# Patient Record
Sex: Female | Born: 1935 | Race: White | Hispanic: No | Smoking: Never smoker
Health system: Southern US, Community
[De-identification: ages and names within clinical notes are randomized; demographics above are authoritative.]

## PROBLEM LIST (undated history)

## (undated) DIAGNOSIS — N813 Complete uterovaginal prolapse: Secondary | ICD-10-CM

## (undated) DIAGNOSIS — E871 Hypo-osmolality and hyponatremia: Secondary | ICD-10-CM

## (undated) DIAGNOSIS — IMO0002 Reserved for concepts with insufficient information to code with codable children: Secondary | ICD-10-CM

## (undated) DIAGNOSIS — M1990 Unspecified osteoarthritis, unspecified site: Secondary | ICD-10-CM

## (undated) DIAGNOSIS — I10 Essential (primary) hypertension: Secondary | ICD-10-CM

## (undated) DIAGNOSIS — M810 Age-related osteoporosis without current pathological fracture: Secondary | ICD-10-CM

## (undated) HISTORY — PX: NO PAST SURGERIES: SHX2092

## (undated) HISTORY — DX: Complete uterovaginal prolapse: N81.3

## (undated) HISTORY — DX: Reserved for concepts with insufficient information to code with codable children: IMO0002

## (undated) HISTORY — DX: Unspecified osteoarthritis, unspecified site: M19.90

## (undated) HISTORY — DX: Essential (primary) hypertension: I10

## (undated) HISTORY — DX: Hypo-osmolality and hyponatremia: E87.1

## (undated) HISTORY — DX: Age-related osteoporosis without current pathological fracture: M81.0

---

## 2005-03-15 ENCOUNTER — Ambulatory Visit: Payer: Self-pay | Admitting: Internal Medicine

## 2006-03-28 ENCOUNTER — Ambulatory Visit: Payer: Self-pay | Admitting: Internal Medicine

## 2006-07-29 ENCOUNTER — Ambulatory Visit: Payer: Self-pay | Admitting: Unknown Physician Specialty

## 2006-09-18 ENCOUNTER — Emergency Department: Payer: Self-pay | Admitting: Emergency Medicine

## 2006-12-04 ENCOUNTER — Ambulatory Visit: Payer: Self-pay | Admitting: Internal Medicine

## 2006-12-18 ENCOUNTER — Ambulatory Visit: Payer: Self-pay | Admitting: Physician Assistant

## 2007-04-16 ENCOUNTER — Ambulatory Visit: Payer: Self-pay | Admitting: Internal Medicine

## 2008-04-19 ENCOUNTER — Ambulatory Visit: Payer: Self-pay | Admitting: Obstetrics and Gynecology

## 2009-05-05 ENCOUNTER — Ambulatory Visit: Payer: Self-pay | Admitting: Internal Medicine

## 2009-12-06 ENCOUNTER — Ambulatory Visit: Payer: Self-pay | Admitting: Ophthalmology

## 2009-12-07 ENCOUNTER — Ambulatory Visit: Payer: Self-pay | Admitting: Internal Medicine

## 2009-12-15 ENCOUNTER — Ambulatory Visit: Payer: Self-pay | Admitting: Internal Medicine

## 2009-12-26 ENCOUNTER — Ambulatory Visit: Payer: Self-pay | Admitting: Ophthalmology

## 2010-01-31 ENCOUNTER — Ambulatory Visit: Payer: Self-pay | Admitting: Ophthalmology

## 2010-02-06 ENCOUNTER — Ambulatory Visit: Payer: Self-pay | Admitting: Ophthalmology

## 2010-05-11 ENCOUNTER — Ambulatory Visit: Payer: Self-pay | Admitting: Internal Medicine

## 2011-07-05 ENCOUNTER — Ambulatory Visit: Payer: Self-pay | Admitting: Internal Medicine

## 2012-07-07 ENCOUNTER — Ambulatory Visit: Payer: Self-pay | Admitting: Internal Medicine

## 2014-04-20 DIAGNOSIS — M549 Dorsalgia, unspecified: Secondary | ICD-10-CM | POA: Insufficient documentation

## 2014-12-14 DIAGNOSIS — I10 Essential (primary) hypertension: Secondary | ICD-10-CM | POA: Insufficient documentation

## 2014-12-14 DIAGNOSIS — D649 Anemia, unspecified: Secondary | ICD-10-CM | POA: Insufficient documentation

## 2014-12-14 DIAGNOSIS — M1990 Unspecified osteoarthritis, unspecified site: Secondary | ICD-10-CM | POA: Insufficient documentation

## 2014-12-14 DIAGNOSIS — E785 Hyperlipidemia, unspecified: Secondary | ICD-10-CM | POA: Insufficient documentation

## 2015-03-23 ENCOUNTER — Ambulatory Visit (INDEPENDENT_AMBULATORY_CARE_PROVIDER_SITE_OTHER): Payer: Medicare Other | Admitting: Obstetrics and Gynecology

## 2015-03-23 ENCOUNTER — Encounter: Payer: Self-pay | Admitting: Obstetrics and Gynecology

## 2015-03-23 VITALS — Ht 59.0 in | Wt 110.3 lb

## 2015-03-23 DIAGNOSIS — Z4689 Encounter for fitting and adjustment of other specified devices: Secondary | ICD-10-CM

## 2015-03-23 DIAGNOSIS — N813 Complete uterovaginal prolapse: Secondary | ICD-10-CM | POA: Diagnosis not present

## 2015-03-23 DIAGNOSIS — N8110 Cystocele, unspecified: Secondary | ICD-10-CM

## 2015-03-23 DIAGNOSIS — N898 Other specified noninflammatory disorders of vagina: Secondary | ICD-10-CM | POA: Diagnosis not present

## 2015-03-23 DIAGNOSIS — IMO0002 Reserved for concepts with insufficient information to code with codable children: Secondary | ICD-10-CM

## 2015-03-23 NOTE — Progress Notes (Signed)
Patient ID: Yvette [name], female   DOB: 07/28/1924, 81 y.o.   MRN: [id number] Chief complaint: 1.  Pessary maintenance. 2.  Procidentia with traction cystocele.   3 month pessary check Estrace - twice a week trimo san once week NO vb,vd, Slight odor  OBJECTIVE: BP   Ht  (1.499 m)  Wt 110 lb 4.8 oz (50.032 kg)  BMI 22.27 kg/m2 Patient has white coat syndrome so blood pressures are not taken in the office. bp at home- 139/78 72                143/79 63                  144/83 66  Abdomen: Soft, nontender. Pelvic exam: External genitalia-normal BUS-normal. Vagina-mild atrophy; Slight posterior vaginal erosion 2 x 3 cm; traction cystocele present. Cervix-no lesions. Uterus-normal size and shape. Adnexa-nonpalpable, nontender. External rectal exam-normal.  PROCEDURE: Pessary is removed, cleaned and replaced.  IMPRESSION: 1.  Normal pessary check. 2.  Gellhorn Pessary used for procidentia and traction cystocele 3.  Very mild erosion noted  PLAN: 1.  Continue with current therapy with trauma San and Premarin cream. 2.  Return in 3 months for pessary check or as needed.  If vaginal bleeding, discharge, or odor develops.

## 2015-03-25 DIAGNOSIS — IMO0002 Reserved for concepts with insufficient information to code with codable children: Secondary | ICD-10-CM | POA: Insufficient documentation

## 2015-03-25 DIAGNOSIS — M810 Age-related osteoporosis without current pathological fracture: Secondary | ICD-10-CM | POA: Insufficient documentation

## 2015-03-25 DIAGNOSIS — N813 Complete uterovaginal prolapse: Secondary | ICD-10-CM | POA: Insufficient documentation

## 2015-03-31 ENCOUNTER — Ambulatory Visit: Payer: Self-pay | Admitting: Obstetrics and Gynecology

## 2015-06-21 ENCOUNTER — Ambulatory Visit (INDEPENDENT_AMBULATORY_CARE_PROVIDER_SITE_OTHER): Payer: Medicare Other | Admitting: Obstetrics and Gynecology

## 2015-06-21 ENCOUNTER — Encounter: Payer: Self-pay | Admitting: Obstetrics and Gynecology

## 2015-06-21 VITALS — Ht 59.0 in | Wt 110.2 lb

## 2015-06-21 DIAGNOSIS — Z4689 Encounter for fitting and adjustment of other specified devices: Secondary | ICD-10-CM | POA: Diagnosis not present

## 2015-06-21 DIAGNOSIS — N8110 Cystocele, unspecified: Secondary | ICD-10-CM | POA: Diagnosis not present

## 2015-06-21 DIAGNOSIS — IMO0002 Reserved for concepts with insufficient information to code with codable children: Secondary | ICD-10-CM

## 2015-06-21 DIAGNOSIS — N813 Complete uterovaginal prolapse: Secondary | ICD-10-CM | POA: Diagnosis not present

## 2015-06-21 NOTE — Patient Instructions (Signed)
1. Continue with current therapy with [facility] and Premarin cream. 2. Return in 3 months for pessary check or as needed. If vaginal bleeding, discharge, or odor develops.

## 2015-06-21 NOTE — Progress Notes (Signed)
Patient ID: Yvette MunroeReva K Horkey, female   DOB: 1924-03-12, 81 y.o.   MRN: [id number]   Chief complaint: 1. Pessary maintenance. 2. Procidentia with traction cystocele.   3 month pessary check Estrace - twice a week trimo san once week NO vb,vd, Slight odor  OBJECTIVE: BP  Ht 4\' 11"  (1.499 m)  Wt 110 lb 4.8 oz (50.032 kg)  BMI 22.27 kg/m2 Patient has white coat syndrome so blood pressures are not taken in the office.  Bp log   10/21 117 79  55   10/22 125 81  80   10/23 135 76   77     10/24  145 83   79   Abdomen: Soft, nontender. Pelvic exam: External genitalia-normal BUS-normal. Vagina-mild atrophy; traction cystocele present. Cervix-no lesions. Uterus-normal size and shape. Adnexa-nonpalpable, nontender. External rectal exam-normal.  PROCEDURE: Pessary is removed, cleaned and replaced.  IMPRESSION: 1. Normal pessary check. 2. Gellhorn Pessary used for procidentia and traction cystocele  PLAN: 1. [facility] with [facility] and Premarin cream. 2. Return in 3 months for pessary check or as needed. If vaginal bleeding, discharge, or odor develops.  Herold HarmsMartin A Schylar Allard, MD  Note: This dictation was prepared with Dragon dictation along with smaller phrase technology. Any transcriptional errors that result from this process are unintentional.

## 2015-09-21 ENCOUNTER — Encounter: Payer: Self-pay | Admitting: Obstetrics and Gynecology

## 2015-09-21 ENCOUNTER — Ambulatory Visit (INDEPENDENT_AMBULATORY_CARE_PROVIDER_SITE_OTHER): Payer: Medicare Other | Admitting: Obstetrics and Gynecology

## 2015-09-21 VITALS — Ht 59.0 in | Wt 112.1 lb

## 2015-09-21 DIAGNOSIS — N8110 Cystocele, unspecified: Secondary | ICD-10-CM

## 2015-09-21 DIAGNOSIS — Z4689 Encounter for fitting and adjustment of other specified devices: Secondary | ICD-10-CM | POA: Diagnosis not present

## 2015-09-21 DIAGNOSIS — N813 Complete uterovaginal prolapse: Secondary | ICD-10-CM

## 2015-09-21 DIAGNOSIS — IMO0002 Reserved for concepts with insufficient information to code with codable children: Secondary | ICD-10-CM

## 2015-09-21 DIAGNOSIS — B3731 Acute candidiasis of vulva and vagina: Secondary | ICD-10-CM

## 2015-09-21 DIAGNOSIS — B373 Candidiasis of vulva and vagina: Secondary | ICD-10-CM

## 2015-09-21 MED ORDER — NYSTATIN-TRIAMCINOLONE 100000-0.1 UNIT/GM-% EX CREA: 1.0000 "application " | TOPICAL_CREAM | Freq: Two times a day (BID) | CUTANEOUS | Status: DC

## 2015-09-21 MED ORDER — NYSTATIN 100000 UNIT/GM EX OINT: 1.0000 "application " | TOPICAL_OINTMENT | Freq: Two times a day (BID) | CUTANEOUS | Status: DC

## 2015-09-21 MED ORDER — TRIAMCINOLONE ACETONIDE 0.1 % EX OINT: 1.0000 "application " | TOPICAL_OINTMENT | Freq: Two times a day (BID) | CUTANEOUS | Status: DC

## 2015-09-21 NOTE — Addendum Note (Signed)
Addended by: Marchelle Folks on: 09/21/2015 04:33 PM   Modules accepted: Orders

## 2015-09-21 NOTE — Patient Instructions (Signed)
1.  Continue using Premarin cream and TRIMO San gel as directed Weekly. 2.  Nystatin/triamcinolone cream is to be applied to the vulva and perianal area twice a day for 7-10 days. 3.  Return in 3 months for follow-up.

## 2015-09-21 NOTE — Progress Notes (Signed)
Patient ID: Yvette [name], female   DOB: [birth year], 82 y.o.   MRN: [id number] 3 month f/u on pessary -gell horn- lv 05/2015 No vb or vd trimosan once a week Estrace twice a week bp at home 133/69                     140/78                     141/75                    127/77

## 2015-09-21 NOTE — Progress Notes (Signed)
Chief complaint: 1. Pessary maintenance. 2. Procidentia with traction cystocele.   3 month pessary check Estrace - twice a week trimo san once week NO vb,vd, Slight odor  OBJECTIVE: Ht  (1.499 m)  Wt 112 lb 1.6 oz (50.848 kg)  BMI 22.63 kg/m2  Patient has white coat syndrome so blood pressures are not taken in the office.  Abdomen: Soft, nontender. Pelvic exam: External genitalia-normal BUS-normal. Vagina-mild atrophy; traction cystocele present. Cervix-no lesions; 7 mm posterior cervix polyp Uterus-Not examined Adnexa-Not examined External rectal exam-Symmetric hyperemia with satellite lesions possibly consistent with monilia  PROCEDURE: Pessary is removed, cleaned and replaced.  IMPRESSION: 1. Normal pessary check. 2. Gellhorn Pessary used for procidentia and traction cystocele 3.  Monilia vulvitis  PLAN: 1. Continue with current therapy with [facility] and Premarin cream. 2. Return in 3 months for pessary check or as needed. If vaginal bleeding, discharge, or odor develops. 3.  Nystatin/triamcinolone cream to be applied topically to the perianal area twice a day for 7-10 days  Herold Harms, MD  Note: This dictation was prepared with Dragon dictation along with smaller phrase technology. Any transcriptional errors that result from this process are unintentional.

## 2015-12-20 ENCOUNTER — Ambulatory Visit (INDEPENDENT_AMBULATORY_CARE_PROVIDER_SITE_OTHER): Payer: Medicare Other | Admitting: Obstetrics and Gynecology

## 2015-12-20 ENCOUNTER — Encounter: Payer: Self-pay | Admitting: Obstetrics and Gynecology

## 2015-12-20 VITALS — Ht 59.0 in | Wt 114.2 lb

## 2015-12-20 DIAGNOSIS — N898 Other specified noninflammatory disorders of vagina: Secondary | ICD-10-CM

## 2015-12-20 DIAGNOSIS — Z4689 Encounter for fitting and adjustment of other specified devices: Secondary | ICD-10-CM | POA: Diagnosis not present

## 2015-12-20 DIAGNOSIS — N813 Complete uterovaginal prolapse: Secondary | ICD-10-CM

## 2015-12-20 DIAGNOSIS — N8110 Cystocele, unspecified: Secondary | ICD-10-CM

## 2015-12-20 DIAGNOSIS — IMO0002 Reserved for concepts with insufficient information to code with codable children: Secondary | ICD-10-CM

## 2015-12-20 NOTE — Progress Notes (Signed)
Chief complaint: 1. Pessary maintenance. 2. Procidentia with traction cystocele.   3 month pessary check Estrace - twice a week trimo san once week NO vb,vd, Slight odor  OBJECTIVE: BP   Ht 4\' 11"  (1.499 m)  Wt 114 lb 3.2 oz (51.801 kg)  BMI 23.05 kg/m2   Patient has white coat syndrome so blood pressures are not taken in the office.  Home blood pressures: 12/18/2015 145/78. 12/19/2015 126/71. 12/20/2015 144/77  Abdomen: Soft, nontender. Pelvic exam: External genitalia-normal BUS-normal. Vagina-mild atrophy; traction cystocele present. Cervix-no lesions; 7 mm posterior cervix polyp Uterus-Not examined Adnexa-Not examined External rectal exam-Normal  PROCEDURE: Pessary is removed, cleaned and replaced.  IMPRESSION: 1. Normal pessary check. 2. Gellhorn Pessary used for procidentia and traction cystocele   PLAN: 1. Continue with current therapy with [facility] and Premarin cream. 2. Return in 3 months for pessary check or as needed. If vaginal bleeding, discharge, or odor develops.  Prentice DockerMartin A Tyr Franca, MD  A total of 15 minutes were spent face-to-face with the patient during this encounter and over half of that time dealt with counseling and coordination of care.  Note: This dictation was prepared with Dragon dictation along with smaller phrase technology. Any transcriptional errors that result from this process are unintentional.

## 2015-12-20 NOTE — Patient Instructions (Signed)
1.  Return in 3 months for pessary maintenance 

## 2016-01-16 ENCOUNTER — Other Ambulatory Visit: Payer: Self-pay

## 2016-01-16 MED ORDER — ESTRADIOL 0.1 MG/GM VA CREA: 0.2500 | TOPICAL_CREAM | Freq: Every day | VAGINAL | Status: DC

## 2016-03-20 ENCOUNTER — Ambulatory Visit (INDEPENDENT_AMBULATORY_CARE_PROVIDER_SITE_OTHER): Payer: Medicare Other | Admitting: Obstetrics and Gynecology

## 2016-03-20 ENCOUNTER — Encounter: Payer: Self-pay | Admitting: Obstetrics and Gynecology

## 2016-03-20 VITALS — Ht 59.0 in | Wt 110.7 lb

## 2016-03-20 DIAGNOSIS — IMO0002 Reserved for concepts with insufficient information to code with codable children: Secondary | ICD-10-CM

## 2016-03-20 DIAGNOSIS — N813 Complete uterovaginal prolapse: Secondary | ICD-10-CM

## 2016-03-20 DIAGNOSIS — Z4689 Encounter for fitting and adjustment of other specified devices: Secondary | ICD-10-CM | POA: Diagnosis not present

## 2016-03-20 DIAGNOSIS — N8110 Cystocele, unspecified: Secondary | ICD-10-CM | POA: Diagnosis not present

## 2016-03-20 NOTE — Progress Notes (Signed)
       Chief complaint: 1. Pessary maintenance. 2. Procidentia with traction cystocele.   3 month pessary check Estrace - twice a week trimo san once week NO vb,vd, Slight odor  OBJECTIVE: BP   Ht 4\' 11"  (1.499 m)  Wt 114 lb 3.2 oz (51.801 kg)  BMI 23.05 kg/m2   Patient has white coat syndrome so blood pressures are not taken in the office.  Home blood pressures: 134/82 54 160/85 98 127/78/ 49   Abdomen: Soft, nontender. Pelvic exam: External genitalia-normal BUS-normal. Vagina-mild atrophy; traction cystocele present. Cervix-no lesions; 7 mm posterior cervix polyp Uterus-Not examined Adnexa-Not examined External rectal exam-Normal  PROCEDURE: Pessary is removed, cleaned and replaced.  IMPRESSION: 1. Normal pessary check. 2. Gellhorn Pessary used for procidentia and traction cystocele   PLAN: 1. Continue with current therapy with Trimo San and Estrace cream weekly 2. Return in 3 months for pessary check or as needed. If vaginal bleeding, discharge, or odor develops.  A total of 15 minutes were spent face-to-face with the patient during this encounter and over half of that time dealt with counseling and coordination of care.  Herold Harms, MD  Note: This dictation was prepared with Dragon dictation along with smaller phrase technology. Any transcriptional errors that result from this process are unintentional.

## 2016-03-20 NOTE — Patient Instructions (Signed)
1. Continue Trimo San gel weekly 2. Continue Estrace cream once a week 3. Return in 3 months for pessary maintenance

## 2016-04-29 ENCOUNTER — Inpatient Hospital Stay
Admission: EM | Admit: 2016-04-29 | Discharge: 2016-05-03 | DRG: 641 | Disposition: A | Discharge status: Home / Self Care | Payer: Medicare Other | Attending: Internal Medicine | Admitting: Internal Medicine

## 2016-04-29 ENCOUNTER — Encounter: Payer: Self-pay | Admitting: Emergency Medicine

## 2016-04-29 DIAGNOSIS — Z9181 History of falling: Secondary | ICD-10-CM

## 2016-04-29 DIAGNOSIS — M810 Age-related osteoporosis without current pathological fracture: Secondary | ICD-10-CM | POA: Diagnosis present

## 2016-04-29 DIAGNOSIS — Z7983 Long term (current) use of bisphosphonates: Secondary | ICD-10-CM | POA: Diagnosis not present

## 2016-04-29 DIAGNOSIS — T502X5A Adverse effect of carbonic-anhydrase inhibitors, benzothiadiazides and other diuretics, initial encounter: Secondary | ICD-10-CM | POA: Diagnosis present

## 2016-04-29 DIAGNOSIS — E876 Hypokalemia: Secondary | ICD-10-CM | POA: Diagnosis present

## 2016-04-29 DIAGNOSIS — R531 Weakness: Secondary | ICD-10-CM

## 2016-04-29 DIAGNOSIS — Z79899 Other long term (current) drug therapy: Secondary | ICD-10-CM

## 2016-04-29 DIAGNOSIS — E871 Hypo-osmolality and hyponatremia: Principal | ICD-10-CM | POA: Diagnosis present

## 2016-04-29 DIAGNOSIS — Z79890 Hormone replacement therapy (postmenopausal): Secondary | ICD-10-CM | POA: Diagnosis not present

## 2016-04-29 DIAGNOSIS — I4891 Unspecified atrial fibrillation: Secondary | ICD-10-CM | POA: Diagnosis present

## 2016-04-29 DIAGNOSIS — I10 Essential (primary) hypertension: Secondary | ICD-10-CM | POA: Diagnosis present

## 2016-04-29 DIAGNOSIS — N390 Urinary tract infection, site not specified: Secondary | ICD-10-CM | POA: Diagnosis present

## 2016-04-29 DIAGNOSIS — E860 Dehydration: Secondary | ICD-10-CM | POA: Diagnosis present

## 2016-04-29 DIAGNOSIS — F419 Anxiety disorder, unspecified: Secondary | ICD-10-CM | POA: Diagnosis present

## 2016-04-29 DIAGNOSIS — N3001 Acute cystitis with hematuria: Secondary | ICD-10-CM | POA: Diagnosis present

## 2016-04-29 DIAGNOSIS — Z803 Family history of malignant neoplasm of breast: Secondary | ICD-10-CM | POA: Diagnosis not present

## 2016-04-29 LAB — URINALYSIS COMPLETE WITH MICROSCOPIC (ARMC ONLY)
BILIRUBIN URINE: NEGATIVE
GLUCOSE, UA: NEGATIVE mg/dL
Nitrite: NEGATIVE
PH: 7 (ref 5.0–8.0)
Protein, ur: NEGATIVE mg/dL
Specific Gravity, Urine: 1.008 (ref 1.005–1.030)

## 2016-04-29 LAB — BASIC METABOLIC PANEL
ANION GAP: 11 (ref 5–15)
BUN: 12 mg/dL (ref 6–20)
CALCIUM: 9.1 mg/dL (ref 8.9–10.3)
CHLORIDE: 77 mmol/L — AB (ref 101–111)
CO2: 28 mmol/L (ref 22–32)
Creatinine, Ser: 0.5 mg/dL (ref 0.44–1.00)
GFR calc non Af Amer: >60 mL/min (ref >60)
GLUCOSE: 105 mg/dL — AB (ref 65–99)
Potassium: 3 mmol/L — ABNORMAL LOW (ref 3.5–5.1)
Sodium: 116 mmol/L — CL (ref 135–145)

## 2016-04-29 LAB — CBC WITH DIFFERENTIAL/PLATELET
Basophils Absolute: 0 10*3/uL (ref 0–0.1)
Basophils Relative: 0 %
Eosinophils Absolute: 0 10*3/uL (ref 0–0.7)
Eosinophils Relative: 0 %
HEMATOCRIT: 46.9 % (ref 35.0–47.0)
HEMOGLOBIN: 16.1 g/dL — AB (ref 12.0–16.0)
LYMPHS ABS: 1.1 10*3/uL (ref 1.0–3.6)
MCH: 35.6 pg — AB (ref 26.0–34.0)
MCHC: 34.3 g/dL (ref 32.0–36.0)
MCV: 94.3 fL (ref 80.0–100.0)
MONO ABS: 0.9 10*3/uL (ref 0.2–0.9)
NEUTROS ABS: 5.4 10*3/uL (ref 1.4–6.5)
Platelets: 215 10*3/uL (ref 150–440)
RBC: 4.51 MIL/uL (ref 3.80–5.20)
RDW: 12.8 % (ref 11.5–14.5)
WBC: 7.5 10*3/uL (ref 3.6–11.0)

## 2016-04-29 LAB — SODIUM
SODIUM: 118 mmol/L — AB (ref 135–145)
SODIUM: 118 mmol/L — AB (ref 135–145)
Sodium: 118 mmol/L — CL (ref 135–145)
Sodium: 117 mmol/L — CL (ref 135–145)
Sodium: 116 mmol/L — CL (ref 135–145)

## 2016-04-29 LAB — TROPONIN I: Troponin I: <0.03 ng/mL (ref <0.03)

## 2016-04-29 MED ORDER — ONDANSETRON HCL 4 MG PO TABS: 4.0000 mg | ORAL_TABLET | Freq: Four times a day (QID) | ORAL | Status: DC | PRN

## 2016-04-29 MED ORDER — SODIUM CHLORIDE 0.9 % IV SOLN
INTRAVENOUS | Status: DC
  Administered 2016-04-29 – 2016-05-01 (×3): via INTRAVENOUS

## 2016-04-29 MED ORDER — SENNOSIDES-DOCUSATE SODIUM 8.6-50 MG PO TABS: 1.0000 | ORAL_TABLET | Freq: Every evening | ORAL | Status: DC | PRN

## 2016-04-29 MED ORDER — POTASSIUM CHLORIDE CRYS ER 20 MEQ PO TBCR
20.0000 meq | EXTENDED_RELEASE_TABLET | Freq: Once | ORAL | Status: AC
  Administered 2016-04-29: 20 meq via ORAL
  Filled 2016-04-29: qty 1

## 2016-04-29 MED ORDER — ONDANSETRON HCL 4 MG/2ML IJ SOLN: 4.0000 mg | Freq: Four times a day (QID) | INTRAMUSCULAR | Status: DC | PRN

## 2016-04-29 MED ORDER — LOSARTAN POTASSIUM 50 MG PO TABS
100.0000 mg | ORAL_TABLET | Freq: Every day | ORAL | Status: DC
  Administered 2016-04-29 – 2016-05-03 (×5): 100 mg via ORAL
  Filled 2016-04-29 (×5): qty 2

## 2016-04-29 MED ORDER — SODIUM CHLORIDE 0.9 % IV BOLUS (SEPSIS)
1000.0000 mL | Freq: Once | INTRAVENOUS | Status: AC
  Administered 2016-04-29: 1000 mL via INTRAVENOUS

## 2016-04-29 MED ORDER — ENOXAPARIN SODIUM 40 MG/0.4ML ~~LOC~~ SOLN
40.0000 mg | Freq: Every day | SUBCUTANEOUS | Status: DC
  Administered 2016-04-29 – 2016-05-01 (×3): 40 mg via SUBCUTANEOUS
  Filled 2016-04-29 (×3): qty 0.4

## 2016-04-29 MED ORDER — ALENDRONATE SODIUM 70 MG PO TABS: 70.0000 mg | ORAL_TABLET | ORAL | Status: DC

## 2016-04-29 MED ORDER — ENSURE ENLIVE PO LIQD
237.0000 mL | Freq: Two times a day (BID) | ORAL | Status: DC
  Administered 2016-04-29 – 2016-05-02 (×8): 237 mL via ORAL

## 2016-04-29 MED ORDER — ACETAMINOPHEN 650 MG RE SUPP: 650.0000 mg | Freq: Four times a day (QID) | RECTAL | Status: DC | PRN

## 2016-04-29 MED ORDER — SODIUM CHLORIDE 0.9% FLUSH
3.0000 mL | Freq: Two times a day (BID) | INTRAVENOUS | Status: DC
  Administered 2016-05-01 – 2016-05-03 (×4): 3 mL via INTRAVENOUS

## 2016-04-29 MED ORDER — ACETAMINOPHEN 325 MG PO TABS: 650.0000 mg | ORAL_TABLET | Freq: Four times a day (QID) | ORAL | Status: DC | PRN

## 2016-04-29 MED ORDER — CLONIDINE HCL 0.1 MG PO TABS
0.1000 mg | ORAL_TABLET | Freq: Every day | ORAL | Status: DC
  Administered 2016-04-29: 0.1 mg via ORAL
  Filled 2016-04-29 (×2): qty 1

## 2016-04-29 MED ORDER — CALCIUM CITRATE-VITAMIN D 500-400 MG-UNIT PO CHEW
1.0000 | CHEWABLE_TABLET | Freq: Every day | ORAL | Status: DC
  Administered 2016-04-29 – 2016-05-03 (×5): 1 via ORAL
  Filled 2016-04-29 (×5): qty 1

## 2016-04-29 MED ORDER — POTASSIUM CHLORIDE CRYS ER 20 MEQ PO TBCR
40.0000 meq | EXTENDED_RELEASE_TABLET | Freq: Once | ORAL | Status: AC
  Administered 2016-04-29: 40 meq via ORAL
  Filled 2016-04-29: qty 2

## 2016-04-29 MED ORDER — DEXTROSE 5 % IV SOLN
1.0000 g | INTRAVENOUS | Status: DC
  Administered 2016-04-29: 21:00:00 via INTRAVENOUS
  Administered 2016-04-29 – 2016-04-30 (×2): 1 g via INTRAVENOUS
  Filled 2016-04-29 (×4): qty 10

## 2016-04-29 MED ORDER — ASPIRIN EC 81 MG PO TBEC
81.0000 mg | DELAYED_RELEASE_TABLET | Freq: Every day | ORAL | Status: DC
  Administered 2016-04-29 – 2016-05-01 (×3): 81 mg via ORAL
  Filled 2016-04-29 (×3): qty 1

## 2016-04-29 MED ORDER — MAGNESIUM SULFATE 2 GM/50ML IV SOLN
2.0000 g | Freq: Once | INTRAVENOUS | Status: AC
  Administered 2016-04-29: 2 g via INTRAVENOUS
  Filled 2016-04-29: qty 50

## 2016-04-29 MED ORDER — TIMOLOL MALEATE 0.5 % OP SOLN
1.0000 [drp] | Freq: Every day | OPHTHALMIC | Status: DC
  Administered 2016-04-29 – 2016-05-02 (×4): 1 [drp] via OPHTHALMIC
  Filled 2016-04-29: qty 5

## 2016-04-29 NOTE — Progress Notes (Signed)
Spoke with dr. Thedore Minssingh to make aware that sodium is trending down, back to 116. Per md place order for repeat sodium for 1900 and call with results.

## 2016-04-29 NOTE — ED Provider Notes (Signed)
[facility] Emergency Department Provider Note   ____________________________________________   First MD Initiated Contact with Patient 04/29/16 0141     (approximate)  I have reviewed the triage vital signs and the nursing notes.   HISTORY  Chief Complaint Weakness and Anxiety    HPI Yvette [name] is a 82 y.o. female who comes into the hospital today with elevated blood pressure at home. She reports that this evening it was so high. She reports it was in the 200s over 114 and she took it several times. She reports that it was 195/117 so her son decided to call EMS. She reports that she takes 3 different kinds of medicines for her blood pressure, 2 in the morning and one at night. The patient denies missing any medications. She reports that she was concerned she may have a stroke so she came in for evaluation. The patient denies any chest pain, headache, blurred vision, shortness of breath, nausea or vomiting. She thinks her elevated blood pressures due to stress. Her husband has been sick and he came from aspirin yesterday. She reports that he is not doing well and she is unable to care for him. She reports that today her legs have given out on her and she fell onto her bottom on a chair. The patient reports that she feels well now but again is here for evaluation.   Past Medical History:  Diagnosis Date  . Arthritis   . Cystocele    midline  . Hypertension   . Osteoporosis   . Procidentia of uterus     Patient Active Problem List   Diagnosis Date Noted  . 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 Cystocele 03/25/2015  . Absolute anemia 12/14/2014  . Arthritis 12/14/2014  . HLD (hyperlipidemia) 12/14/2014  . BP (high blood pressure) 12/14/2014  . Back ache 04/20/2014    Past Surgical History:  Procedure Laterality Date  . NO PAST SURGERIES      Prior to Admission medications   Medication Sig Start Date  End Date Taking? Authorizing Provider  alendronate (FOSAMAX) 70 MG tablet take 1 tablet by mouth every week 03/08/16   Historical Provider, MD  calcium citrate-vitamin D (CITRACAL+D) 315-200 MG-UNIT tablet Take by mouth.    Historical Provider, MD  cloNIDine (CATAPRES) 0.1 MG tablet Take 0.1 mg by mouth daily.     Historical Provider, MD  estradiol (ESTRACE VAGINAL) 0.1 MG/GM vaginal cream  06/22/14   Historical Provider, MD  furosemide (LASIX) 40 MG tablet  06/04/14   Historical Provider, MD  hydrochlorothiazide (HYDRODIURIL) 25 MG tablet take 1 tablet by mouth once daily 02/13/16   Historical Provider, MD  losartan (COZAAR) 100 MG tablet Take 100 mg by mouth daily.    Historical Provider, MD  OXYQUINOLONE SULFATE VAGINAL (TRIMO-SAN) 0.025 % GEL Place vaginally.    Historical Provider, MD  potassium chloride (K-DUR) 10 MEQ tablet  06/04/14   Historical Provider, MD  timolol (TIMOPTIC) 0.5 % ophthalmic solution  07/29/14   Historical Provider, MD    Allergies Amlodipine and Hydrocodone-acetaminophen  Family History  Problem Relation Age of Onset  . Breast cancer Maternal Aunt   . Breast cancer Paternal Aunt   . Ovarian cancer Neg Hx   . Diabetes Neg Hx     Social History Social History  Substance Use Topics  . Smoking status: Never Smoker  . Smokeless tobacco: Never Used  . Alcohol use No    Review of  Systems Constitutional: No fever/chills Eyes: No visual changes. ENT: No sore throat. Cardiovascular: Denies chest pain. Respiratory: Denies shortness of breath. Gastrointestinal: No abdominal pain.  No nausea, no vomiting.  No diarrhea.  No constipation. Genitourinary: Negative for dysuria. Musculoskeletal: Negative for back pain. Skin: Negative for rash. Neurological: Negative for headaches, focal weakness or numbness.  10-point ROS otherwise negative.  ____________________________________________   PHYSICAL EXAM:  VITAL SIGNS: ED Triage Vitals  Enc Vitals Group     BP  04/29/16 0027 (!) 153/91     Pulse Rate 04/29/16 0027 74     Resp 04/29/16 0027 20     Temp 04/29/16 0027 97.5 F (36.4 C)     Temp Source 04/29/16 0027 Oral     SpO2 04/29/16 0027 100 %     Weight 04/29/16 0028 111 lb (50.3 kg)     Height 04/29/16 0028 4\' 11"  (1.499 m)     Head Circumference --      Peak Flow --      Pain Score 04/29/16 0028 0     Pain Loc --      Pain Edu? --      Excl. in GC? --     Constitutional: Alert and oriented. Well appearing and in no acute distress. Eyes: Conjunctivae are normal. PERRL. EOMI. Head: Atraumatic. Nose: No congestion/rhinnorhea. Mouth/Throat: Mucous membranes are moist.  Oropharynx non-erythematous. Cardiovascular: Normal rate, regular rhythm. Grossly normal heart sounds.  Good peripheral circulation. Respiratory: Normal respiratory effort.  No retractions. Lungs CTAB. Gastrointestinal: Soft and nontender. No distention.  Musculoskeletal: No lower extremity tenderness nor edema.   Neurologic:  Normal speech and language.  Skin:  Skin is warm, dry and intact. Marland Kitchen. Psychiatric: Mood and affect are normal.  ____________________________________________   LABS (all labs ordered are listed, but only abnormal results are displayed)  Labs Reviewed  URINALYSIS COMPLETEWITH MICROSCOPIC ([facility] ONLY) - Abnormal; Notable for the following:       Result Value   Color, Urine YELLOW (*)    APPearance CLOUDY (*)    Ketones, ur TRACE (*)    Hgb urine dipstick 1+ (*)    Leukocytes, UA 3+ (*)    Bacteria, UA MANY (*)    Squamous Epithelial / LPF 6-30 (*)    All other components within normal limits  BASIC METABOLIC PANEL - Abnormal; Notable for the following:    Sodium 116 (*)    Potassium 3.0 (*)    Chloride 77 (*)    Glucose, Bld 105 (*)    All other components within normal limits  CBC WITH DIFFERENTIAL/PLATELET - Abnormal; Notable for the following:    Hemoglobin 16.1 (*)    MCH 35.6 (*)    All other components within normal limits  URINE  CULTURE  TROPONIN I   ____________________________________________  EKG  ED ECG REPORT I, Rebecka ApleyWebster,  Weslyn Holsonback P, the attending physician, personally viewed and interpreted this ECG.   Date: 04/29/2016  EKG Time: 034  Rate: 68  Rhythm: atrial fibrillation, rate 68  Axis: normal  Intervals:none  ST&T Change: none  ____________________________________________  RADIOLOGY  none ____________________________________________   PROCEDURES  Procedure(s) performed: None  Procedures  Critical Care performed: No  ____________________________________________   INITIAL IMPRESSION / ASSESSMENT AND PLAN / ED COURSE  Pertinent labs & imaging results that were available during my care of the patient were reviewed by me and considered in my medical decision making (see chart for details).  This is an 82 year old female who comes  into the hospital today with some elevated blood pressure. She is also been having some weakness and feeling unwell for the past 2 weeks. We will check some blood work on the patient as well as some urine. The patient's blood pressure at this time is improved. It is in the 140s over 90s. I will reassess the patient after I received the results of her blood work.  Clinical Course   The patient's blood work returned and the patient has sodium of 116. That would be a cause of her weakness. I will give the patient a liter of normal saline. It also appears that the patient has some urinary tract infection with many bacteria. I will give the patient a dose of ceftriaxone. She will be admitted to the hospitalist service for further treatment and evaluation of her symptoms.  ____________________________________________   FINAL CLINICAL IMPRESSION(S) / ED DIAGNOSES  Final diagnoses:  Hyponatremia  UTI (lower urinary tract infection)  Weakness      NEW MEDICATIONS STARTED DURING THIS VISIT:  New Prescriptions   No medications on file     Note:  This  document was prepared using Dragon voice recognition software and may include unintentional dictation errors.    Rebecka Apley, MD 04/29/16 [phone]

## 2016-04-29 NOTE — Progress Notes (Signed)
Sodium level 118 tonight. Dr. Thedore MinsSingh notified with a new order for nsure two times a day and continue to monitor.

## 2016-04-29 NOTE — ED Notes (Signed)
Sodium 116 reported to Dr Zenda AlpersWebster

## 2016-04-29 NOTE — ED Triage Notes (Signed)
Pt arrived via EMS from home with c/o general weakness x 2 weeks, worse today after husband returning home from hospital; he was discharged home with kidney failure and "he's not doing well"; pt says she is normally able to get around the house without assistance but in the last few days feels too weak to do so; denies any chest pain, shortness of breath, cough, fever, urinary s/s, bowel issues; alert and oriented x 3; talking in complete coherent sentences

## 2016-04-29 NOTE — Progress Notes (Signed)
Dr. Renae Glosswieting on the floor, md was made aware of the new onset of afib no cardiologist. Per md will assess patient and place orders as needed.

## 2016-04-29 NOTE — Progress Notes (Addendum)
Alert and oriented. Na levels have not been improving. Na went from 117 to 116. Continuous Ns running at 50 ml/hr. Recent K 3.0, Received K PO. Pt has New onset A-fib and now has cardiology consult.

## 2016-04-29 NOTE — ED Notes (Signed)
EMS reports pt thinks she's having a stroke; pt's husband just discharged from hospital and pt is overwhelmed; pt reports feeling stressed since her husband has been in the hospital since Wednesday; BP 161/94; no chest pain, no SOB, denies any pain;

## 2016-04-29 NOTE — ED Notes (Signed)
Admitting MD at bedside.

## 2016-04-29 NOTE — Consult Note (Addendum)
Date: 04/29/2016                  Patient Name:  Yvette [name]  MRN: [id number]  DOB: [birth year]  Age / Sex: 82 y.o., female         PCP: Lynnea Ferrier, MD                 Service Requesting Consult: Internal medicine                 Reason for Consult: hyponatremia            History of Present Illness: Patient is a 82 y.o. female with medical problems of Hypertension, osteoarthritis, osteoporosis, who was admitted to [facility] on 04/29/2016 for evaluation of several days of generalized weakness, anxiety, poor appetite.  In the ER, she was noted to have a low sodium of 116, low potassium of 3.0 She was admitted for further evaluation and management Patient reports that she has been falling at home most recent fall was on Saturday Her husband was hospitalized recently and she has been stressed about it She reports her blood pressure was in the 200s on Saturday and that's when her son called 911 to get her evaluated    Medications: Outpatient medications: Prescriptions Prior to Admission  Medication Sig Dispense Refill Last Dose  . alendronate (FOSAMAX) 70 MG tablet take 1 tablet by mouth every week   Past Week at Unknown time  . calcium citrate-vitamin D (CITRACAL+D) 315-200 MG-UNIT tablet Take 1 tablet by mouth daily.    04/28/2016 at Unknown time  . cloNIDine (CATAPRES) 0.1 MG tablet Take 0.1 mg by mouth daily.    04/28/2016 at Unknown time  . estradiol (ESTRACE) 0.1 MG/GM vaginal cream Place 1 Applicatorful vaginally once a week.   Past Week at Unknown time  . hydrochlorothiazide (HYDRODIURIL) 25 MG tablet take 1 tablet by mouth once daily   04/28/2016 at Unknown time  . losartan (COZAAR) 100 MG tablet Take 100 mg by mouth daily.   04/28/2016 at Unknown time  . potassium chloride (K-DUR) 10 MEQ tablet Take 10 mEq by mouth daily.    04/28/2016 at Unknown time  . timolol (TIMOPTIC) 0.5 % ophthalmic solution Place 1 drop into both eyes daily.    04/28/2016 at Unknown time  . furosemide (LASIX) 40  MG tablet Take 40 mg by mouth as needed for fluid or edema.    Taking    Current medications: Current Facility-Administered Medications  Medication Dose Route Frequency Provider Last Rate Last Dose  . 0.9 %  sodium chloride infusion   Intravenous Continuous Alford Highland, MD 50 mL/hr at 04/29/16 [phone]    . acetaminophen (TYLENOL) tablet 650 mg  650 mg Oral Q6H PRN Ihor Austin, MD       Or  . acetaminophen (TYLENOL) suppository 650 mg  650 mg Rectal Q6H PRN Ihor Austin, MD      . aspirin EC tablet 81 mg  81 mg Oral Daily Alford Highland, MD      . calcium citrate-vitamin D 500-400 MG-UNIT per chewable tablet 1 tablet  1 tablet Oral Daily Ihor Austin, MD   1 tablet at 04/29/16 1017  . cefTRIAXone (ROCEPHIN) 1 g in dextrose 5 % 50 mL IVPB  1 g Intravenous Q24H Rebecka Apley, MD   Stopped at 04/29/16 0250  . cloNIDine (CATAPRES) tablet 0.1 mg  0.1 mg Oral Daily Ihor Austin, MD      . enoxaparin (  LOVENOX) injection 40 mg  40 mg Subcutaneous Daily Pavan Pyreddy, MD   40 mg at 04/29/16 1018  . feeding supplement (ENSURE ENLIVE) (ENSURE ENLIVE) liquid 237 mL  237 mL Oral BID BM Alford Highlandichard Wieting, MD      . losartan (COZAAR) tablet 100 mg  100 mg Oral Daily Pavan Pyreddy, MD   100 mg at 04/29/16 1017  . magnesium sulfate IVPB 2 g 50 mL  2 g Intravenous Once Alford Highlandichard Wieting, MD      . ondansetron [facility](ZOFRAN) tablet 4 mg  4 mg Oral Q6H PRN Ihor AustinPavan Pyreddy, MD       Or  . ondansetron (ZOFRAN) injection 4 mg  4 mg Intravenous Q6H PRN Pavan Pyreddy, MD      . potassium chloride SA (K-DUR,KLOR-CON) CR tablet 40 mEq  40 mEq Oral Once Alford Highlandichard Wieting, MD      . senna-docusate (Senokot-S) tablet 1 tablet  1 tablet Oral QHS PRN Pavan Pyreddy, MD      . sodium chloride flush (NS) 0.9 % injection 3 mL  3 mL Intravenous Q12H Pavan Pyreddy, MD      . timolol (TIMOPTIC) 0.5 % ophthalmic solution 1 drop  1 drop Both Eyes Daily Ihor AustinPavan Pyreddy, MD   1 drop at 04/29/16 1018      Allergies: Allergies  Allergen  Reactions  . Amlodipine Other (See Comments)    edema  . Hydrocodone-Acetaminophen Nausea Only      Past Medical History: Past Medical History:  Diagnosis Date  . Arthritis   . Cystocele    midline  . Hypertension   . Osteoporosis   . Procidentia of uterus      Past Surgical History: Past Surgical History:  Procedure Laterality Date  . NO PAST SURGERIES       Family History: Family History  Problem Relation Age of Onset  . Breast cancer Maternal Aunt   . Breast cancer Paternal Aunt   . Ovarian cancer Neg Hx   . Diabetes Neg Hx      Social History: Social History   Social History  . Marital status: Married    Spouse name: N/A  . Number of children: N/A  . Years of education: N/A   Occupational History  . retired    Social History Main Topics  . Smoking status: Never Smoker  . Smokeless tobacco: Never Used  . Alcohol use No  . Drug use: No  . Sexual activity: No   Other Topics Concern  . Not on file   Social History Narrative  . No narrative on file     Review of Systems: Gen: No fevers or chills HEENT: Denies any vision problems. Decreased hearing CV: No chest pain, palpitations Resp: No shortness of breath, cough or sputum production GI: Appetite has been poor, no nausea, vomiting, diarrhea or blood in the stool GU : No problems reported with voiding, denies hematuria MS: Normally able to walk with a cane. Recently states that she has difficulty walking Derm:  Skin tear on the left shin from fall Psych: Reports stress at home Heme: No complaints Neuro: No complaints Endocrine. No complaints  Vital Signs: Blood pressure 137/76, pulse (!) 51, temperature 97.8 F (36.6 C), resp. rate 16, height 4\' 11"  (1.499 m), weight 46.9 kg (103 lb 8 oz), SpO2 100 %.   Intake/Output Summary (Last 24 hours) at 04/29/16 1154 Last data filed at 04/29/16 0851  Gross per 24 hour  Intake  175 ml  Output              400 ml  Net              -225 ml    Weight trends: Filed Weights   04/29/16 0028 04/29/16 0428  Weight: 50.3 kg (111 lb) 46.9 kg (103 lb 8 oz)    Physical Exam: General:  Elderly, laying in the bed   HEENT Anicteric, moist oral mucous membranes, decreased hearing   Neck:  Supple   Lungs: Normal breathing effort, room air, mild rhonchi at bases   Heart::  No rub or get   Abdomen: Soft, nondistended, nontender   Extremities:  No peripheral edema   Neurologic: Alert, oriented   Skin: Tear on the left shin              Lab results: Basic Metabolic Panel:  Recent Labs Lab 04/29/16 0114 04/29/16 0855  NA 116* 118*  K 3.0*  --   CL 77*  --   CO2 28  --   GLUCOSE 105*  --   BUN 12  --   CREATININE 0.50  --   CALCIUM 9.1  --     Liver Function Tests: No results for input(s): AST, ALT, ALKPHOS, BILITOT, PROT, ALBUMIN in the last 168 hours. No results for input(s): LIPASE, AMYLASE in the last 168 hours. No results for input(s): AMMONIA in the last 168 hours.  CBC:  Recent Labs Lab 04/29/16 0114  WBC 7.5  NEUTROABS 5.4  HGB 16.1*  HCT 46.9  MCV 94.3  PLT 215    Cardiac Enzymes:  Recent Labs Lab 04/29/16 0114  TROPONINI <0.03    BNP: Invalid input(s): POCBNP  CBG: No results for input(s): GLUCAP in the last 168 hours.  Microbiology: No results found for this or any previous visit (from the past 720 hour(s)).   Coagulation Studies: No results for input(s): LABPROT, INR in the last 72 hours.  Urinalysis:  Recent Labs  04/29/16 0113  COLORURINE YELLOW*  LABSPEC 1.008  PHURINE 7.0  GLUCOSEU NEGATIVE  HGBUR 1+*  BILIRUBINUR NEGATIVE  KETONESUR TRACE*  PROTEINUR NEGATIVE  NITRITE NEGATIVE  LEUKOCYTESUR 3+*        Imaging:  No results found.   Assessment & Plan: Pt is a 82 y.o. caucasian female with HTN, osteoporosis, was admitted on 04/29/2016 with weakness.   1. Acute hyponatremia Sodium level 116 at the time of admission Home medications include Lasix  and hydrochlorothiazide which might have contributed to volume depletion as patient reports her oral intake has been poor at home. Sodium level has started to improve with IV hydration. This morning, level has improved to 118 Agree with IV normal saline at 50 cc/h Goal for collection for tomorrow morning is 124-126  2. Hypokalemia Agree with discontinuing diuretics Replace as necessary  We'll follow

## 2016-04-29 NOTE — H&P (Signed)
[facility] - [place] at [facility]   PATIENT NAME: Yvette [name]    MR#:  [id number]  DATE OF BIRTH:  01-14-1924  DATE OF ADMISSION:  04/29/2016  PRIMARY CARE PHYSICIAN: Curtis Sites III, MD   REQUESTING/REFERRING PHYSICIAN:   CHIEF COMPLAINT:   Chief Complaint  Patient presents with  . Weakness  . Anxiety    HISTORY OF PRESENT ILLNESS: Yvette [name]  is a 82 y.o. female with a known history of Arthritis, hypertension, osteoporosis, cystocele presented to the emergency room for generalized weakness and anxiety. Patient lives with her husband at home was recently discharged from the hospital and he came home yesterday. Patient has been stressed out for the last 2 weeks when her husband was hospitalized. Patient felt weak and her knees given when she had a fall around 5 PM at home. No history of any head injury. She fell on her buttock when she landed. No complaints of any abdominal pain nausea vomiting diarrhea. No history of any seizure. No history of any confusion. No complaints of any hematuria. Some burning when she passes urine. Patient was evaluated in the emergency room her sodium level was low and her urine showed infection. Patient was given IV Rocephin antibiotic emergency room and hospitalist service was consulted for further care of the patient.  PAST MEDICAL HISTORY:   Past Medical History:  Diagnosis Date  . Arthritis   . Cystocele    midline  . Hypertension   . Osteoporosis   . Procidentia of uterus     PAST SURGICAL HISTORY: Past Surgical History:  Procedure Laterality Date  . NO PAST SURGERIES      SOCIAL HISTORY:  Social History  Substance Use Topics  . Smoking status: Never Smoker  . Smokeless tobacco: Never Used  . Alcohol use No    FAMILY HISTORY:  Family History  Problem Relation Age of Onset  . Breast cancer Maternal Aunt   . Breast cancer Paternal Aunt   . Ovarian cancer Neg Hx   . Diabetes Neg Hx     DRUG ALLERGIES:   Allergies  Allergen Reactions  . Amlodipine Other (See Comments)    edema  . Hydrocodone-Acetaminophen Nausea Only    REVIEW OF SYSTEMS:   CONSTITUTIONAL: No fever, has generalized weakness.  EYES: No blurred or double vision.  EARS, NOSE, AND THROAT: No tinnitus or ear pain.  RESPIRATORY: No cough, shortness of breath, wheezing or hemoptysis.  CARDIOVASCULAR: No chest pain, orthopnea, edema.  GASTROINTESTINAL: No nausea, vomiting, diarrhea or abdominal pain.  GENITOURINARY: has dysuria,no hematuria.  ENDOCRINE: No polyuria, nocturia,  HEMATOLOGY: No anemia, easy bruising or bleeding SKIN: No rash or lesion. MUSCULOSKELETAL: No joint pain or arthritis.   NEUROLOGIC: No tingling, numbness, weakness.  PSYCHIATRY: Has anxiety  MEDICATIONS AT HOME:  Prior to Admission medications   Medication Sig Start Date End Date Taking? Authorizing Provider  alendronate (FOSAMAX) 70 MG tablet take 1 tablet by mouth every week 03/08/16   Historical Provider, MD  calcium citrate-vitamin D (CITRACAL+D) 315-200 MG-UNIT tablet Take by mouth.    Historical Provider, MD  cloNIDine (CATAPRES) 0.1 MG tablet Take 0.1 mg by mouth daily.     Historical Provider, MD  estradiol (ESTRACE VAGINAL) 0.1 MG/GM vaginal cream  06/22/14   Historical Provider, MD  furosemide (LASIX) 40 MG tablet  06/04/14   Historical Provider, MD  hydrochlorothiazide (HYDRODIURIL) 25 MG tablet take 1 tablet by mouth once daily 02/13/16   Historical Provider, MD  losartan (COZAAR) 100 MG tablet Take 100 mg by mouth daily.    Historical Provider, MD  OXYQUINOLONE SULFATE VAGINAL (TRIMO-SAN) 0.025 % GEL Place vaginally.    Historical Provider, MD  potassium chloride (K-DUR) 10 MEQ tablet  06/04/14   Historical Provider, MD  timolol (TIMOPTIC) 0.5 % ophthalmic solution  07/29/14   Historical Provider, MD      PHYSICAL EXAMINATION:   VITAL SIGNS: Blood pressure 135/84, pulse (!) 58, temperature 97.5 F (36.4 C), temperature source Oral,  resp. rate 17, height 4\' 11"  (1.499 m), weight 50.3 kg (111 lb), SpO2 100 %.  GENERAL:  82 y.o.-year-old patient lying in the bed with no acute distress.  EYES: Pupils equal, round, reactive to light and accommodation. No scleral icterus. Extraocular muscles intact.  HEENT: Head atraumatic, normocephalic. Oropharynx dry and nasopharynx clear.  NECK:  Supple, no jugular venous distention. No thyroid enlargement, no tenderness.  LUNGS: Normal breath sounds bilaterally, no wheezing, rales,rhonchi or crepitation. No use of accessory muscles of respiration.  CARDIOVASCULAR: S1, S2 normal. No murmurs, rubs, or gallops.  ABDOMEN: Soft, nontender, nondistended. Bowel sounds present. No organomegaly or mass.  EXTREMITIES: No pedal edema, cyanosis, or clubbing.  NEUROLOGIC: Cranial nerves II through XII are intact. Muscle strength 5/5 in all extremities. Sensation intact. Gait not checked.  PSYCHIATRIC: The patient is alert and oriented x 3.  SKIN: No obvious rash, lesion, or ulcer.   LABORATORY PANEL:   CBC  Recent Labs Lab 04/29/16 0114  WBC 7.5  HGB 16.1*  HCT 46.9  PLT 215  MCV 94.3  MCH 35.6*  MCHC 34.3  RDW 12.8  LYMPHSABS 1.1  MONOABS 0.9  EOSABS 0.0  BASOSABS 0.0   ------------------------------------------------------------------------------------------------------------------  Chemistries   Recent Labs Lab 04/29/16 0114  NA 116*  K 3.0*  CL 77*  CO2 28  GLUCOSE 105*  BUN 12  CREATININE 0.50  CALCIUM 9.1   ------------------------------------------------------------------------------------------------------------------ estimated creatinine clearance is 32.5 mL/min (by C-G formula based on SCr of 0.8 mg/dL). ------------------------------------------------------------------------------------------------------------------ No results for input(s): TSH, T4TOTAL, T3FREE, THYROIDAB in the last 72 hours.  Invalid input(s): FREET3   Coagulation profile No results for  input(s): INR, PROTIME in the last 168 hours. ------------------------------------------------------------------------------------------------------------------- No results for input(s): DDIMER in the last 72 hours. -------------------------------------------------------------------------------------------------------------------  Cardiac Enzymes  Recent Labs Lab 04/29/16 0114  TROPONINI <0.03   ------------------------------------------------------------------------------------------------------------------ Invalid input(s): POCBNP  ---------------------------------------------------------------------------------------------------------------  Urinalysis    Component Value Date/Time   COLORURINE YELLOW (A) 04/29/2016 0113   APPEARANCEUR CLOUDY (A) 04/29/2016 0113   LABSPEC 1.008 04/29/2016 0113   PHURINE 7.0 04/29/2016 0113   GLUCOSEU NEGATIVE 04/29/2016 0113   HGBUR 1+ (A) 04/29/2016 0113   BILIRUBINUR NEGATIVE 04/29/2016 0113   KETONESUR TRACE (A) 04/29/2016 0113   PROTEINUR NEGATIVE 04/29/2016 0113   NITRITE NEGATIVE 04/29/2016 0113   LEUKOCYTESUR 3+ (A) 04/29/2016 0113     RADIOLOGY: No results found.  EKG: Orders placed or performed during the hospital encounter of 04/29/16  . ED EKG  . ED EKG  . EKG 12-Lead  . EKG 12-Lead    IMPRESSION AND PLAN: 82 year old elderly female patient with history of arthritis, osteoporosis, cystocele presented to the emergency room with generalized weakness and fall. Admitting diagnosis 1. Hyponatremia 2. Accidental fall 3. Hypokalemia 4. Urinary tract infection 5. Dehydration Treatment plan Admit patient to telemetry IV fluid hydration Replace potassium Follow-up electrolytes IV Rocephin 1 g daily and follow-up cultures Supportive care.  All the records are reviewed and case discussed with  ED provider. Management plans discussed with the patient, family and they are in agreement.  CODE STATUS:FULL Code Status  History    This patient does not have a recorded code status. Please follow your organizational policy for patients in this situation.       TOTAL TIME TAKING CARE OF THIS PATIENT: 53 minutes.    Ihor Austin M.D on 04/29/2016 at 3:14 AM  Between 7am to 6pm - Pager - [phone]  After 6pm go to [email or web address] - password [email or web address] [facility]  [facility]  Office  [phone]  CC: Primary care physician; Lynnea Ferrier, MD

## 2016-04-29 NOTE — ED Notes (Signed)
Pt was assisted to the restroom via bedpan due to gen. Weakness. Clean specimen obtained.

## 2016-04-29 NOTE — Progress Notes (Signed)
Spoke with dr. Renae Glosswieting to make aware sodium went from 118 to 117. Per md will continue fluids as ordered, no new orders. Will speak with nephro for remaining sodium levels per dr. Natasha Meadwietings request.

## 2016-04-29 NOTE — ED Notes (Signed)
Urine sample and repeat lab work was sent to the lab,. RN notified

## 2016-04-29 NOTE — ED Notes (Signed)
Family now at bedside, daughter and son in law; pt resting comfortably; skin pink, warm and dry; they report pt has been weak for about 2 weeks but seems to be worse Saturday after husband returning home from hospital; they say it was a stressful day for everyone, but seems most stressful for pt; pt understands urine specimen needed when she can void; fall risk band applied

## 2016-04-29 NOTE — Progress Notes (Signed)
Patient is admitted to room 257 with the diagnosis of hyponatremia. Alert  and oriented x 4. Denied any acute pain, no respiratory distress noted. Tele box called to CCMD by Roderick PeeLisa R. RN and Liborio NixonJanice NT. Skin assessment done with Roderick PeeLisa R. RN, noted skin tear to the left lateral calf and abrasion to the right elbow and ecchymosis to bilateral upper extremities. Patient voiced no concern, will continue to monitor.

## 2016-04-29 NOTE — Progress Notes (Signed)
[facility] PROGRESS NOTE  Yvette [name] ZOX:[id number] DOB: [birth year] DOA: 04/29/2016 PCP: Lynnea Ferrier, MD  HPI/Subjective: Husband was recently in the hospital and just cannot home on Saturday. At home she wasn't really eating too much and she was taking her medications. She had a fall on Saturday and hit her left leg on the couch. She's been feeling really weak.  Objective: Vitals:   04/29/16 0428 04/29/16 0650  BP: (!) 163/84   Pulse: (!) 116 86  Resp: 18   Temp: 97.6 F (36.4 C)     Filed Weights   04/29/16 0028 04/29/16 0428  Weight: 50.3 kg (111 lb) 46.9 kg (103 lb 8 oz)    ROS: Review of Systems  Constitutional: Negative for chills and fever.  Eyes: Negative for blurred vision.  Respiratory: Negative for cough and shortness of breath.   Cardiovascular: Negative for chest pain.  Gastrointestinal: Negative for abdominal pain, constipation, diarrhea, nausea and vomiting.  Genitourinary: Negative for dysuria.  Musculoskeletal: Negative for joint pain.  Neurological: Positive for weakness. Negative for dizziness and headaches.   Exam: Physical Exam  Constitutional: She is oriented to person, place, and time.  HENT:  Nose: No mucosal edema.  Mouth/Throat: No oropharyngeal exudate or posterior oropharyngeal edema.  Eyes: Conjunctivae, EOM and lids are normal. Pupils are equal, round, and reactive to light.  Neck: No JVD present. Carotid bruit is not present. No edema present. No thyroid mass and no thyromegaly present.  Cardiovascular: S1 normal and S2 normal.  An irregularly irregular rhythm present. Exam reveals no gallop.   No murmur heard. Pulses:      Dorsalis pedis pulses are 2+ on the right side, and 2+ on the left side.  Respiratory: No respiratory distress. She has no wheezes. She has no rhonchi. She has no rales.  GI: Soft. Bowel sounds are normal. There is no tenderness.  Musculoskeletal:       Right ankle: She exhibits swelling.       Left ankle:  She exhibits swelling.  Lymphadenopathy:    She has no cervical adenopathy.  Neurological: She is alert and oriented to person, place, and time. No cranial nerve deficit.  Skin: Skin is warm. Nails show no clubbing.  Small skin tear left leg. No signs of infection.  Psychiatric: She has a normal mood and affect.      Data Reviewed: Basic Metabolic Panel:  Recent Labs Lab 04/29/16 0114 04/29/16 0855  NA 116* 118*  K 3.0*  --   CL 77*  --   CO2 28  --   GLUCOSE 105*  --   BUN 12  --   CREATININE 0.50  --   CALCIUM 9.1  --     CBC:  Recent Labs Lab 04/29/16 0114  WBC 7.5  NEUTROABS 5.4  HGB 16.1*  HCT 46.9  MCV 94.3  PLT 215   Cardiac Enzymes:  Recent Labs Lab 04/29/16 0114  TROPONINI <0.03    Scheduled Meds: . calcium citrate-vitamin D  1 tablet Oral Daily  . cefTRIAXone (ROCEPHIN)  IV  1 g Intravenous Q24H  . cloNIDine  0.1 mg Oral Daily  . enoxaparin (LOVENOX) injection  40 mg Subcutaneous Daily  . feeding supplement (ENSURE ENLIVE)  237 mL Oral BID BM  . losartan  100 mg Oral Daily  . magnesium sulfate 1 - 4 g bolus IVPB  2 g Intravenous Once  . potassium chloride  40 mEq Oral Once  . sodium chloride flush  3 mL Intravenous Q12H  . timolol  1 drop Both Eyes Daily   Continuous Infusions: . sodium chloride 50 mL/hr at 04/29/16 0837    Assessment/Plan:  1. Severe hyponatremia. This is secondary to hydrochlorothiazide and not eating very well. Continue gentle IV fluid hydration and monitor sodiums every 4 hours. Appreciate nephrology consultation. 2. Hypokalemia replace potassium orally 3. Acute cystitis with hematuria on Rocephin IV. Follow-up urine culture 4. Atrial fibrillation. Likely secondary to dehydration. Hydrate patient and give IV magnesium. Currently rate controlled. We'll get echocardiogram and cardiology consultation. Start low-dose aspirin. 5. Weakness physical therapy evaluation  Code Status:     Code Status Orders         Start     Ordered   04/29/16 0413  Full code  Continuous     04/29/16 0412    Code Status History    Date Active Date Inactive Code Status Order ID Comments User Context   This patient has a current code status but no historical code status.     Disposition Plan: To be determined  Consultants:  Nephrology  Cardiology  Antibiotics:  Rocephin  Time spent: 35 minutes  Alford HighlandWIETING, Kiora Hallberg  [employer] [facility]

## 2016-04-29 NOTE — Consult Note (Signed)
Reason for Consult: atrial fibrillation Referring Physician:  Dr. Zeb Comfort  Primary,  Doctor Pyreddy hospitalist  Yvette [name] is an 82 y.o. female.  HPI:  82 year old female history of arthritis hypertension osteoporosis complained of generalize weakness and anxiety she has been care for her ill husband and is been under fair amount of stress she was recently discharged home yesterday. Patient id been trying to care for him basically alone she started to feel weak in the knees complained of lightheadedness vertigo feeling reportedly fell at home landed on her buttocks denies any other trauma denies any palpitations or tachycardia because of the fall she was brought to the emergency room for evaluation for Salter Urinary Tract Infection Bernat electrolytes also abnormal with very low sodium of 116 and low potassium she is currently on HCTZ for hypertension control. Because of abnormal electrolytes today atrial fibrillation Urinary Tract Infection and dehydration she is admitted for further evaluation  Past Medical History:  Diagnosis Date  . Arthritis   . Cystocele    midline  . Hypertension   . Osteoporosis   . Procidentia of uterus     Past Surgical History:  Procedure Laterality Date  . NO PAST SURGERIES      Family History  Problem Relation Age of Onset  . Breast cancer Maternal Aunt   . Breast cancer Paternal Aunt   . Ovarian cancer Neg Hx   . Diabetes Neg Hx     Social History:  reports that she has never smoked. She has never used smokeless tobacco. She reports that she does not drink alcohol or use drugs.  Allergies:  Allergies  Allergen Reactions  . Amlodipine Other (See Comments)    edema  . Hydrocodone-Acetaminophen Nausea Only    Medications: I have reviewed the patient's current medications.  Results for orders placed or performed during the hospital encounter of 04/29/16 (from the past 48 hour(s))  Urinalysis complete, with microscopic     Status: Abnormal   Collection Time: 04/29/16  1:13 AM  Result Value Ref Range   Color, Urine YELLOW (A) YELLOW   APPearance CLOUDY (A) CLEAR   Glucose, UA NEGATIVE NEGATIVE mg/dL   Bilirubin Urine NEGATIVE NEGATIVE   Ketones, ur TRACE (A) NEGATIVE mg/dL   Specific Gravity, Urine 1.008 1.005 - 1.030   Hgb urine dipstick 1+ (A) NEGATIVE   pH 7.0 5.0 - 8.0   Protein, ur NEGATIVE NEGATIVE mg/dL   Nitrite NEGATIVE NEGATIVE   Leukocytes, UA 3+ (A) NEGATIVE   RBC / HPF 0-5 0 - 5 RBC/hpf   WBC, UA 6-30 0 - 5 WBC/hpf   Bacteria, UA MANY (A) NONE SEEN   Squamous Epithelial / LPF 6-30 (A) NONE SEEN   Mucous PRESENT   Basic metabolic panel     Status: Abnormal   Collection Time: 04/29/16  1:14 AM  Result Value Ref Range   Sodium 116 (LL) 135 - 145 mmol/L    Comment: CRITICAL RESULT CALLED TO, READ BACK BY AND VERIFIED WITH LAURIE ALLEN AT 0159 ON 04/29/16 RWW    Potassium 3.0 (L) 3.5 - 5.1 mmol/L   Chloride 77 (L) 101 - 111 mmol/L   CO2 28 22 - 32 mmol/L   Glucose, Bld 105 (H) 65 - 99 mg/dL   BUN 12 6 - 20 mg/dL   Creatinine, Ser 0.50 0.44 - 1.00 mg/dL   Calcium 9.1 8.9 - 10.3 mg/dL   GFR calc non Af Amer >60 >60 mL/min   GFR calc Af  Amer >60 >60 mL/min    Comment: (NOTE) The eGFR has been calculated using the CKD EPI equation. This calculation has not been validated in all clinical situations. eGFR's persistently <60 mL/min signify possible Chronic Kidney Disease.    Anion gap 11 5 - 15  CBC with Differential/Platelet     Status: Abnormal   Collection Time: 04/29/16  1:14 AM  Result Value Ref Range   WBC 7.5 3.6 - 11.0 K/uL   RBC 4.51 3.80 - 5.20 MIL/uL   Hemoglobin 16.1 (H) 12.0 - 16.0 g/dL   HCT 46.9 35.0 - 47.0 %    Comment: HEMATOCRIT WAS OBTAINED BY MANUAL METHOD   MCV 94.3 80.0 - 100.0 fL   MCH 35.6 (H) 26.0 - 34.0 pg   MCHC 34.3 32.0 - 36.0 g/dL   RDW 12.8 11.5 - 14.5 %   Platelets 215 150 - 440 K/uL   Neutrophils Relative % 73% %   Neutro Abs 5.4 1.4 - 6.5 K/uL   Lymphocytes Relative  15% %   Lymphs Abs 1.1 1.0 - 3.6 K/uL   Monocytes Relative 12% %   Monocytes Absolute 0.9 0.2 - 0.9 K/uL   Eosinophils Relative 0% %   Eosinophils Absolute 0.0 0 - 0.7 K/uL   Basophils Relative 0% %   Basophils Absolute 0.0 0 - 0.1 K/uL  Troponin I     Status: None   Collection Time: 04/29/16  1:14 AM  Result Value Ref Range   Troponin I <0.03 <0.03 ng/mL  Sodium     Status: Abnormal   Collection Time: 04/29/16  8:55 AM  Result Value Ref Range   Sodium 118 (LL) 135 - 145 mmol/L    Comment: CRITICAL RESULT CALLED TO, READ BACK BY AND VERIFIED WITH EMILY GANNON ON 04/29/16 AT 0934 BY [facility]   Sodium     Status: Abnormal   Collection Time: 04/29/16  1:08 PM  Result Value Ref Range   Sodium 117 (LL) 135 - 145 mmol/L    Comment: CRITICAL RESULT CALLED TO, READ BACK BY AND VERIFIED WITH CRYSTAL GROGAN AT 1344 ON 04/29/16.Marland KitchenMarland KitchenVal [facility]   Sodium     Status: Abnormal   Collection Time: 04/29/16  4:41 PM  Result Value Ref Range   Sodium 116 (LL) 135 - 145 mmol/L    Comment: CRITICAL RESULT CALLED TO, READ BACK BY AND VERIFIED WITH EMILY GANNON ON 04/29/16 AT 1720 BY [facility]   Sodium     Status: Abnormal   Collection Time: 04/29/16  7:07 PM  Result Value Ref Range   Sodium 118 (LL) 135 - 145 mmol/L    Comment: CRITICAL RESULT CALLED TO, READ BACK BY AND VERIFIED WITH CHARLOTTE KYEI AT 1942 04/29/16.PMH    No results found.  Review of Systems  Constitutional: Positive for malaise/fatigue.  Eyes: Negative.   Respiratory: Negative.   Cardiovascular: Positive for palpitations.  Gastrointestinal: Negative.   Genitourinary: Negative.   Musculoskeletal: Positive for myalgias.  Skin: Negative.   Neurological: Positive for dizziness, tingling, loss of consciousness, weakness and headaches.  Endo/Heme/Allergies: Negative.   Psychiatric/Behavioral: The patient is nervous/anxious.    Blood pressure 137/76, pulse (!) 51, temperature 97.8 F (36.6 C), resp. rate 16, height _0  (1.499 m), weight 46.9 kg  (103 lb 8 oz), SpO2 100 %. Physical Exam  Nursing note and vitals reviewed. Constitutional: She appears well-developed and well-nourished.  HENT:  Head: Normocephalic and atraumatic.  Eyes: Conjunctivae and EOM are normal. Pupils are equal, round, and reactive  to light.  Neck: Normal range of motion. Neck supple.  Cardiovascular: Normal rate, normal heart sounds and normal pulses.  An irregular rhythm present.    Assessment/Plan:  atrial fibrillation  hyponatremia  hypokalemia  vertigo  weakness  anxiety  hypertension by history  fall  Urinary Tract Infection  dehydration . PLAN  agree with telemetry  follow up rhythm  continue rate control  correct electrolytes  fall precautions  antibiotic therapy for urinary tract infection  fluid hydration for dehyration  DVT prophylaxis  recommend conservative Cardiology input at this point Defer anticoagulation for now  Bayden Gil D. 04/29/2016, 8:04 PM

## 2016-04-30 ENCOUNTER — Inpatient Hospital Stay
Admit: 2016-04-30 | Discharge: 2016-04-30 | Disposition: A | Discharge status: Home / Self Care | Payer: Medicare Other | Attending: Internal Medicine | Admitting: Internal Medicine

## 2016-04-30 LAB — BASIC METABOLIC PANEL
ANION GAP: 3 — AB (ref 5–15)
BUN: 11 mg/dL (ref 6–20)
CALCIUM: 8.1 mg/dL — AB (ref 8.9–10.3)
CO2: 26 mmol/L (ref 22–32)
Chloride: 91 mmol/L — ABNORMAL LOW (ref 101–111)
Creatinine, Ser: 0.48 mg/dL (ref 0.44–1.00)
GFR calc Af Amer: >60 mL/min (ref >60)
GLUCOSE: 96 mg/dL (ref 65–99)
Potassium: 4.3 mmol/L (ref 3.5–5.1)
Sodium: 120 mmol/L — ABNORMAL LOW (ref 135–145)

## 2016-04-30 LAB — URINE CULTURE

## 2016-04-30 LAB — CBC
HCT: 35.3 % (ref 35.0–47.0)
HEMOGLOBIN: 12.8 g/dL (ref 12.0–16.0)
MCH: 34.8 pg — ABNORMAL HIGH (ref 26.0–34.0)
MCHC: 36.1 g/dL — AB (ref 32.0–36.0)
MCV: 96.2 fL (ref 80.0–100.0)
Platelets: 154 10*3/uL (ref 150–440)
RBC: 3.67 MIL/uL — ABNORMAL LOW (ref 3.80–5.20)
RDW: 13.2 % (ref 11.5–14.5)
WBC: 5 10*3/uL (ref 3.6–11.0)

## 2016-04-30 LAB — TSH: TSH: 2.404 u[IU]/mL (ref 0.350–4.500)

## 2016-04-30 LAB — SODIUM: SODIUM: 119 mmol/L — AB (ref 135–145)

## 2016-04-30 MED ORDER — METOPROLOL SUCCINATE ER 25 MG PO TB24
25.0000 mg | ORAL_TABLET | Freq: Every day | ORAL | Status: DC
  Administered 2016-04-30 – 2016-05-01 (×2): 25 mg via ORAL
  Filled 2016-04-30 (×2): qty 1

## 2016-04-30 NOTE — Evaluation (Signed)
Physical Therapy Evaluation Patient Details Name: Yvette [name] MRN: [id number] DOB: [birth year] Today's Date: 04/30/2016   History of Present Illness  presented to ER secondary to progressive weakness and fall in home environment; admitted with hyponatremia and new-onset a-fib (rate-controlled).  Na+ currently 120.  Clinical Impression  Patient reports improved strength since admission, though still generally weak and unsteady with progressive mobility.  Increased sway, decreased balance in all planes requiring min assist from therapist during all mobility tasks without assist device.  Does improve to cga with use of RW, with patient subjectively reporting improved comfort/confidence with use of assist device.   Recommend continued use of RW with all mobility at this time until strength/balance improve.  Patient voiced understanding/agreement. Would benefit from skilled PT to address above deficits and promote optimal return to PLOF; Recommend transition to HHPT upon discharge from acute hospitalization.     Follow Up Recommendations Home health PT    Equipment Recommendations  Rolling walker with 5" wheels    Recommendations for Other Services       Precautions / Restrictions Precautions Precautions: Fall Restrictions Weight Bearing Restrictions: No      Mobility  Bed Mobility Overal bed mobility: Modified Independent                Transfers Overall transfer level: Needs assistance Equipment used: Rolling walker (2 wheeled) Transfers: Sit to/from Stand Sit to Stand: Min assist            Ambulation/Gait Ambulation/Gait assistance: Min assist Ambulation Distance (Feet): 50 Feet         General Gait Details: narrowed BOS with LEs occasionally crossing midline; excessive sway, generally unsafe/unsteady  Stairs            Wheelchair Mobility    Modified Rankin (Stroke Patients Only)       Balance Overall balance assessment: Needs  assistance Sitting-balance support: No upper extremity supported;Feet supported Sitting balance-Leahy Scale: Good     Standing balance support: No upper extremity supported Standing balance-Leahy Scale: Poor                               Pertinent Vitals/Pain Pain Assessment: No/denies pain    Home Living Family/patient expects to be discharged to:: Private residence Living Arrangements: Spouse/significant other Available Help at Discharge: Family Type of Home: House Home Access: Stairs to enter Entrance Stairs-Rails: Can reach both Entrance Stairs-Number of Steps: 3 Home Layout: One level Home Equipment: Cane - single point      Prior Function Level of Independence: Independent         Comments: At baseline, indep with ADLs, household and community mobility; has been using SPC in last 1-2 weeks due to fatigue/weakness.  Denies fall history outside of this episode.  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     Hand Dominance        Extremity/Trunk Assessment   Upper Extremity Assessment: Overall WFL for tasks assessed           Lower Extremity Assessment: Overall WFL for tasks assessed (grossly 4+/5 throughout)         Communication   Communication: No difficulties  Cognition Arousal/Alertness: Awake/alert Behavior During Therapy: WFL for tasks assessed/performed Overall Cognitive Status: Within Functional Limits for tasks assessed  General Comments      Exercises Other Exercises Other Exercises: 150' with RW, cga--improved step height/length and overall fluidity; improved cadence and gait speed.  Patient subjectively reporting improved comfort/confidence with use of RW; recommend continued use of RW with all mobility at this time.      Assessment/Plan    PT Assessment Patient needs continued PT services  PT Diagnosis Difficulty walking;Generalized  weakness   PT Problem List Decreased strength;Decreased activity tolerance;Decreased balance;Decreased mobility;Decreased coordination;Decreased knowledge of precautions  PT Treatment Interventions DME instruction;Stair training;Gait training;Functional mobility training;Therapeutic activities;Therapeutic exercise;Patient/family education   PT Goals (Current goals can be found in the Care Plan section) Acute Rehab PT Goals Patient Stated Goal: to get stronger and get back home PT Goal Formulation: With patient Time For Goal Achievement: 05/14/16 Potential to Achieve Goals: Good    Frequency Min 2X/week   Barriers to discharge Decreased caregiver support      Co-evaluation               End of Session Equipment Utilized During Treatment: Gait belt Activity Tolerance: Patient tolerated treatment well Patient left: in chair;with call bell/phone within reach;with chair alarm set Nurse Communication: Mobility status         Time: [id number] PT Time Calculation (min) (ACUTE ONLY): 23 min   Charges:   PT Evaluation $PT Eval Low Complexity: 1 Procedure PT Treatments $Gait Training: 8-22 mins   PT G Codes:        Rebeckah Masih H. Manson PasseyBrown, PT, DPT, NCS 04/30/16, 1:11 PM [phone]

## 2016-04-30 NOTE — Progress Notes (Signed)
[facility] PROGRESS NOTE  Yvette [name] ZOX:[id number] DOB: 1924-07-20 DOA: 04/29/2016 PCP: Lynnea Ferrier, MD  HPI/Subjective: Patient feeling better today. States her appetite is better. Feeling a little bit stronger.  Objective: Vitals:   04/30/16 0911 04/30/16 1137  BP: (!) 143/65 (!) 146/81  Pulse: 70 (!) 57  Resp: 16 18  Temp: 98.1 F (36.7 C) 97.5 F (36.4 C)    Filed Weights   04/29/16 0028 04/29/16 0428  Weight: 50.3 kg (111 lb) 46.9 kg (103 lb 8 oz)    ROS: Review of Systems  Constitutional: Negative for chills and fever.  Eyes: Negative for blurred vision.  Respiratory: Negative for cough and shortness of breath.   Cardiovascular: Negative for chest pain.  Gastrointestinal: Negative for abdominal pain, constipation, diarrhea, nausea and vomiting.  Genitourinary: Negative for dysuria.  Musculoskeletal: Negative for joint pain.  Neurological: Positive for weakness. Negative for dizziness and headaches.   Exam: Physical Exam  Constitutional: She is oriented to person, place, and time.  HENT:  Nose: No mucosal edema.  Mouth/Throat: No oropharyngeal exudate or posterior oropharyngeal edema.  Eyes: Conjunctivae, EOM and lids are normal. Pupils are equal, round, and reactive to light.  Neck: No JVD present. Carotid bruit is not present. No edema present. No thyroid mass and no thyromegaly present.  Cardiovascular: S1 normal and S2 normal.  An irregularly irregular rhythm present. Exam reveals no gallop.   No murmur heard. Pulses:      Dorsalis pedis pulses are 2+ on the right side, and 2+ on the left side.  Respiratory: No respiratory distress. She has no wheezes. She has no rhonchi. She has no rales.  GI: Soft. Bowel sounds are normal. There is no tenderness.  Musculoskeletal:       Right ankle: She exhibits swelling.       Left ankle: She exhibits swelling.  Lymphadenopathy:    She has no cervical adenopathy.  Neurological: She is alert and oriented to  person, place, and time. No cranial nerve deficit.  Skin: Skin is warm. Nails show no clubbing.  Small skin tear left leg. No signs of infection.  Psychiatric: She has a normal mood and affect.      Data Reviewed: Basic Metabolic Panel:  Recent Labs Lab 04/29/16 0114  04/29/16 1641 04/29/16 1907 04/29/16 2047 04/30/16 0143 04/30/16 0526  NA 116*  < > 116* 118* 118* 119* 120*  K 3.0*  --   --   --   --   --  4.3  CL 77*  --   --   --   --   --  91*  CO2 28  --   --   --   --   --  26  GLUCOSE 105*  --   --   --   --   --  96  BUN 12  --   --   --   --   --  11  CREATININE 0.50  --   --   --   --   --  0.48  CALCIUM 9.1  --   --   --   --   --  8.1*  < > = values in this interval not displayed.  CBC:  Recent Labs Lab 04/29/16 0114 04/30/16 0526  WBC 7.5 5.0  NEUTROABS 5.4  --   HGB 16.1* 12.8  HCT 46.9 35.3  MCV 94.3 96.2  PLT 215 154   Cardiac Enzymes:  Recent Labs  Lab 04/29/16 0114  TROPONINI <0.03    Scheduled Meds: . aspirin EC  81 mg Oral Daily  . calcium citrate-vitamin D  1 tablet Oral Daily  . cefTRIAXone (ROCEPHIN)  IV  1 g Intravenous Q24H  . enoxaparin (LOVENOX) injection  40 mg Subcutaneous Daily  . feeding supplement (ENSURE ENLIVE)  237 mL Oral BID BM  . losartan  100 mg Oral Daily  . metoprolol succinate  25 mg Oral Daily  . sodium chloride flush  3 mL Intravenous Q12H  . timolol  1 drop Both Eyes Daily   Continuous Infusions: . sodium chloride 50 mL/hr at 04/29/16 0837    Assessment/Plan:  1. Severe hyponatremia. This is secondary to hydrochlorothiazide and not eating very well. Continue gentle IV fluid hydration. Last sodium 120. Recheck tomorrow. 2. Hypokalemia replaced 3. Acute cystitis with hematuria on Rocephin IV. Urine culture growing multiple organisms likely contamination. I will continue Rocephin for 3 day course. 4. Atrial fibrillation. Likely secondary to dehydration. Started low-dose Toprol. Aspirin for anticoagulation.  Appreciate cardiology consultation. 5. Weakness physical therapy evaluation appreciated.  Code Status:     Code Status Orders        Start     Ordered   04/29/16 0413  Full code  Continuous     04/29/16 0412    Code Status History    Date Active Date Inactive Code Status Order ID Comments User Context   This patient has a current code status but no historical code status.     Disposition Plan: Home with home health once sodium is better  Consultants:  Nephrology  Cardiology  Antibiotics:  Rocephin  Time spent: 25 minutes  Alford HighlandWIETING, Ottie Tillery  [employer] [facility]

## 2016-04-30 NOTE — Progress Notes (Signed)
Subjective:   Doing well this morning Eating better NA upto 120 Iv NS continued   Objective:  Vital signs in last 24 hours:  Temp:  [97.7 F (36.5 C)-98.1 F (36.7 C)] 98.1 F (36.7 C) (09/04 0911) Pulse Rate:  [50-110] 70 (09/04 0911) Resp:  [14-18] 16 (09/04 0911) BP: (108-143)/(55-76) 143/65 (09/04 0911) SpO2:  [97 %-100 %] 100 % (09/04 0911)  Weight change:  Filed Weights   04/29/16 0028 04/29/16 0428  Weight: 50.3 kg (111 lb) 46.9 kg (103 lb 8 oz)    Intake/Output:    Intake/Output Summary (Last 24 hours) at 04/30/16 1101 Last data filed at 04/30/16 0939  Gross per 24 hour  Intake          1696.59 ml  Output             1550 ml  Net           146.59 ml     Physical Exam: General: NAD, lsying in the bed  HEENT Anicteric, moist oral mucus mebbranes  Neck Supple, no masses  Pulm/lungs Normal effort, minimal crackles at rt base  CVS/Heart Irregular, soft systolic murmur  Abdomen:  Soft, NT  Extremities: No edema  Neurologic: A & O  Skin: No acute rashes          Basic Metabolic Panel:   Recent Labs Lab 04/29/16 0114  04/29/16 1641 04/29/16 1907 04/29/16 2047 04/30/16 0143 04/30/16 0526  NA 116*  < > 116* 118* 118* 119* 120*  K 3.0*  --   --   --   --   --  4.3  CL 77*  --   --   --   --   --  91*  CO2 28  --   --   --   --   --  26  GLUCOSE 105*  --   --   --   --   --  96  BUN 12  --   --   --   --   --  11  CREATININE 0.50  --   --   --   --   --  0.48  CALCIUM 9.1  --   --   --   --   --  8.1*  < > = values in this interval not displayed.   CBC:  Recent Labs Lab 04/29/16 0114 04/30/16 0526  WBC 7.5 5.0  NEUTROABS 5.4  --   HGB 16.1* 12.8  HCT 46.9 35.3  MCV 94.3 96.2  PLT 215 154      Microbiology:  No results found for this or any previous visit (from the past 720 hour(s)).  Coagulation Studies: No results for input(s): LABPROT, INR in the last 72 hours.  Urinalysis:  Recent Labs  04/29/16 0113  COLORURINE  YELLOW*  LABSPEC 1.008  PHURINE 7.0  GLUCOSEU NEGATIVE  HGBUR 1+*  BILIRUBINUR NEGATIVE  KETONESUR TRACE*  PROTEINUR NEGATIVE  NITRITE NEGATIVE  LEUKOCYTESUR 3+*      Imaging: No results found.   Medications:   . sodium chloride 50 mL/hr at 04/29/16 0837   . aspirin EC  81 mg Oral Daily  . calcium citrate-vitamin D  1 tablet Oral Daily  . cefTRIAXone (ROCEPHIN)  IV  1 g Intravenous Q24H  . enoxaparin (LOVENOX) injection  40 mg Subcutaneous Daily  . feeding supplement (ENSURE ENLIVE)  237 mL Oral BID BM  . losartan  100 mg Oral Daily  . metoprolol  succinate  25 mg Oral Daily  . sodium chloride flush  3 mL Intravenous Q12H  . timolol  1 drop Both Eyes Daily   acetaminophen **OR** acetaminophen, ondansetron **OR** ondansetron (ZOFRAN) IV, senna-docusate  Assessment/ Plan:  82 y.o.caucasian female with HTN, osteoporosis, was admitted on 04/29/2016 with weakness.   1. Acute hyponatremia Sodium level 116 at the time of admission Home medications include Lasix and hydrochlorothiazide which might have contributed to volume depletion as patient reported her oral intake was poor at home.  Sodium levels have improved with IV hydration. This morning, level has improved to 120 Agree with IV normal saline at 50 cc/h Goal for collection for tomorrow morning is 128-130  2. Hypokalemia Agree with discontinuing diuretics Replace as necessary   LOS: 1 Tu Bayle 9/4/201711:01 AM

## 2016-04-30 NOTE — Care Management (Signed)
Patient presents from home with weakness.  She was found to have significant hyponatremia (116) most likely due to use of Lasix and Hydrochlorothiazide.  [facility] and had referral for home health services through [facility],  Physical therapy has recommended home health therapy for patient and a rolling walker.  Patient is agreeable to home health but "will think about the walker."  Requested order from attending.

## 2016-05-01 LAB — BASIC METABOLIC PANEL
Anion gap: 8 (ref 5–15)
BUN: 12 mg/dL (ref 6–20)
CALCIUM: 8 mg/dL — AB (ref 8.9–10.3)
CO2: 22 mmol/L (ref 22–32)
CREATININE: 0.48 mg/dL (ref 0.44–1.00)
Chloride: 95 mmol/L — ABNORMAL LOW (ref 101–111)
GFR calc Af Amer: >60 mL/min (ref >60)
GFR calc non Af Amer: >60 mL/min (ref >60)
GLUCOSE: 123 mg/dL — AB (ref 65–99)
Potassium: 4.3 mmol/L (ref 3.5–5.1)
Sodium: 125 mmol/L — ABNORMAL LOW (ref 135–145)

## 2016-05-01 LAB — ECHOCARDIOGRAM COMPLETE
Height: 59 in
Weight: 1656 oz

## 2016-05-01 MED ORDER — CLONIDINE HCL 0.1 MG PO TABS
0.1000 mg | ORAL_TABLET | Freq: Every day | ORAL | Status: DC
  Administered 2016-05-01 – 2016-05-03 (×3): 0.1 mg via ORAL
  Filled 2016-05-01 (×3): qty 1

## 2016-05-01 MED ORDER — APIXABAN 2.5 MG PO TABS
2.5000 mg | ORAL_TABLET | Freq: Two times a day (BID) | ORAL | Status: DC
  Administered 2016-05-01 – 2016-05-03 (×5): 2.5 mg via ORAL
  Filled 2016-05-01 (×5): qty 1

## 2016-05-01 NOTE — Progress Notes (Signed)
Subjective:  Serum sodium continues to improve appropriately. Serum sodium currently 125. Patient resting comfortably in bed this a.m. Good urine output noted.    Objective:  Vital signs in last 24 hours:  Temp:  [97.5 F (36.4 C)-98.1 F (36.7 C)] 97.5 F (36.4 C) (09/05 0736) Pulse Rate:  [57-122] 80 (09/05 0900) Resp:  [18-20] 18 (09/05 0736) BP: (146-181)/(81-101) 165/94 (09/05 0900) SpO2:  [96 %-100 %] 100 % (09/05 0900)  Weight change:  Filed Weights   04/29/16 0028 04/29/16 0428  Weight: 50.3 kg (111 lb) 46.9 kg (103 lb 8 oz)    Intake/Output:    Intake/Output Summary (Last 24 hours) at 05/01/16 1039 Last data filed at 05/01/16 1015  Gross per 24 hour  Intake              640 ml  Output             1450 ml  Net             -810 ml     Physical Exam: General: NAD, laying in the bed  HEENT Anicteric, moist oral mucus mebbranes  Neck Supple  Pulm/lungs Normal effort, CTAB  CVS/Heart Irregular, soft systolic murmur  Abdomen:  Soft, NTND, BS present  Extremities: No edema  Neurologic: Awake, oriented to self, partially to place  Skin: No acute rashes          Basic Metabolic Panel:   Recent Labs Lab 04/29/16 0114  04/29/16 1907 04/29/16 2047 04/30/16 0143 04/30/16 0526 05/01/16 0848  NA 116*  < > 118* 118* 119* 120* 125*  K 3.0*  --   --   --   --  4.3 4.3  CL 77*  --   --   --   --  91* 95*  CO2 28  --   --   --   --  26 22  GLUCOSE 105*  --   --   --   --  96 123*  BUN 12  --   --   --   --  11 12  CREATININE 0.50  --   --   --   --  0.48 0.48  CALCIUM 9.1  --   --   --   --  8.1* 8.0*  < > = values in this interval not displayed.   CBC:  Recent Labs Lab 04/29/16 0114 04/30/16 0526  WBC 7.5 5.0  NEUTROABS 5.4  --   HGB 16.1* 12.8  HCT 46.9 35.3  MCV 94.3 96.2  PLT 215 154      Microbiology:  Recent Results (from the past 720 hour(s))  Urine culture     Status: Abnormal   Collection Time: 04/29/16  1:13 AM  Result  Value Ref Range Status   Specimen Description URINE, RANDOM  Final   Special Requests NONE  Final   Culture MULTIPLE SPECIES PRESENT, SUGGEST RECOLLECTION (A)  Final   Report Status 04/30/2016 FINAL  Final    Coagulation Studies: No results for input(s): LABPROT, INR in the last 72 hours.  Urinalysis:  Recent Labs  04/29/16 0113  COLORURINE YELLOW*  LABSPEC 1.008  PHURINE 7.0  GLUCOSEU NEGATIVE  HGBUR 1+*  BILIRUBINUR NEGATIVE  KETONESUR TRACE*  PROTEINUR NEGATIVE  NITRITE NEGATIVE  LEUKOCYTESUR 3+*      Imaging: No results found.   Medications:   . sodium chloride 50 mL/hr at 05/01/16 0902   . aspirin EC  81 mg Oral Daily  .  calcium citrate-vitamin D  1 tablet Oral Daily  . cefTRIAXone (ROCEPHIN)  IV  1 g Intravenous Q24H  . enoxaparin (LOVENOX) injection  40 mg Subcutaneous Daily  . feeding supplement (ENSURE ENLIVE)  237 mL Oral BID BM  . losartan  100 mg Oral Daily  . metoprolol succinate  25 mg Oral Daily  . sodium chloride flush  3 mL Intravenous Q12H  . timolol  1 drop Both Eyes Daily   acetaminophen **OR** acetaminophen, ondansetron **OR** ondansetron (ZOFRAN) IV, senna-docusate  Assessment/ Plan:  82 y.o.caucasian female with HTN, osteoporosis, was admitted on 04/29/2016 with weakness.   1. Acute hyponatremia Sodium level 116 at the time of admission Home medications include Lasix and hydrochlorothiazide which might have contributed to volume depletion as patient reported her oral intake was poor at home. -  Serum sodium has come up nicely to 125. Continue 0.9 normal saline however we will increase the rate to 65 cc per hour. Continue to monitor serum sodium.  2. Hypokalemia Serum potassium stable at 4.3. Diuretics were likely contributing to hypokalemia. Continue to monitor.  3.  Hypertension. Continue metoprolol as well as losartan.  LOS: 2 Chrisma Hurlock 9/5/201710:39 AM

## 2016-05-01 NOTE — Progress Notes (Signed)
Patient ID: Yvette [name], female   DOB: 24-Jun-1924, 82 y.o.   MRN: [id number]  [facility] PROGRESS NOTE  Yvette [name] NFA:[id number] DOB: [birth year] DOA: 04/29/2016 PCP: Lynnea Ferrier, MD  HPI/Subjective: Patient feeling better today. Feeling stronger. Eating better. Concerned that blood pressure is up a little bit.  Objective: Vitals:   05/01/16 0900 05/01/16 1120  BP: (!) 165/94 (!) 162/80  Pulse: 80 70  Resp:  18  Temp:  97.9 F (36.6 C)    Filed Weights   04/29/16 0028 04/29/16 0428  Weight: 50.3 kg (111 lb) 46.9 kg (103 lb 8 oz)    ROS: Review of Systems  Constitutional: Negative for chills and fever.  Eyes: Negative for blurred vision.  Respiratory: Negative for cough and shortness of breath.   Cardiovascular: Negative for chest pain.  Gastrointestinal: Negative for abdominal pain, constipation, diarrhea, nausea and vomiting.  Genitourinary: Negative for dysuria.  Musculoskeletal: Negative for joint pain.  Neurological: Positive for weakness. Negative for dizziness and headaches.   Exam: Physical Exam  Constitutional: She is oriented to person, place, and time.  HENT:  Nose: No mucosal edema.  Mouth/Throat: No oropharyngeal exudate or posterior oropharyngeal edema.  Eyes: Conjunctivae, EOM and lids are normal. Pupils are equal, round, and reactive to light.  Neck: No JVD present. Carotid bruit is not present. No edema present. No thyroid mass and no thyromegaly present.  Cardiovascular: S1 normal and S2 normal.  An irregularly irregular rhythm present. Exam reveals no gallop.   No murmur heard. Pulses:      Dorsalis pedis pulses are 2+ on the right side, and 2+ on the left side.  Respiratory: No respiratory distress. She has no wheezes. She has no rhonchi. She has no rales.  GI: Soft. Bowel sounds are normal. There is no tenderness.  Musculoskeletal:       Right ankle: She exhibits swelling.       Left ankle: She exhibits swelling.  Lymphadenopathy:   She has no cervical adenopathy.  Neurological: She is alert and oriented to person, place, and time. No cranial nerve deficit.  Skin: Skin is warm. Nails show no clubbing.  Small skin tear left leg. No signs of infection.  Psychiatric: She has a normal mood and affect.      Data Reviewed: Basic Metabolic Panel:  Recent Labs Lab 04/29/16 0114  04/29/16 1907 04/29/16 2047 04/30/16 0143 04/30/16 0526 05/01/16 0848  NA 116*  < > 118* 118* 119* 120* 125*  K 3.0*  --   --   --   --  4.3 4.3  CL 77*  --   --   --   --  91* 95*  CO2 28  --   --   --   --  26 22  GLUCOSE 105*  --   --   --   --  96 123*  BUN 12  --   --   --   --  11 12  CREATININE 0.50  --   --   --   --  0.48 0.48  CALCIUM 9.1  --   --   --   --  8.1* 8.0*  < > = values in this interval not displayed.  CBC:  Recent Labs Lab 04/29/16 0114 04/30/16 0526  WBC 7.5 5.0  NEUTROABS 5.4  --   HGB 16.1* 12.8  HCT 46.9 35.3  MCV 94.3 96.2  PLT 215 154   Cardiac Enzymes:  Recent Labs Lab  04/29/16 0114  TROPONINI <0.03    Scheduled Meds: . apixaban  2.5 mg Oral BID  . calcium citrate-vitamin D  1 tablet Oral Daily  . cloNIDine  0.1 mg Oral Daily  . feeding supplement (ENSURE ENLIVE)  237 mL Oral BID BM  . losartan  100 mg Oral Daily  . sodium chloride flush  3 mL Intravenous Q12H  . timolol  1 drop Both Eyes Daily   Continuous Infusions:    Assessment/Plan:  1. Severe hyponatremia. This is secondary to hydrochlorothiazide and not eating very well. Sodium up to 125 today. Check again tomorrow. Can stop IV fluids today. 2. Hypokalemia replaced 3. Acute cystitis with hematuria on Rocephin IV. Urine culture growing multiple organisms likely contamination. I will continue Rocephin for 3 day course then stop. 4. Atrial fibrillation. Stop Toprol with pauses bradycardia on telemetry. Start Eliquis for anticoagulation. Stop aspirin. Since echocardiogram shows dilated atrium likely the patient will stay in  atrial fibrillation 5. Weakness physical therapy evaluation appreciated. 6. Essential hypertension restart clonidine  Code Status:     Code Status Orders        Start     Ordered   04/29/16 0413  Full code  Continuous     04/29/16 0412    Code Status History    Date Active Date Inactive Code Status Order ID Comments User Context   This patient has a current code status but no historical code status.     Disposition Plan: Home with home health once sodium is better. Potentially tomorrow  Consultants:  Nephrology  Cardiology  Antibiotics:  Rocephin 3 day course  Time spent: 24 minutes  Alford HighlandWIETING, Ardean Melroy  [employer]

## 2016-05-01 NOTE — Progress Notes (Signed)
Patient alert and oriented, denies any pain, vss, rounded with MD at  Bedside, patient  updated about plan of care, continue to be Afib on the monitor, [facility] .

## 2016-05-01 NOTE — Consult Note (Signed)
ANTICOAGULATION CONSULT NOTE - Initial Consult  Pharmacy Consult for apixaban Indication: atrial fibrillation  Allergies  Allergen Reactions  . Amlodipine Other (See Comments)    edema  . Hydrocodone-Acetaminophen Nausea Only    Patient Measurements: Height: 4\' 11"  (149.9 cm) Weight: 103 lb 8 oz (46.9 kg) IBW/kg (Calculated) : 43.2 Heparin Dosing Weight:   Vital Signs: Temp: 97.5 F (36.4 C) (09/05 0736) Temp Source: Oral (09/05 0736) BP: 165/94 (09/05 0900) Pulse Rate: 80 (09/05 0900)  Labs:  Recent Labs  04/29/16 0114 04/30/16 0526 05/01/16 0848  HGB 16.1* 12.8  --   HCT 46.9 35.3  --   PLT 215 154  --   CREATININE 0.50 0.48 0.48  TROPONINI <0.03  --   --     Estimated Creatinine Clearance: 32.5 mL/min (by C-G formula based on SCr of 0.8 mg/dL).   Medical History: Past Medical History:  Diagnosis Date  . Arthritis   . Cystocele    midline  . Hypertension   . Osteoporosis   . Procidentia of uterus     Medications:  Scheduled:  . apixaban  2.5 mg Oral BID  . calcium citrate-vitamin D  1 tablet Oral Daily  . cefTRIAXone (ROCEPHIN)  IV  1 g Intravenous Q24H  . cloNIDine  0.1 mg Oral Daily  . feeding supplement (ENSURE ENLIVE)  237 mL Oral BID BM  . losartan  100 mg Oral Daily  . sodium chloride flush  3 mL Intravenous Q12H  . timolol  1 drop Both Eyes Daily    Assessment: Pt is a 82 year old female found to have afib and hyponatremia. Pt has a CHA2DS2VASc scroe of 4 (age, sex, HTN). Pharmacy consulted to dose apixaban.  Goal of Therapy:   Monitor platelets by anticoagulation protocol: Yes   Plan:  apixaban 2.5mg  BID (>80, weight <60kg)  Melissa D Maccia, Pharm.D Clinical Pharmacist  05/01/2016,11:02 AM

## 2016-05-01 NOTE — Care Management (Signed)
Up walking in hall with walker with staff. It is anticipated that patient will be ready for discharge tomorrow.

## 2016-05-02 LAB — BASIC METABOLIC PANEL
ANION GAP: 7 (ref 5–15)
BUN: 13 mg/dL (ref 6–20)
CALCIUM: 8.2 mg/dL — AB (ref 8.9–10.3)
CO2: 23 mmol/L (ref 22–32)
Chloride: 94 mmol/L — ABNORMAL LOW (ref 101–111)
Creatinine, Ser: 0.45 mg/dL (ref 0.44–1.00)
Glucose, Bld: 93 mg/dL (ref 65–99)
POTASSIUM: 4.2 mmol/L (ref 3.5–5.1)
Sodium: 124 mmol/L — ABNORMAL LOW (ref 135–145)

## 2016-05-02 LAB — SODIUM
SODIUM: 122 mmol/L — AB (ref 135–145)
Sodium: 131 mmol/L — ABNORMAL LOW (ref 135–145)

## 2016-05-02 MED ORDER — TOLVAPTAN 15 MG PO TABS
15.0000 mg | ORAL_TABLET | Freq: Once | ORAL | Status: AC
  Administered 2016-05-02: 15 mg via ORAL
  Filled 2016-05-02: qty 1

## 2016-05-02 MED ORDER — TUBERCULIN PPD 5 UNIT/0.1ML ID SOLN
5.0000 [IU] | Freq: Once | INTRADERMAL | Status: DC
  Administered 2016-05-02: 5 [IU] via INTRADERMAL
  Filled 2016-05-02: qty 0.1

## 2016-05-02 NOTE — Progress Notes (Signed)
Subjective:  Serum sodium was 124 this a.m. Case discussed with hospitalist. Patient to be given 1 dose of tolvaptan today.   Objective:  Vital signs in last 24 hours:  Temp:  [97.4 F (36.3 C)-97.7 F (36.5 C)] 97.7 F (36.5 C) (09/06 0330) Pulse Rate:  [49-68] 68 (09/06 1122) Resp:  [16-18] 18 (09/06 1122) BP: (127-158)/(58-84) 127/59 (09/06 1122) SpO2:  [97 %-99 %] 99 % (09/06 1122)  Weight change:  Filed Weights   04/29/16 0028 04/29/16 0428  Weight: 50.3 kg (111 lb) 46.9 kg (103 lb 8 oz)    Intake/Output:    Intake/Output Summary (Last 24 hours) at 05/02/16 1220 Last data filed at 05/02/16 1131  Gross per 24 hour  Intake              480 ml  Output             1350 ml  Net             -870 ml     Physical Exam: General: NAD, laying in the bed  HEENT Anicteric, moist oral mucus mebbranes  Neck Supple  Pulm/lungs Normal effort, CTAB  CVS/Heart Irregular, soft systolic murmur  Abdomen:  Soft, NTND, BS present  Extremities: No edema  Neurologic: Awake, oriented to self, partially to place  Skin: No acute rashes          Basic Metabolic Panel:   Recent Labs Lab 04/29/16 0114  04/30/16 0143 04/30/16 0526 05/01/16 0848 05/02/16 0513 05/02/16 0940  NA 116*  < > 119* 120* 125* 124* 122*  K 3.0*  --   --  4.3 4.3 4.2  --   CL 77*  --   --  91* 95* 94*  --   CO2 28  --   --  26 22 23   --   GLUCOSE 105*  --   --  96 123* 93  --   BUN 12  --   --  11 12 13   --   CREATININE 0.50  --   --  0.48 0.48 0.45  --   CALCIUM 9.1  --   --  8.1* 8.0* 8.2*  --   < > = values in this interval not displayed.   CBC:  Recent Labs Lab 04/29/16 0114 04/30/16 0526  WBC 7.5 5.0  NEUTROABS 5.4  --   HGB 16.1* 12.8  HCT 46.9 35.3  MCV 94.3 96.2  PLT 215 154      Microbiology:  Recent Results (from the past 720 hour(s))  Urine culture     Status: Abnormal   Collection Time: 04/29/16  1:13 AM  Result Value Ref Range Status   Specimen Description URINE,  RANDOM  Final   Special Requests NONE  Final   Culture MULTIPLE SPECIES PRESENT, SUGGEST RECOLLECTION (A)  Final   Report Status 04/30/2016 FINAL  Final    Coagulation Studies: No results for input(s): LABPROT, INR in the last 72 hours.  Urinalysis: No results for input(s): COLORURINE, LABSPEC, PHURINE, GLUCOSEU, HGBUR, BILIRUBINUR, KETONESUR, PROTEINUR, UROBILINOGEN, NITRITE, LEUKOCYTESUR in the last 72 hours.  Invalid input(s): APPERANCEUR    Imaging: No results found.   Medications:     . apixaban  2.5 mg Oral BID  . calcium citrate-vitamin D  1 tablet Oral Daily  . cloNIDine  0.1 mg Oral Daily  . feeding supplement (ENSURE ENLIVE)  237 mL Oral BID BM  . losartan  100 mg Oral Daily  . sodium  chloride flush  3 mL Intravenous Q12H  . timolol  1 drop Both Eyes Daily   acetaminophen **OR** acetaminophen, ondansetron **OR** ondansetron (ZOFRAN) IV, senna-docusate  Assessment/ Plan:  82 y.o.caucasian female with HTN, osteoporosis, was admitted on 04/29/2016 with weakness.   1. Acute hyponatremia Sodium level 116 at the time of admission Home medications include Lasix and hydrochlorothiazide which might have contributed to volume depletion as patient reported her oral intake was poor at home. -  Serum sodium currently 124. We will administer a dose of tolvaptan 15 mg 1. The patient's baseline serum sodium appears to be between 129-131.  2. Hypokalemia Serum potassium stable at 4.2. Continue to periodically monitor.  3.  Hypertension. Blood pressure currently 127/59. Continue clonidine and losartan.   LOS: 3 Kharee Lesesne 9/6/201712:20 PM

## 2016-05-02 NOTE — Care Management (Signed)
CSW will assess for skilled nursing placement due to her debility.  Her sodium today is 124 down one point from 9/5.  Will receive tolvaptan and reassess level 9.7

## 2016-05-02 NOTE — Progress Notes (Signed)
CSW, Trula OreChristina, requesting PPD/TB skin test for patient. Ok to order per Dr. Renae GlossWieting.

## 2016-05-02 NOTE — Care Management (Signed)
Patient ambulated 300 feet today and was able to navigate steps.  This is much improved over previous evaluation.  She will not qualify for skilled nursing.  Family wishes to pursue placement in assisted living- [facility].  A room has been secured for patient's husband who is not ready for discharge, so will use it for patient.  Patient is in agreement.  Agency preference for home health is [facility]- will need nursing and physical therapy.  Notified [facility] of need for youth rolling walker

## 2016-05-02 NOTE — Care Management Important Message (Signed)
Important Message  Patient Details  Name: Yvette PollackReva [name] MRN: [id number] Date of Birth: [birth year]   Medicare Important Message Given:  Yes    Eber HongGreene, Demiah Gullickson R, RN 05/02/2016, 9:46 AM

## 2016-05-02 NOTE — Clinical Social Work Note (Signed)
Clinical Social Work Assessment  Patient Details  Name: Yvette [name] MRN: [id number] Date of Birth: 01-15-1924  Date of referral:  05/02/16               Reason for consult:  Discharge Planning                Permission sought to share information with:  Family Supports Permission granted to share information::     Name::        Agency::     Relationship::   [name]- Daughter )  Contact Information:     Housing/Transportation Living arrangements for the past 2 months:  Single Family Home Source of Information:  Adult Children, Patient Patient Interpreter Needed:  None Criminal Activity/Legal Involvement Pertinent to Current Situation/Hospitalization:    Significant Relationships:  Adult Children, Spouse Lives with:  Spouse Do you feel safe going back to the place where you live?  No Need for family participation in patient care:  Yes (Comment) [name]- Daughter)  Care giving concerns:  Patient's family feels that patient will need an ALF placement at discharge.    Social Worker assessment / plan:  CSW met with patient. Patient was sitting up in chair. Introduced herself and her role. Patient granted CSW to speak to her daughter [name] about discharge plans. CSW contacted [name] over the phone. Introduced herself and her role. Informed [name] that patient walked too well for STR. Informed her that patient would be be appropriate for ALF placement. Per [name] she has arranged for patient to go to [facility] at discharge. CSW requested a TB test to be placed. Granted verbal permission to coordinate discharge with [facility]. Family will be privately playing for ALF placement.   CSW contacted Engineer, maintenance at [facility]. She reported that patient can come tomorrow 05/03/16 if she's ready for discharge. Stated that she can read TB test once patient is discharged to their facility. Requested an FL2. CSW will complete FL2 once discharge information has been completed. CSW will continue  to follow and assist.   Employment status:  Retired Forensic scientist:  Medicare PT Recommendations:  Home with [place] / Referral to community resources:   (ALF)  Patient/Family's Response to care:  Patient and her family are in agreement for patient to discharge to [facility] at discharge.   Patient/Family's Understanding of and Emotional Response to Diagnosis, Current Treatment, and Prognosis:  Patient and her family report they understand patient's Diagnosis, Current Treatment, and Prognosis. Thanked CSW for assistance.   Emotional Assessment Appearance:  Appears stated age Attitude/Demeanor/Rapport:   (None) Affect (typically observed):  Calm, Pleasant, Accepting Orientation:  Oriented to Self, Oriented to Place, Oriented to  Time, Oriented to Situation Alcohol / Substance use:  Not Applicable Psych involvement (Current and /or in the community):  No (Comment)  Discharge Needs  Concerns to be addressed:  Discharge Planning Concerns Readmission within the last 30 days:  No Current discharge risk:  Chronically ill Barriers to Discharge:  Continued Medical Work up   [employer], LCSW 05/02/2016, 1:46 PM

## 2016-05-02 NOTE — NC FL2 (Signed)
[place] MEDICAID FL2 LEVEL OF CARE SCREENING TOOL     IDENTIFICATION  Patient Name: Yvette [name] Birthdate: 06/14/1924 Sex: female Admission Date (Current Location): 04/29/2016  [facility] and [place] Number:  Chiropodist and Address:  [facility], [address] [place], [place] [address]      Provider Number: [id number]  Attending Physician Name and Address:  Alford Highland, MD  Relative Name and Phone Number:       Current Level of Care: Hospital Recommended Level of Care: Assisted Living Facility Prior Approval Number:    Date Approved/Denied:   PASRR Number:    Discharge Plan: Domiciliary (Rest home) ([facility])    Current Diagnoses: Patient Active Problem List   Diagnosis Date Noted  . Hyponatremia 04/29/2016  . UTI (lower urinary tract infection) 04/29/2016  . 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 Cystocele 03/25/2015  . Absolute anemia 12/14/2014  . Arthritis 12/14/2014  . HLD (hyperlipidemia) 12/14/2014  . BP (high blood pressure) 12/14/2014  . Back ache 04/20/2014    Orientation RESPIRATION BLADDER Height & Weight     Self, Time, Situation, Place  Normal Continent Weight: 103 lb 8 oz (46.9 kg) Height:  4\' 11"  (149.9 cm)  BEHAVIORAL SYMPTOMS/MOOD NEUROLOGICAL BOWEL NUTRITION STATUS   (None)  (None) Continent Diet (Heart )  AMBULATORY STATUS COMMUNICATION OF NEEDS Skin   Limited Assist Verbally Normal                       Personal Care Assistance Level of Assistance  Bathing, Feeding, Dressing Bathing Assistance: Limited assistance Feeding assistance: Independent Dressing Assistance: Limited assistance     Functional Limitations Info  Sight, Hearing, Speech Sight Info: Adequate Hearing Info: Impaired Speech Info: Adequate    SPECIAL CARE FACTORS FREQUENCY  PT (By licensed PT)     PT Frequency:  (2-3 X per week)              Contractures       Additional Factors Info  Code Status, Allergies Code Status Info:  (Full Code) Allergies Info:  (Amlodipine, Hydrocodone-acetaminophen)           Current Medications (05/02/2016):  This is the current hospital active medication list Current Facility-Administered Medications  Medication Dose Route Frequency Provider Last Rate Last Dose  . acetaminophen (TYLENOL) tablet 650 mg  650 mg Oral Q6H PRN Ihor Austin, MD       Or  . acetaminophen (TYLENOL) suppository 650 mg  650 mg Rectal Q6H PRN Ihor Austin, MD      . apixaban (ELIQUIS) tablet 2.5 mg  2.5 mg Oral BID Olene Floss, RPH   2.5 mg at 05/02/16 0914  . calcium citrate-vitamin D 500-400 MG-UNIT per chewable tablet 1 tablet  1 tablet Oral Daily Ihor Austin, MD   1 tablet at 05/02/16 0914  . cloNIDine (CATAPRES) tablet 0.1 mg  0.1 mg Oral Daily Alford Highland, MD   0.1 mg at 05/02/16 0914  . feeding supplement (ENSURE ENLIVE) (ENSURE ENLIVE) liquid 237 mL  237 mL Oral BID BM Alford Highland, MD   237 mL at 05/02/16 1400  . losartan (COZAAR) tablet 100 mg  100 mg Oral Daily Ihor Austin, MD   100 mg at 05/02/16 0914  . ondansetron (ZOFRAN) tablet 4 mg  4 mg Oral Q6H PRN Ihor Austin, MD       Or  . ondansetron (ZOFRAN) injection 4 mg  4  mg Intravenous Q6H PRN Ihor AustinPavan Pyreddy, MD      . senna-docusate (Senokot-S) tablet 1 tablet  1 tablet Oral QHS PRN Pavan Pyreddy, MD      . sodium chloride flush (NS) 0.9 % injection 3 mL  3 mL Intravenous Q12H Ihor AustinPavan Pyreddy, MD   3 mL at 05/02/16 0916  . timolol (TIMOPTIC) 0.5 % ophthalmic solution 1 drop  1 drop Both Eyes Daily Ihor AustinPavan Pyreddy, MD   1 drop at 05/01/16 0922  . tuberculin injection 5 Units  5 Units Intradermal Once Alford Highlandichard Wieting, MD         Discharge Medications: Please see discharge summary for a list of discharge medications.  Relevant Imaging Results:  Relevant Lab Results:   Additional Information  (SSN [id number])  Verta Ellenhristina E Kodi Guerrera, LCSW

## 2016-05-02 NOTE — Progress Notes (Signed)
Physical Therapy Treatment Patient Details Name: Yvette [name] MRN: [id number] DOB: [birth year] Today's Date: 05/02/2016    History of Present Illness presented to ER secondary to progressive weakness and fall in home environment; admitted with hyponatremia and new-onset a-fib (rate-controlled).  Na+ currently 120.    PT Comments    Pt agreeable to PT. Pt demonstrates improved ambulation distance and quality with rolling walker. Pt would benefit from a youth rolling walker due to her height below 5 foot. Pt demonstrates ability to negotiate up/down 6 steps with Min guard and use of 1 or 2 rails. Heart rate and O2 saturation within safe levels post treatment. Pt does express a lack of confidence with the use of rolling walker, but eager to improve with practice. Pt wishes back to bed post session, as she is awaiting to use the bathroom post medication. Continue PT to progress strength and balance/confidence with rolling walker use and stairs for improved functional mobility and safe discharge home.   Follow Up Recommendations  Home health PT     Equipment Recommendations  Rolling walker with 5" wheels (youth)   Recommendations for Other Services       Precautions / Restrictions Restrictions Weight Bearing Restrictions: No    Mobility  Bed Mobility Overal bed mobility: Modified Independent             General bed mobility comments: Use of rails  Transfers Overall transfer level: Needs assistance Equipment used: Rolling walker (2 wheeled) Transfers: Sit to/from Stand Sit to Stand: Supervision         General transfer comment: cues for safe hand placement. Rw lowered for improved fit,, but continues a bit too high. Performed 2x  Ambulation/Gait Ambulation/Gait assistance: Supervision Ambulation Distance (Feet): 300 Feet Assistive device: Rolling walker (2 wheeled) Gait Pattern/deviations: Step-through pattern;WFL(Within Functional Limits)   Gait velocity interpretation:  at or above normal speed for age/gender General Gait Details: Steady with rw; occasional drift R/L , but no LOB.    Stairs Stairs: Yes Stairs assistance: Min guard Stair Management: One rail Left;Two rails;Step to pattern;Alternating pattern;Forwards Number of Stairs: 6 General stair comments: Pefoms reciprocal ascending with 2 rails and step to down with B hands on 1 L rail. Mildly unconfident, but no LOB  Wheelchair Mobility    Modified Rankin (Stroke Patients Only)       Balance Overall balance assessment: Modified Independent                                  Cognition Arousal/Alertness: Awake/alert Behavior During Therapy: WFL for tasks assessed/performed Overall Cognitive Status: Within Functional Limits for tasks assessed                      Exercises      General Comments        Pertinent Vitals/Pain Pain Assessment: No/denies pain    Home Living                      Prior Function            PT Goals (current goals can now be found in the care plan section) Progress towards PT goals: Progressing toward goals    Frequency  Min 2X/week    PT Plan Current plan remains appropriate    Co-evaluation             End of Session Equipment Utilized During  Treatment: Gait belt Activity Tolerance: Patient tolerated treatment well Patient left: in bed;with call bell/phone within reach;with bed alarm set;with family/visitor present     Time: 1036-1100 PT Time Calculation (min) (ACUTE ONLY): 24 min  Charges:  $Gait Training: 23-37 mins                    G Codes:      Yvette [name], PTA 05/02/2016, 11:12 AM

## 2016-05-02 NOTE — Progress Notes (Signed)
Patient ID: Yvette [name], female   DOB: 24-Aug-1924, 82 y.o.   MRN: [id number]  [facility] PROGRESS NOTE  Yvette [name] OZH:[id number] DOB: [birth year] DOA: 04/29/2016 PCP: Lynnea Ferrier, MD  HPI/Subjective: Patient feels well. Feeling stronger. Eating better. Family concerned about her going home.  Objective: Vitals:   05/02/16 1036 05/02/16 1122  BP:  (!) 127/59  Pulse: 64 68  Resp:  18  Temp:      Filed Weights   04/29/16 0028 04/29/16 0428  Weight: 50.3 kg (111 lb) 46.9 kg (103 lb 8 oz)    ROS: Review of Systems  Constitutional: Negative for chills and fever.  Eyes: Negative for blurred vision.  Respiratory: Negative for cough and shortness of breath.   Cardiovascular: Negative for chest pain.  Gastrointestinal: Negative for abdominal pain, constipation, diarrhea, nausea and vomiting.  Genitourinary: Negative for dysuria.  Musculoskeletal: Negative for joint pain.  Neurological: Positive for weakness. Negative for dizziness and headaches.   Exam: Physical Exam  Constitutional: She is oriented to person, place, and time.  HENT:  Nose: No mucosal edema.  Mouth/Throat: No oropharyngeal exudate or posterior oropharyngeal edema.  Eyes: Conjunctivae, EOM and lids are normal. Pupils are equal, round, and reactive to light.  Neck: No JVD present. Carotid bruit is not present. No edema present. No thyroid mass and no thyromegaly present.  Cardiovascular: S1 normal and S2 normal.  An irregularly irregular rhythm present. Exam reveals no gallop.   No murmur heard. Pulses:      Dorsalis pedis pulses are 2+ on the right side, and 2+ on the left side.  Respiratory: No respiratory distress. She has no wheezes. She has no rhonchi. She has no rales.  GI: Soft. Bowel sounds are normal. There is no tenderness.  Musculoskeletal:       Right ankle: She exhibits swelling.       Left ankle: She exhibits swelling.  Lymphadenopathy:    She has no cervical adenopathy.  Neurological:  She is alert and oriented to person, place, and time. No cranial nerve deficit.  Skin: Skin is warm. Nails show no clubbing.  Small skin tear left leg. No signs of infection.  Psychiatric: She has a normal mood and affect.      Data Reviewed: Basic Metabolic Panel:  Recent Labs Lab 04/29/16 0114  04/30/16 0143 04/30/16 0526 05/01/16 0848 05/02/16 0513 05/02/16 0940  NA 116*  < > 119* 120* 125* 124* 122*  K 3.0*  --   --  4.3 4.3 4.2  --   CL 77*  --   --  91* 95* 94*  --   CO2 28  --   --  26 22 23   --   GLUCOSE 105*  --   --  96 123* 93  --   BUN 12  --   --  11 12 13   --   CREATININE 0.50  --   --  0.48 0.48 0.45  --   CALCIUM 9.1  --   --  8.1* 8.0* 8.2*  --   < > = values in this interval not displayed.  CBC:  Recent Labs Lab 04/29/16 0114 04/30/16 0526  WBC 7.5 5.0  NEUTROABS 5.4  --   HGB 16.1* 12.8  HCT 46.9 35.3  MCV 94.3 96.2  PLT 215 154   Cardiac Enzymes:  Recent Labs Lab 04/29/16 0114  TROPONINI <0.03    Scheduled Meds: . apixaban  2.5 mg Oral BID  . calcium  citrate-vitamin D  1 tablet Oral Daily  . cloNIDine  0.1 mg Oral Daily  . feeding supplement (ENSURE ENLIVE)  237 mL Oral BID BM  . losartan  100 mg Oral Daily  . sodium chloride flush  3 mL Intravenous Q12H  . timolol  1 drop Both Eyes Daily    Assessment/Plan:  1. Severe hyponatremia. This is secondary to hydrochlorothiazide and not eating very well. This morning sodium was 124 and a repeat sodium was 122. A dose of 12 appendectomy and ordered by nephrology. Serial hemoglobins and monitor closely. 2. Hypokalemia replaced 3. Acute cystitis with hematuria. Patient finished 3 days of Rocephin IV. Urine culture growing multiple organisms likely contamination.  4. Atrial fibrillation. Continue Eliquis for anticoagulation. Since echocardiogram shows dilated atrium likely the patient will stay in atrial fibrillation. 5. Weakness physical therapy evaluation appreciated. Family interested in  rehabilitation 6. Essential hypertension clonidine  Code Status:     Code Status Orders        Start     Ordered   04/29/16 0413  Full code  Continuous     04/29/16 0412    Code Status History    Date Active Date Inactive Code Status Order ID Comments User Context   This patient has a current code status but no historical code status.     Disposition Plan: Family interested in rehabilitation. Spoke with Child psychotherapistsocial worker about this.  Consultants:  Nephrology  Cardiology  Antibiotics:  Finished Rocephin 3 day course  Time spent: 27 minutes  Alford HighlandWIETING, Zayvier Caravello  [employer]

## 2016-05-02 NOTE — Plan of Care (Signed)
Problem: Physical Regulation: Goal: Will remain free from infection Outcome: Not Progressing Provide patient with education about proper hygiene while toileting.

## 2016-05-03 LAB — CBC
HCT: 34.3 % — ABNORMAL LOW (ref 35.0–47.0)
Hemoglobin: 12.5 g/dL (ref 12.0–16.0)
MCH: 36.1 pg — AB (ref 26.0–34.0)
MCHC: 36.5 g/dL — AB (ref 32.0–36.0)
MCV: 99 fL (ref 80.0–100.0)
PLATELETS: 170 10*3/uL (ref 150–440)
RBC: 3.47 MIL/uL — ABNORMAL LOW (ref 3.80–5.20)
RDW: 13.6 % (ref 11.5–14.5)
WBC: 4.8 10*3/uL (ref 3.6–11.0)

## 2016-05-03 LAB — BASIC METABOLIC PANEL
Anion gap: 6 (ref 5–15)
BUN: 19 mg/dL (ref 6–20)
CALCIUM: 8.7 mg/dL — AB (ref 8.9–10.3)
CHLORIDE: 102 mmol/L (ref 101–111)
CO2: 25 mmol/L (ref 22–32)
CREATININE: 0.72 mg/dL (ref 0.44–1.00)
GFR calc non Af Amer: >60 mL/min (ref >60)
GLUCOSE: 106 mg/dL — AB (ref 65–99)
Potassium: 4.9 mmol/L (ref 3.5–5.1)
Sodium: 133 mmol/L — ABNORMAL LOW (ref 135–145)

## 2016-05-03 MED ORDER — ENSURE ENLIVE PO LIQD: 237.0000 mL | Freq: Two times a day (BID) | ORAL | 12 refills | Status: DC

## 2016-05-03 MED ORDER — APIXABAN 2.5 MG PO TABS: 2.5000 mg | ORAL_TABLET | Freq: Two times a day (BID) | ORAL | 2 refills | Status: DC

## 2016-05-03 NOTE — Progress Notes (Signed)
Clinical Social Worker was informed that patient will be medically ready to discharge to [facility]. Patient and family are in a agreement with plan. CSW called Kendal HymenBonnie- Admissions Coordinator at [facility] to confirm that patient's bed is ready. All discharge information faxed to [facility] via HUB. Patient will discharge to [facility] via daughter.   Yvette [name], MSW, LCSW, LCAS-A Clinical Social Worker [phone]

## 2016-05-03 NOTE — Clinical Social Work Placement (Signed)
   CLINICAL SOCIAL WORK PLACEMENT  NOTE  Date:  05/03/2016  Patient Details  Name: Shaune PollackReva Callaham MRN: [id number] Date of Birth: 02-16-1924  Clinical Social Work is seeking post-discharge placement for this patient at the Assisted Living Facility level of care (*CSW will initial, date and re-position this form in  chart as items are completed):  Yes   Patient/family provided with [facility] Clinical Social Work Department's list of facilities offering this level of care within the geographic area requested by the patient (or if unable, by the patient's family).  Yes   Patient/family informed of their freedom to choose among providers that offer the needed level of care, that participate in Medicare, Medicaid or managed care program needed by the patient, have an available bed and are willing to accept the patient.  Yes   Patient/family informed of [facility]'s ownership interest in [facility] and [facility], as well as of the fact that they are under no obligation to receive care at these facilities.  PASRR submitted to EDS on       PASRR number received on       Existing PASRR number confirmed on       FL2 transmitted to all facilities in geographic area requested by pt/family on 05/03/16     FL2 transmitted to all facilities within larger geographic area on       Patient informed that his/her managed care company has contracts with or will negotiate with certain facilities, including the following:        Yes   Patient/family informed of bed offers received.  Patient chooses bed at  [facility])     Physician recommends and patient chooses bed at      Patient to be transferred to  [facility]) on 05/03/16.  Patient to be transferred to facility by  (Daughter)     Patient family notified on 05/03/16 of transfer.  Name of family member notified:   (Daughter)     PHYSICIAN       Additional Comment:     _______________________________________________ Idamae Lusherhristina E Lashaya Kienitz, LCSW 05/03/2016, 10:49 AM

## 2016-05-03 NOTE — Care Management Note (Addendum)
Case Management Note  Patient Details  Name: Yvette [name] MRN: [id number] Date of Birth: 1924-02-07  Subjective/Objective:  Discharge today with home health.                   Action/Plan: [facility] notified of discharge and need for home health. Spoke with Tamela OddiBetsy who will be delivering walker this morning.   Expected Discharge Date:   05/03/2017               Expected Discharge Plan:  Home w Home Health Services  In-House Referral:     Discharge planning Services  CM Consult  Post Acute Care Choice:  Durable Medical Equipment, Home Health Choice offered to:  Patient  DME Arranged:  Dan HumphreysWalker youth DME Agency:  [facility]  HH Arranged:  RN, PT [facility]:  [facility]  Status of Service:  Completed, signed off  If discussed at Long Length of Stay Meetings, dates discussed:    Additional Comments:  Marily MemosLisa M Shifra Swartzentruber, RN 05/03/2016, 9:34 AM

## 2016-05-03 NOTE — Care Management (Signed)
Eliquis coupon given 

## 2016-05-03 NOTE — Discharge Summary (Signed)
[facility] - [place] at [facility]   PATIENT NAME: Yvette [name]    MR#:  [id number]  DATE OF BIRTH:  [birth year]  DATE OF ADMISSION:  04/29/2016   ADMITTING PHYSICIAN: Ihor Austin, MD  DATE OF DISCHARGE: 05/03/2016.  PRIMARY CARE PHYSICIAN: Curtis Sites III, MD   ADMISSION DIAGNOSIS:   Hyponatremia [E87.1] Weakness [R53.1] UTI (lower urinary tract infection) [N39.0]  DISCHARGE DIAGNOSIS:   Principal Problem:   Hyponatremia Active Problems:   UTI (lower urinary tract infection)   SECONDARY DIAGNOSIS:   Past Medical History:  Diagnosis Date  . Arthritis   . Cystocele    midline  . Hypertension   . Osteoporosis   . Procidentia of uterus     HOSPITAL COURSE:   82 year old female with past medical history significant for arthritis, osteoporosis, hypertension presents to hospital secondary to weakness and noted to have a sodium of 116 and also urinary tract infection.  #1 severe hyponatremia-secondary to dehydration and also taking Diuretics at home. Patient was on hydrochlorothiazide and Lasix. -Received 1 dose of tolvaptan yesterday. Sodium level this morning is at 133. -Patient feels much better. She will be discharged today.  #2 acute cystitis-urine culture growing multiple organisms. Finished 3 days of IV Rocephin in the hospital.  #3 atrial fibrillation-patient is bradycardic. So not on any beta blockers or calcium channel blockers. Echocardiogram with dilated atrium. Started on eliquis for anticoagulation. -Follow up with cardiology as outpatient  #4 hypertension-well controlled on clonidine.  Worked with physical therapy. Home health recommended. Patient will be discharged to assisted living today  DISCHARGE CONDITIONS:   Stable CONSULTS OBTAINED:   Treatment Team:  Mosetta Pigeon, MD Alwyn Pea, MD  DRUG ALLERGIES:   Allergies  Allergen Reactions  . Amlodipine Other (See Comments)    edema  .  Hydrocodone-Acetaminophen Nausea Only   DISCHARGE MEDICATIONS:     Medication List    STOP taking these medications   furosemide 40 MG tablet Commonly known as:  LASIX   hydrochlorothiazide 25 MG tablet Commonly known as:  HYDRODIURIL   potassium chloride 10 MEQ tablet Commonly known as:  K-DUR     TAKE these medications   alendronate 70 MG tablet Commonly known as:  FOSAMAX take 1 tablet by mouth every week   apixaban 2.5 MG Tabs tablet Commonly known as:  ELIQUIS Take 1 tablet (2.5 mg total) by mouth 2 (two) times daily.   calcium citrate-vitamin D 315-200 MG-UNIT tablet Commonly known as:  CITRACAL+D Take 1 tablet by mouth daily.   cloNIDine 0.1 MG tablet Commonly known as:  CATAPRES Take 0.1 mg by mouth daily.   estradiol 0.1 MG/GM vaginal cream Commonly known as:  ESTRACE Place 1 Applicatorful vaginally once a week.   feeding supplement (ENSURE ENLIVE) Liqd Take 237 mLs by mouth 2 (two) times daily between meals.   losartan 100 MG tablet Commonly known as:  COZAAR Take 100 mg by mouth daily.   timolol 0.5 % ophthalmic solution Commonly known as:  TIMOPTIC Place 1 drop into both eyes daily.        DISCHARGE INSTRUCTIONS:   1. PCP follow-up in 1-2 weeks 2. Cardiology follow-up in 2 weeks  DIET:   Cardiac diet  ACTIVITY:   Activity as tolerated  OXYGEN:   Home Oxygen: No.  Oxygen Delivery: room air  DISCHARGE LOCATION:   Assisted living facility   If you experience worsening of your admission symptoms, develop shortness of breath, life threatening emergency,  suicidal or homicidal thoughts you must seek medical attention immediately by calling 911 or calling your MD immediately  if symptoms less severe.  You Must read complete instructions/literature along with all the possible adverse reactions/side effects for all the Medicines you take and that have been prescribed to you. Take any new Medicines after you have completely understood and  accpet all the possible adverse reactions/side effects.   Please note  You were cared for by a hospitalist during your hospital stay. If you have any questions about your discharge medications or the care you received while you were in the hospital after you are discharged, you can call the unit and asked to speak with the hospitalist on call if the hospitalist that took care of you is not available. Once you are discharged, your primary care physician will handle any further medical issues. Please note that NO REFILLS for any discharge medications will be authorized once you are discharged, as it is imperative that you return to your primary care physician (or establish a relationship with a primary care physician if you do not have one) for your aftercare needs so that they can reassess your need for medications and monitor your lab values.    On the day of Discharge:  VITAL SIGNS:   Blood pressure 139/62, pulse (!) 57, temperature 98 F (36.7 C), temperature source Oral, resp. rate 18, height 4\' 11"  (1.499 m), weight 46.9 kg (103 lb 8 oz), SpO2 93 %.  PHYSICAL EXAMINATION:    GENERAL:  82 y.o.-year-old Elderly patient lying in the bed with no acute distress.  EYES: Pupils equal, round, reactive to light and accommodation. No scleral icterus. Extraocular muscles intact.  HEENT: Head atraumatic, normocephalic. Oropharynx and nasopharynx clear.  NECK:  Supple, no jugular venous distention. No thyroid enlargement, no tenderness.  LUNGS: Normal breath sounds bilaterally, no wheezing, rales,rhonchi or crepitation. No use of accessory muscles of respiration. Decreased bibasilar breath sounds. CARDIOVASCULAR: S1, S2 normal. No rubs, or gallops. 2/6 systolic murmur is present. ABDOMEN: Soft, non-tender, non-distended. Bowel sounds present. No organomegaly or mass.  EXTREMITIES: No pedal edema, cyanosis, or clubbing.  NEUROLOGIC: Cranial nerves II through XII are intact. Muscle strength 5/5 in all  extremities. Sensation intact. Gait not checked.  PSYCHIATRIC: The patient is alert and oriented x 3.  SKIN: No obvious rash, lesion, or ulcer.   DATA REVIEW:   CBC  Recent Labs Lab 05/03/16 0104  WBC 4.8  HGB 12.5  HCT 34.3*  PLT 170    Chemistries   Recent Labs Lab 05/03/16 0104  NA 133*  K 4.9  CL 102  CO2 25  GLUCOSE 106*  BUN 19  CREATININE 0.72  CALCIUM 8.7*     Microbiology Results  Results for orders placed or performed during the hospital encounter of 04/29/16  Urine culture     Status: Abnormal   Collection Time: 04/29/16  1:13 AM  Result Value Ref Range Status   Specimen Description URINE, RANDOM  Final   Special Requests NONE  Final   Culture MULTIPLE SPECIES PRESENT, SUGGEST RECOLLECTION (A)  Final   Report Status 04/30/2016 FINAL  Final    RADIOLOGY:  No results found.   Management plans discussed with the patient, family and they are in agreement.  CODE STATUS:     Code Status Orders        Start     Ordered   05/03/16 0736  Full code  Continuous     05/03/16 0735  Code Status History    Date Active Date Inactive Code Status Order ID Comments User Context   04/29/2016  4:12 AM 04/30/2016  6:06 AM Full Code [id number]  Ihor AustinPavan Pyreddy, MD Inpatient      TOTAL TIME TAKING CARE OF THIS PATIENT: 37 minutes.    Enid BaasKALISETTI,Tylon Kemmerling M.D on 05/03/2016 at 9:12 AM  Between 7am to 6pm - Pager - [phone]  After 6pm go to [email or web address] - Social research officer, governmentpassword [email or web address]  [facility]  Office  [phone]  CC: Primary care physician; Lynnea FerrierBERT J KLEIN III, MD   Note: This dictation was prepared with Dragon dictation along with smaller phrase technology. Any transcriptional errors that result from this process are unintentional.

## 2016-05-03 NOTE — Progress Notes (Signed)
Discharge instructions given to patient and daughter. Verbalized understanding. Patient instructed that prescriptions were sent to pharmacy electronically. Walker delivered to patient. IV and telemetry removed. No distress at this time. Family is at bedside and will be transporting patient home.

## 2016-05-07 NOTE — Progress Notes (Signed)
[facility]  Patient Status: Closed[facility]      Dimple CaseyJason E Hinton 05/07/2016, 5:55 PM

## 2016-06-20 ENCOUNTER — Encounter: Payer: Self-pay | Admitting: Obstetrics and Gynecology

## 2016-06-20 ENCOUNTER — Ambulatory Visit (INDEPENDENT_AMBULATORY_CARE_PROVIDER_SITE_OTHER): Payer: Medicare Other | Admitting: Obstetrics and Gynecology

## 2016-06-20 VITALS — Ht 59.0 in | Wt 114.3 lb

## 2016-06-20 DIAGNOSIS — Z4689 Encounter for fitting and adjustment of other specified devices: Secondary | ICD-10-CM | POA: Diagnosis not present

## 2016-06-20 DIAGNOSIS — N813 Complete uterovaginal prolapse: Secondary | ICD-10-CM | POA: Diagnosis not present

## 2016-06-20 DIAGNOSIS — F4321 Adjustment disorder with depressed mood: Secondary | ICD-10-CM | POA: Insufficient documentation

## 2016-06-20 DIAGNOSIS — F4320 Adjustment disorder, unspecified: Secondary | ICD-10-CM | POA: Diagnosis not present

## 2016-06-20 NOTE — Progress Notes (Signed)
Chief complaint: 1. Pessary maintenance 2. Grief reaction  Patient presents for 3 month interval pessary maintenance. On 05/31/2016 she lost her spouse due to lymphoma complications-married 67 years. She desires to leave pessary out at this time due to finances  OBJECTIVE: Ht 4\' 11"  (1.499 m)   Wt 114 lb 4.8 oz (51.8 kg)   BMI 23.09 kg/m  Pleasant tearful female in no acute distress Abdomen: Soft, nontender. Pelvic exam: External genitalia-normal BUS-normal. Vagina-mild atrophy; traction cystocele present. Cervix-no lesions; 7 mm posterior cervix polyp Uterus-Not examined Adnexa-Not examined External rectal exam-Normal  Gel horn pessary is removed  ASSESSMENT: 1. Procidentia and traction cystocele 2. Discontinue pessary use  PLAN: 1. Discontinue Trimosan gel and Estrace cream therapy 2. Return as needed if pessary is to be reinserted  A total of 15 minutes were spent face-to-face with the patient during this encounter and over half of that time dealt with counseling and coordination of care.  Herold HarmsMartin A Ondria Oswald, MD  Note: This dictation was prepared with Dragon dictation along with smaller phrase technology. Any transcriptional errors that result from this process are unintentional.

## 2016-06-20 NOTE — Patient Instructions (Signed)
1. Return as needed if pessary is to be reinserted

## 2019-11-03 ENCOUNTER — Inpatient Hospital Stay
Admission: EM | Admit: 2019-11-03 | Discharge: 2019-11-13 | DRG: 640 | Disposition: A | Discharge status: Skilled Nursing Facility | Payer: Medicare Other | Attending: Internal Medicine | Admitting: Internal Medicine

## 2019-11-03 ENCOUNTER — Other Ambulatory Visit: Payer: Self-pay

## 2019-11-03 DIAGNOSIS — E861 Hypovolemia: Secondary | ICD-10-CM | POA: Diagnosis present

## 2019-11-03 DIAGNOSIS — R112 Nausea with vomiting, unspecified: Secondary | ICD-10-CM

## 2019-11-03 DIAGNOSIS — G9341 Metabolic encephalopathy: Secondary | ICD-10-CM | POA: Diagnosis not present

## 2019-11-03 DIAGNOSIS — R262 Difficulty in walking, not elsewhere classified: Secondary | ICD-10-CM | POA: Diagnosis present

## 2019-11-03 DIAGNOSIS — M810 Age-related osteoporosis without current pathological fracture: Secondary | ICD-10-CM | POA: Diagnosis present

## 2019-11-03 DIAGNOSIS — R630 Anorexia: Secondary | ICD-10-CM | POA: Diagnosis present

## 2019-11-03 DIAGNOSIS — E860 Dehydration: Secondary | ICD-10-CM | POA: Diagnosis present

## 2019-11-03 DIAGNOSIS — E8770 Fluid overload, unspecified: Secondary | ICD-10-CM | POA: Diagnosis not present

## 2019-11-03 DIAGNOSIS — R42 Dizziness and giddiness: Secondary | ICD-10-CM | POA: Diagnosis not present

## 2019-11-03 DIAGNOSIS — Z20822 Contact with and (suspected) exposure to covid-19: Secondary | ICD-10-CM | POA: Diagnosis present

## 2019-11-03 DIAGNOSIS — Z6822 Body mass index (BMI) 22.0-22.9, adult: Secondary | ICD-10-CM | POA: Diagnosis not present

## 2019-11-03 DIAGNOSIS — T501X5A Adverse effect of loop [high-ceiling] diuretics, initial encounter: Secondary | ICD-10-CM | POA: Diagnosis not present

## 2019-11-03 DIAGNOSIS — Z7901 Long term (current) use of anticoagulants: Secondary | ICD-10-CM

## 2019-11-03 DIAGNOSIS — M1990 Unspecified osteoarthritis, unspecified site: Secondary | ICD-10-CM | POA: Diagnosis present

## 2019-11-03 DIAGNOSIS — K5900 Constipation, unspecified: Secondary | ICD-10-CM | POA: Diagnosis present

## 2019-11-03 DIAGNOSIS — I482 Chronic atrial fibrillation, unspecified: Secondary | ICD-10-CM | POA: Diagnosis present

## 2019-11-03 DIAGNOSIS — Z885 Allergy status to narcotic agent status: Secondary | ICD-10-CM

## 2019-11-03 DIAGNOSIS — Z79899 Other long term (current) drug therapy: Secondary | ICD-10-CM | POA: Diagnosis not present

## 2019-11-03 DIAGNOSIS — E785 Hyperlipidemia, unspecified: Secondary | ICD-10-CM | POA: Diagnosis present

## 2019-11-03 DIAGNOSIS — Z888 Allergy status to other drugs, medicaments and biological substances status: Secondary | ICD-10-CM

## 2019-11-03 DIAGNOSIS — I10 Essential (primary) hypertension: Secondary | ICD-10-CM | POA: Diagnosis present

## 2019-11-03 DIAGNOSIS — R0602 Shortness of breath: Secondary | ICD-10-CM

## 2019-11-03 DIAGNOSIS — E876 Hypokalemia: Secondary | ICD-10-CM | POA: Diagnosis not present

## 2019-11-03 DIAGNOSIS — E871 Hypo-osmolality and hyponatremia: Secondary | ICD-10-CM | POA: Diagnosis present

## 2019-11-03 LAB — CBC
HCT: 38.1 % (ref 36.0–46.0)
Hemoglobin: 14.3 g/dL (ref 12.0–15.0)
MCH: 34.5 pg — ABNORMAL HIGH (ref 26.0–34.0)
MCHC: 37.5 g/dL — ABNORMAL HIGH (ref 30.0–36.0)
MCV: 91.8 fL (ref 80.0–100.0)
Platelets: 218 10*3/uL (ref 150–400)
RBC: 4.15 MIL/uL (ref 3.87–5.11)
RDW: 11.9 % (ref 11.5–15.5)
WBC: 5.2 10*3/uL (ref 4.0–10.5)
nRBC: 0 % (ref 0.0–0.2)

## 2019-11-03 LAB — URINALYSIS, COMPLETE (UACMP) WITH MICROSCOPIC
Bilirubin Urine: NEGATIVE
Glucose, UA: NEGATIVE mg/dL
Hgb urine dipstick: NEGATIVE
Ketones, ur: 5 mg/dL — AB
Leukocytes,Ua: NEGATIVE
Nitrite: NEGATIVE
Protein, ur: NEGATIVE mg/dL
Specific Gravity, Urine: 1.008 (ref 1.005–1.030)
pH: 7 (ref 5.0–8.0)

## 2019-11-03 LAB — BASIC METABOLIC PANEL
Anion gap: 13 (ref 5–15)
BUN: 15 mg/dL (ref 8–23)
CO2: 24 mmol/L (ref 22–32)
Calcium: 8.8 mg/dL — ABNORMAL LOW (ref 8.9–10.3)
Chloride: 76 mmol/L — ABNORMAL LOW (ref 98–111)
Creatinine, Ser: 0.61 mg/dL (ref 0.44–1.00)
GFR calc Af Amer: >60 mL/min (ref >60)
GFR calc non Af Amer: >60 mL/min (ref >60)
Glucose, Bld: 111 mg/dL — ABNORMAL HIGH (ref 70–99)
Potassium: 4.2 mmol/L (ref 3.5–5.1)
Sodium: 113 mmol/L — CL (ref 135–145)

## 2019-11-03 LAB — MRSA PCR SCREENING: MRSA by PCR: NEGATIVE

## 2019-11-03 LAB — SODIUM: Sodium: 113 mmol/L — CL (ref 135–145)

## 2019-11-03 MED ORDER — SODIUM CHLORIDE 0.9 % IV SOLN: INTRAVENOUS | Status: DC

## 2019-11-03 MED ORDER — SODIUM CHLORIDE 0.9 % IV BOLUS
1000.0000 mL | Freq: Once | INTRAVENOUS | Status: AC
  Administered 2019-11-03: 16:00:00 1000 mL via INTRAVENOUS

## 2019-11-03 MED ORDER — LABETALOL HCL 5 MG/ML IV SOLN
10.0000 mg | Freq: Once | INTRAVENOUS | Status: AC
  Administered 2019-11-03: 10 mg via INTRAVENOUS
  Filled 2019-11-03: qty 4

## 2019-11-03 MED ORDER — CHLORHEXIDINE GLUCONATE CLOTH 2 % EX PADS
6.0000 | MEDICATED_PAD | Freq: Every day | CUTANEOUS | Status: DC
  Administered 2019-11-03: 6 via TOPICAL

## 2019-11-03 MED ORDER — SODIUM CHLORIDE 0.9% FLUSH: 3.0000 mL | Freq: Once | INTRAVENOUS | Status: DC

## 2019-11-03 MED ORDER — HYDRALAZINE HCL 50 MG PO TABS
25.0000 mg | ORAL_TABLET | Freq: Three times a day (TID) | ORAL | Status: DC
  Administered 2019-11-03 – 2019-11-06 (×9): 25 mg via ORAL
  Filled 2019-11-03 (×9): qty 1

## 2019-11-03 MED ORDER — SODIUM CHLORIDE 0.9 % IV SOLN: Freq: Once | INTRAVENOUS | Status: AC

## 2019-11-03 MED ORDER — APIXABAN 2.5 MG PO TABS
2.5000 mg | ORAL_TABLET | Freq: Two times a day (BID) | ORAL | Status: DC
  Administered 2019-11-03 – 2019-11-13 (×20): 2.5 mg via ORAL
  Filled 2019-11-03 (×21): qty 1

## 2019-11-03 NOTE — ED Notes (Signed)
Pt daughter updated per pt request.

## 2019-11-03 NOTE — ED Notes (Signed)
This RN attempted to call report at this time. Informed that charge RN Dillion had not approved bed and to call back.

## 2019-11-03 NOTE — ED Notes (Signed)
Pt face noted to be red in color. This RN asked pt if this was normal for pt. Pt states "It does this when I am nervous". Pt reassured that she will be taken well care of. Pt respirations even and unlabored at this time. VSS. Pt family updated per pt request.

## 2019-11-03 NOTE — ED Triage Notes (Addendum)
Pt comes into the ED via EMS from home with c/o dizziness since Thursday, 244/112, HR 90, 96%on RA, a-fib on the monitor with hx of the same. Denies any chest pain, #20g LAC.Marland Kitchenpt states the dizziness is worse with movement, pt denies any pain. Pt is a/ox4 on arrival. Pt is HTN in triage, states it stays high and takes medications as prescribed.

## 2019-11-03 NOTE — ED Provider Notes (Signed)
[facility] Emergency Department Provider Note   ____________________________________________   First MD Initiated Contact with Patient 11/03/19 1458     (approximate)  I have reviewed the triage vital signs and the nursing notes.   HISTORY  Chief Complaint Dizziness    HPI Yvette [name] is a 86 y.o. female history of hypertension and atrial fibrillation on Eliquis presents to the ED complaining of dizziness.  She has been feeling increasingly dizzy unstable on her feet for about the past week.  Already did a CT her PCP had recently seen her for poorly controlled hypertension and started her on chlorthalidone, which seem to be as her dizziness has worsened.  She denies any vision changes, speech changes, numbness, or weakness.  She has not had any falls and hit her head.  She also denies any fevers, cough, chest pain, shortness of breath, dysuria, or hematuria.  She reports stopping the chlorthalidone a couple of days ago, but her dizziness has persisted.        Past Medical History:  Diagnosis Date  . Arthritis   . Cystocele    midline  . Hypertension   . Low sodium levels   . Osteoporosis   . Procidentia of uterus     Patient Active Problem List   Diagnosis Date Noted  . Grief 06/20/2016  . Hyponatremia 04/29/2016  . UTI (lower urinary tract infection) 04/29/2016  . 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 Cystocele 03/25/2015  . Absolute anemia 12/14/2014  . Arthritis 12/14/2014  . HLD (hyperlipidemia) 12/14/2014  . BP (high blood pressure) 12/14/2014  . Back ache 04/20/2014    Past Surgical History:  Procedure Laterality Date  . NO PAST SURGERIES      Prior to Admission medications   Medication Sig Start Date End Date Taking? Authorizing Provider  alendronate (FOSAMAX) 70 MG tablet take 1 tablet by mouth every week 03/08/16   [provider]  apixaban (ELIQUIS) 2.5 MG TABS tablet Take  1 tablet (2.5 mg total) by mouth 2 (two) times daily. 05/03/16   Gladstone Lighter, MD  calcium citrate-vitamin D (CITRACAL+D) 315-200 MG-UNIT tablet Take 1 tablet by mouth daily.     [provider]  cloNIDine (CATAPRES) 0.1 MG tablet Take 0.1 mg by mouth daily.     [provider]  estradiol (ESTRACE) 0.1 MG/GM vaginal cream Place 1 Applicatorful vaginally once a week.    [provider]  feeding supplement, ENSURE ENLIVE, (ENSURE ENLIVE) LIQD Take 237 mLs by mouth 2 (two) times daily between meals. 05/03/16   Gladstone Lighter, MD  losartan (COZAAR) 100 MG tablet Take 100 mg by mouth daily.    [provider]  metoprolol tartrate (LOPRESSOR) 25 MG tablet Take by mouth. 06/13/16 06/13/17  [provider]  timolol (TIMOPTIC) 0.5 % ophthalmic solution Place 1 drop into both eyes daily.  07/29/14   [provider]    Allergies Amlodipine and Hydrocodone-acetaminophen  Family History  Problem Relation Age of Onset  . Breast cancer Maternal Aunt   . Breast cancer Paternal Aunt   . Ovarian cancer Neg Hx   . Diabetes Neg Hx     Social History Social History   Tobacco Use  . Smoking status: Never Smoker  . Smokeless tobacco: Never Used  Substance Use Topics  . Alcohol use: No  . Drug use: No    Review of Systems  Constitutional: No fever/chills Eyes: No visual changes. ENT: No sore  throat. Cardiovascular: Denies chest pain. Respiratory: Denies shortness of breath. Gastrointestinal: No abdominal pain.  No nausea, no vomiting.  No diarrhea.  No constipation. Genitourinary: Negative for dysuria. Musculoskeletal: Negative for back pain. Skin: Negative for rash. Neurological: Negative for headaches, focal weakness or numbness.  Positive for dizziness.  ____________________________________________   PHYSICAL EXAM:  VITAL SIGNS: ED Triage Vitals  Enc Vitals Group     BP 11/03/19 1354 (!) 182/127     Pulse Rate 11/03/19 1354  79     Resp 11/03/19 1354 16     Temp 11/03/19 1354 98 F (36.7 C)     Temp Source 11/03/19 1354 Oral     SpO2 11/03/19 1354 100 %     Weight 11/03/19 1351 110 lb (49.9 kg)     Height 11/03/19 1351 4\' 11"  (1.499 m)     Head Circumference --      Peak Flow --      Pain Score 11/03/19 1351 0     Pain Loc --      Pain Edu? --      Excl. in GC? --     Constitutional: Alert and oriented to person, place, time, and situation. Eyes: Conjunctivae are normal.  Pupils equal round and reactive to light bilaterally, extraocular movements intact. Head: Atraumatic. Nose: No congestion/rhinnorhea. Mouth/Throat: Mucous membranes are moist. Neck: Normal ROM Cardiovascular: Normal rate, irregularly irregular rhythm. Grossly normal heart sounds. Respiratory: Normal respiratory effort.  No retractions. Lungs CTAB. Gastrointestinal: Soft and nontender. No distention. Genitourinary: deferred Musculoskeletal: No lower extremity tenderness nor edema. Neurologic:  Normal speech and language. No gross focal neurologic deficits are appreciated.  5 5 strength in bilateral upper and lower extremities, no pronator drift. Skin:  Skin is warm, dry and intact. No rash noted. Psychiatric: Mood and affect are normal. Speech and behavior are normal.  ____________________________________________   LABS (all labs ordered are listed, but only abnormal results are displayed)  Labs Reviewed  BASIC METABOLIC PANEL - Abnormal; Notable for the following components:      Result Value   Sodium 113 (*)    Chloride 76 (*)    Glucose, Bld 111 (*)    Calcium 8.8 (*)    All other components within normal limits  CBC - Abnormal; Notable for the following components:   MCH 34.5 (*)    MCHC 37.5 (*)    All other components within normal limits  URINALYSIS, COMPLETE (UACMP) WITH MICROSCOPIC - Abnormal; Notable for the following components:   Color, Urine YELLOW (*)    APPearance CLEAR (*)    Ketones, ur 5 (*)     Bacteria, UA RARE (*)    All other components within normal limits  SARS CORONAVIRUS 2 (TAT 6-24 HRS)   ____________________________________________  EKG  ED ECG REPORT I, 01/03/20, the attending physician, personally viewed and interpreted this ECG.   Date: 11/03/2019  EKG Time: 14:18  Rate: 74  Rhythm: atrial fibrillation, rate 74  Axis: Normal  Intervals:none  ST&T Change: None   PROCEDURES  Procedure(s) performed (including Critical Care):  .Critical Care Performed by: 01/03/2020, MD Authorized by: Chesley Noon, MD   Critical care provider statement:    Critical care time (minutes):  45   Critical care time was exclusive of:  Separately billable procedures and treating other patients and teaching time   Critical care was necessary to treat or prevent imminent or life-threatening deterioration of the following conditions:  Metabolic crisis   Critical  care was time spent personally by me on the following activities:  Discussions with consultants, evaluation of patient's response to treatment, examination of patient, ordering and performing treatments and interventions, ordering and review of laboratory studies, ordering and review of radiographic studies, pulse oximetry, re-evaluation of patient's condition, obtaining history from patient or surrogate and review of old charts   I assumed direction of critical care for this patient from another provider in my specialty: no       ____________________________________________   INITIAL IMPRESSION / ASSESSMENT AND PLAN / ED COURSE      86 year old female with history of atrial fibrillation on Eliquis and poorly controlled hypertension presents to the ED with increasing dizziness over the past week after her PCP recently started chlorthalidone.  She is alert and oriented on exam with no focal neurologic deficits.  EKG shows rate controlled atrial fibrillation without any ischemic changes.  No symptoms to suggest  infectious process, UA is negative.  Lab work is significant for hyponatremia, which seems to be an acute change from 2 months ago at her PCP visit this is likely secondary to the chlorthalidone and we will give a normal saline bolus.  Her blood pressure is also markedly elevated, given dose of IV labetalol.  She will require admission for further monitoring of her hyponatremia as well as potential adjustment of her blood pressure medications.  Case discussed with Dr. Cherylann Ratel of nephrology, who recommends starting some confusion at 40 cc an hour with follow-up BMP approximately every 4 hours to ensure slow correction.  Normal saline bolus was canceled.  Case discussed with hospitalist, who accepts patient for admission.      ____________________________________________   FINAL CLINICAL IMPRESSION(S) / ED DIAGNOSES  Final diagnoses:  Hyponatremia  Dizziness  Essential hypertension     ED Discharge Orders    None       Note:  This document was prepared using Dragon voice recognition software and may include unintentional dictation errors.   Chesley Noon, MD 11/03/19 [phone]

## 2019-11-03 NOTE — H&P (Signed)
History and PhysicalYannely [name]   XAJ:[id number] DOB: 07-29-1924 DOA: 11/03/2019  Referring MD/provider: Dr. Charna Archer PCP: Adin Hector, MD   Patient coming from: Home  Chief Complaint: Dizziness  History of Present Illness:   Yvette [name] is an 86 y.o. female with PMH significant for HTN, hyponatremia and atrial fibrillation on Eliquis who was well until this morning when she felt dizzy.  She said she had difficulty walking which is very unusual for her.  She lives by herself in senior housing.  She denied any headache, no falls, no syncope.  Just difficulty balancing.  No recent fevers or chills.  No chest pain or cough.  No palpitations.  No abdominal pain nausea vomiting or diarrhea.  ED Course:  The patient was seen in the ED and noted to have a sodium of 113.  Of note, she had been started on chlorthalidone by her PCP more than 6 weeks ago but patient is not sure how long ago for treatment of very difficult to control hypertension.  Patient was discussed with Dr. Zollie Scale who suggested normal saline at 40 an hour with sodium checks every 4 hours.  ROS:   ROS   Review of Systems: As per HPI  Past Medical History:   Past Medical History:  Diagnosis Date  . Arthritis   . Cystocele    midline  . Hypertension   . Low sodium levels   . Osteoporosis   . Procidentia of uterus     Past Surgical History:   Past Surgical History:  Procedure Laterality Date  . NO PAST SURGERIES      Social History:   Social History   Socioeconomic History  . Marital status: Married    Spouse name: Not on file  . Number of children: Not on file  . Years of education: Not on file  . Highest education level: Not on file  Occupational History  . Occupation: retired  Tobacco Use  . Smoking status: Never Smoker  . Smokeless tobacco: Never Used  Substance and Sexual Activity  . Alcohol use: No  . Drug use: No  . Sexual activity: Never  Other Topics Concern  . Not on file   Social History Narrative  . Not on file   Social Determinants of Health   Financial Resource Strain:   . Difficulty of Paying Living Expenses: Not on file  Food Insecurity:   . Worried About Charity fundraiser in the Last Year: Not on file  . Ran Out of Food in the Last Year: Not on file  Transportation Needs:   . Lack of Transportation (Medical): Not on file  . Lack of Transportation (Non-Medical): Not on file  Physical Activity:   . Days of Exercise per Week: Not on file  . Minutes of Exercise per Session: Not on file  Stress:   . Feeling of Stress : Not on file  Social Connections:   . Frequency of Communication with Friends and Family: Not on file  . Frequency of Social Gatherings with Friends and Family: Not on file  . Attends Religious Services: Not on file  . Active Member of Clubs or Organizations: Not on file  . Attends Archivist Meetings: Not on file  . Marital Status: Not on file  Intimate Partner Violence:   . Fear of Current or Ex-Partner: Not on file  . Emotionally Abused: Not on file  . Physically Abused: Not on file  .  Sexually Abused: Not on file    Allergies   Metoprolol, Amlodipine, and Hydrocodone-acetaminophen  Family history:   Family History  Problem Relation Age of Onset  . Breast cancer Maternal Aunt   . Breast cancer Paternal Aunt   . Ovarian cancer Neg Hx   . Diabetes Neg Hx     Current Medications:   Prior to Admission medications   Medication Sig Start Date End Date Taking? Authorizing Provider  apixaban (ELIQUIS) 2.5 MG TABS tablet Take 1 tablet (2.5 mg total) by mouth 2 (two) times daily. 05/03/16  Yes Enid Baas, MD  calcium citrate-vitamin D (CITRACAL+D) 315-200 MG-UNIT tablet Take 1 tablet by mouth daily.    Yes [provider]  chlorthalidone (HYGROTON) 25 MG tablet Take 25 mg by mouth daily. 10/28/19  Yes [provider]  cloNIDine (CATAPRES) 0.1 MG tablet Take 0.1 mg by mouth daily.    Yes  [provider]  losartan (COZAAR) 100 MG tablet Take 100 mg by mouth daily.   Yes [provider]    Physical Exam:   Vitals:   11/03/19 1351 11/03/19 1354 11/03/19 1600  BP:  (!) 182/127 (!) 178/97  Pulse:  79 (!) 59  Resp:  16 17  Temp:  98 F (36.7 C)   TempSrc:  Oral   SpO2:  100% 100%  Weight: 49.9 kg    Height: 4\' 11"  (1.499 m)       Physical Exam: Blood pressure (!) 178/97, pulse (!) 59, temperature 98 F (36.7 C), temperature source Oral, resp. rate 17, height 4\' 11"  (1.499 m), weight 49.9 kg, SpO2 100 %. Gen: Frail female looking a little bit younger than stated age lying in bed in no acute distress.  A little hard of hearing. Eyes: sclera anicteric, conjuctiva mildly injected bilaterally CVS: S1-S2, regulary, no gallops Respiratory:  decreased air entry likely secondary to decreased inspiratory effort GI: NABS, soft, NT  LE: No edema. No cyanosis Neuro: A/O x 3, Moving all extremities equally with normal strength, CN 3-12 intact, grossly nonfocal.  Psych: patient is logical and coherent, judgement and insight appear normal, mood and affect appropriate to situation. Skin: no rashes or lesions or ulcers,    Data Review:    Labs: Basic Metabolic Panel: Recent Labs  Lab 11/03/19 1424  NA 113*  K 4.2  CL 76*  CO2 24  GLUCOSE 111*  BUN 15  CREATININE 0.61  CALCIUM 8.8*   Liver Function Tests: No results for input(s): AST, ALT, ALKPHOS, BILITOT, PROT, ALBUMIN in the last 168 hours. No results for input(s): LIPASE, AMYLASE in the last 168 hours. No results for input(s): AMMONIA in the last 168 hours. CBC: Recent Labs  Lab 11/03/19 1424  WBC 5.2  HGB 14.3  HCT 38.1  MCV 91.8  PLT 218   Cardiac Enzymes: No results for input(s): CKTOTAL, CKMB, CKMBINDEX, TROPONINI in the last 168 hours.  BNP (last 3 results) No results for input(s): PROBNP in the last 8760 hours. CBG: No results for input(s): GLUCAP in the last 168  hours.  Urinalysis    Component Value Date/Time   COLORURINE YELLOW (A) 11/03/2019 1424   APPEARANCEUR CLEAR (A) 11/03/2019 1424   LABSPEC 1.008 11/03/2019 1424   PHURINE 7.0 11/03/2019 1424   GLUCOSEU NEGATIVE 11/03/2019 1424   HGBUR NEGATIVE 11/03/2019 1424   BILIRUBINUR NEGATIVE 11/03/2019 1424   KETONESUR 5 (A) 11/03/2019 1424   PROTEINUR NEGATIVE 11/03/2019 1424   NITRITE NEGATIVE 11/03/2019 1424  LEUKOCYTESUR NEGATIVE 11/03/2019 1424      Radiographic Studies: No results found.  EKG: Independently reviewed.  Atrial fibrillation at 75.  Normal axis.  Nonspecific ST-T wave changes.  Diffusely flattened ST segments.   Assessment/Plan:   Principal Problem:   Hyponatremia Active Problems:   HLD (hyperlipidemia)   BP (high blood pressure)   Atrial fibrillation, chronic (HCC)  Hyponatremia Patient had an episode of hyponatremia with sodium of 116 in 2017. At that time she was on Lasix and thought to be dehydrated She was treated with tolvaptan and sodium increased from 116 to 133 overnight. We will try more gentle approach, normal saline at 40 cc an hour per discussion with Dr. Lourdes Sledge Check sodium every 4 hours. Will shoot for increase in sodium 6 to 8 mg/dL over 24 hours.  Atrial fibrillation Patient is not on any rate control medicines, but is rate controlled Continue Eliquis  Difficult to treat hypertension Pressures are pretty high, patient states she has very difficult to control high blood pressure. Discontinue chlorthalidone and losartan, both of which can cause hyponatremia Patient is intolerant to amlodipine and metoprolol per allergy package Will start hydralazine 25 mg p.o. 3 times daily and increase as warranted. DC clonidine 0.1 mg daily which is probably not very effective and not a great drug for the elderly.   Other information:   DVT prophylaxis: On Eliquis Code Status: Full Family Communication: No family to communicate with per  patient Disposition Plan: Home Consults called: Renal Admission status: Inpatient  Noretta Frier [facility]  If 7PM-7AM, please contact night-coverage [email or web address] Password [facility] 11/03/2019, 4:54 PM

## 2019-11-03 NOTE — ED Notes (Signed)
Sent rainbow to the lab. 

## 2019-11-04 ENCOUNTER — Inpatient Hospital Stay: Payer: Medicare Other

## 2019-11-04 DIAGNOSIS — E871 Hypo-osmolality and hyponatremia: Principal | ICD-10-CM

## 2019-11-04 LAB — BASIC METABOLIC PANEL
Anion gap: 11 (ref 5–15)
BUN: 13 mg/dL (ref 8–23)
CO2: 21 mmol/L — ABNORMAL LOW (ref 22–32)
Calcium: 7.9 mg/dL — ABNORMAL LOW (ref 8.9–10.3)
Chloride: 84 mmol/L — ABNORMAL LOW (ref 98–111)
Creatinine, Ser: 0.4 mg/dL — ABNORMAL LOW (ref 0.44–1.00)
GFR calc Af Amer: >60 mL/min (ref >60)
GFR calc non Af Amer: >60 mL/min (ref >60)
Glucose, Bld: 97 mg/dL (ref 70–99)
Potassium: 3.7 mmol/L (ref 3.5–5.1)
Sodium: 116 mmol/L — CL (ref 135–145)

## 2019-11-04 LAB — SODIUM
Sodium: 115 mmol/L — CL (ref 135–145)
Sodium: 115 mmol/L — CL (ref 135–145)
Sodium: 116 mmol/L — CL (ref 135–145)
Sodium: 116 mmol/L — CL (ref 135–145)
Sodium: 116 mmol/L — CL (ref 135–145)

## 2019-11-04 LAB — CBC
HCT: 36 % (ref 36.0–46.0)
Hemoglobin: 13.5 g/dL (ref 12.0–15.0)
MCH: 34.3 pg — ABNORMAL HIGH (ref 26.0–34.0)
MCHC: 37.5 g/dL — ABNORMAL HIGH (ref 30.0–36.0)
MCV: 91.4 fL (ref 80.0–100.0)
Platelets: 221 10*3/uL (ref 150–400)
RBC: 3.94 MIL/uL (ref 3.87–5.11)
RDW: 11.3 % — ABNORMAL LOW (ref 11.5–15.5)
WBC: 7.8 10*3/uL (ref 4.0–10.5)
nRBC: 0 % (ref 0.0–0.2)

## 2019-11-04 LAB — OSMOLALITY: Osmolality: 244 mOsm/kg — CL (ref 275–295)

## 2019-11-04 LAB — URIC ACID: Uric Acid, Serum: 2.8 mg/dL (ref 2.5–7.1)

## 2019-11-04 LAB — CORTISOL: Cortisol, Plasma: 22.9 ug/dL

## 2019-11-04 LAB — SARS CORONAVIRUS 2 (TAT 6-24 HRS): SARS Coronavirus 2: NEGATIVE

## 2019-11-04 LAB — TSH: TSH: 3.453 u[IU]/mL (ref 0.350–4.500)

## 2019-11-04 MED ORDER — SENNOSIDES-DOCUSATE SODIUM 8.6-50 MG PO TABS
1.0000 | ORAL_TABLET | Freq: Two times a day (BID) | ORAL | Status: DC
  Administered 2019-11-04 – 2019-11-13 (×17): 1 via ORAL
  Filled 2019-11-04 (×19): qty 1

## 2019-11-04 MED ORDER — ONDANSETRON HCL 4 MG/2ML IJ SOLN: 4.0000 mg | Freq: Four times a day (QID) | INTRAMUSCULAR | Status: DC | PRN

## 2019-11-04 MED ORDER — SODIUM CHLORIDE 0.9 % IV SOLN: INTRAVENOUS | Status: DC

## 2019-11-04 MED ORDER — ONDANSETRON HCL 4 MG/2ML IJ SOLN
INTRAMUSCULAR | Status: AC
  Administered 2019-11-04: 13:00:00 4 mg via INTRAVENOUS
  Filled 2019-11-04: qty 2

## 2019-11-04 MED ORDER — CLONIDINE HCL 0.1 MG PO TABS
0.1000 mg | ORAL_TABLET | Freq: Every day | ORAL | Status: DC
  Administered 2019-11-05 – 2019-11-06 (×2): 0.1 mg via ORAL
  Filled 2019-11-04 (×2): qty 1

## 2019-11-04 MED ORDER — SODIUM CHLORIDE 1 G PO TABS
2.0000 g | ORAL_TABLET | Freq: Three times a day (TID) | ORAL | Status: DC
  Administered 2019-11-04 – 2019-11-09 (×15): 2 g via ORAL
  Filled 2019-11-04 (×20): qty 2

## 2019-11-04 NOTE — Consult Note (Signed)
[facility] CONSULT NOTE    Date: 11/04/2019                  Patient Name:  Yvette [name]  MRN: [id number]  DOB: 05-Apr-1924  Age / Sex: 86 y.o., female         PCP: Lynnea Ferrier, MD                 Service Requesting Consult:  Hospitalist                 Reason for Consult:  Hyponatremia            History of Present Illness: Patient is a 86 y.o. female with a PMHx of hypertension, atrial fibrillation, prior episode of hyponatremia, cystocele, osteoporosis who was admitted to [facility] on 11/03/2019 for evaluation of severe hyponatremia.  Patient was relatively recently started on chlorthalidone.  When she first started she developed malaise.  However at that time she also had had her Covid vaccine.  When she went back on 10/20/2019 she was advised to restart the medication.  She comes in now feeling quite dizzy.  Upon initial evaluation she was found to have severe hyponatremia with a serum sodium of 113.  Emergency department subsequently called [place].  We advised them to start 0.9 normal saline at a slow rate of 40 cc/h.  Later this a.m. this was increased to 65 cc/h.  She currently denies vomiting or diarrhea.   Medications: Outpatient medications: Medications Prior to Admission  Medication Sig Dispense Refill Last Dose  . apixaban (ELIQUIS) 2.5 MG TABS tablet Take 1 tablet (2.5 mg total) by mouth 2 (two) times daily. 60 tablet 2 11/03/2019 at 0730  . calcium citrate-vitamin D (CITRACAL+D) 315-200 MG-UNIT tablet Take 1 tablet by mouth daily.    11/03/2019 at 0730  . chlorthalidone (HYGROTON) 25 MG tablet Take 25 mg by mouth daily.   Past Week at Unknown time  . cloNIDine (CATAPRES) 0.1 MG tablet Take 0.1 mg by mouth daily.    11/03/2019 at 0730  . losartan (COZAAR) 100 MG tablet Take 100 mg by mouth daily.   11/03/2019 at 0730    Current medications: Current Facility-Administered Medications  Medication Dose Route Frequency Provider Last Rate Last Admin  . 0.9 %   sodium chloride infusion   Intravenous Continuous Chasen Mendell, MD 65 mL/hr at 11/04/19 1101 New Bag at 11/04/19 1101  . apixaban (ELIQUIS) tablet 2.5 mg  2.5 mg Oral BID Leandro Reasoner Tublu, MD   2.5 mg at 11/04/19 0803  . Chlorhexidine Gluconate Cloth 2 % PADS 6 each  6 each Topical Q0600 Pieter Partridge, MD   6 each at 11/03/19 2000  . hydrALAZINE (APRESOLINE) tablet 25 mg  25 mg Oral TID Pieter Partridge, MD   25 mg at 11/04/19 0803  . ondansetron (ZOFRAN) injection 4 mg  4 mg Intravenous Q6H PRN Rolly Salter, MD   4 mg at 11/04/19 1310  . sodium chloride flush (NS) 0.9 % injection 3 mL  3 mL Intravenous Once Leandro Reasoner Tublu, MD      . sodium chloride tablet 2 g  2 g Oral TID WC Rolly Salter, MD   2 g at 11/04/19 1228      Allergies: Allergies  Allergen Reactions  . Metoprolol Swelling    In legs In legs   . Amlodipine Other (See Comments)    edema  . Hydrocodone-Acetaminophen Nausea Only  Past Medical History: Past Medical History:  Diagnosis Date  . Arthritis   . Cystocele    midline  . Hypertension   . Low sodium levels   . Osteoporosis   . Procidentia of uterus      Past Surgical History: Past Surgical History:  Procedure Laterality Date  . NO PAST SURGERIES       Family History: Family History  Problem Relation Age of Onset  . Breast cancer Maternal Aunt   . Breast cancer Paternal Aunt   . Ovarian cancer Neg Hx   . Diabetes Neg Hx      Social History: Social History   Socioeconomic History  . Marital status: Married    Spouse name: Not on file  . Number of children: Not on file  . Years of education: Not on file  . Highest education level: Not on file  Occupational History  . Occupation: retired  Tobacco Use  . Smoking status: Never Smoker  . Smokeless tobacco: Never Used  Substance and Sexual Activity  . Alcohol use: No  . Drug use: No  . Sexual activity: Never  Other Topics Concern  .  Not on file  Social History Narrative  . Not on file   Social Determinants of Health   Financial Resource Strain:   . Difficulty of Paying Living Expenses: Not on file  Food Insecurity:   . Worried About Programme researcher, broadcasting/film/video in the Last Year: Not on file  . Ran Out of Food in the Last Year: Not on file  Transportation Needs:   . Lack of Transportation (Medical): Not on file  . Lack of Transportation (Non-Medical): Not on file  Physical Activity:   . Days of Exercise per Week: Not on file  . Minutes of Exercise per Session: Not on file  Stress:   . Feeling of Stress : Not on file  Social Connections:   . Frequency of Communication with Friends and Family: Not on file  . Frequency of Social Gatherings with Friends and Family: Not on file  . Attends Religious Services: Not on file  . Active Member of Clubs or Organizations: Not on file  . Attends Banker Meetings: Not on file  . Marital Status: Not on file  Intimate Partner Violence:   . Fear of Current or Ex-Partner: Not on file  . Emotionally Abused: Not on file  . Physically Abused: Not on file  . Sexually Abused: Not on file     Review of Systems: Review of Systems  Constitutional: Positive for malaise/fatigue. Negative for chills and fever.  HENT: Negative for congestion, hearing loss and tinnitus.   Eyes: Negative for blurred vision and double vision.  Respiratory: Negative for cough, sputum production and shortness of breath.   Cardiovascular: Negative for chest pain, palpitations and orthopnea.  Gastrointestinal: Negative for diarrhea, nausea and vomiting.  Genitourinary: Negative for dysuria, frequency and urgency.  Musculoskeletal: Negative for myalgias.  Skin: Negative for itching and rash.  Neurological: Positive for dizziness. Negative for focal weakness.  Endo/Heme/Allergies: Negative for polydipsia. Does not bruise/bleed easily.  Psychiatric/Behavioral: Negative for depression. The patient is not  nervous/anxious.      Vital Signs: Blood pressure (!) 169/74, pulse 76, temperature 98.3 F (36.8 C), temperature source Oral, resp. rate 17, height 4\' 11"  (1.499 m), weight 49.9 kg, SpO2 99 %.  Weight trends: Filed Weights   11/03/19 1351  Weight: 49.9 kg    Physical Exam: General: NAD, laying in bed  Head: Normocephalic, atraumatic.  Eyes: Anicteric, EOMI  Nose: Mucous membranes moist, not inflammed, nonerythematous.  Throat: Oropharynx nonerythematous, no exudate appreciated.   Neck: Supple, trachea midline.  Lungs:  Normal respiratory effort. Clear to auscultation BL without crackles or wheezes.  Heart: RRR. S1 and S2 normal without gallop, murmur, or rubs.  Abdomen:  BS normoactive. Soft, Nondistended, non-tender.  No masses or organomegaly.  Extremities: No pretibial edema.  Neurologic: A&O X3, Motor strength is 5/5 in the all 4 extremities  Skin: Malar rash noted.    Lab results: Basic Metabolic Panel: Recent Labs  Lab 11/03/19 1424 11/03/19 1428 11/04/19 0414 11/04/19 0816 11/04/19 1213  NA 113*   < > 116* 115* 115*  K 4.2  --  3.7  --   --   CL 76*  --  84*  --   --   CO2 24  --  21*  --   --   GLUCOSE 111*  --  97  --   --   BUN 15  --  13  --   --   CREATININE 0.61  --  0.40*  --   --   CALCIUM 8.8*  --  7.9*  --   --    < > = values in this interval not displayed.    Liver Function Tests: No results for input(s): AST, ALT, ALKPHOS, BILITOT, PROT, ALBUMIN in the last 168 hours. No results for input(s): LIPASE, AMYLASE in the last 168 hours. No results for input(s): AMMONIA in the last 168 hours.  CBC: Recent Labs  Lab 11/03/19 1424 11/04/19 0414  WBC 5.2 7.8  HGB 14.3 13.5  HCT 38.1 36.0  MCV 91.8 91.4  PLT 218 221    Cardiac Enzymes: No results for input(s): CKTOTAL, CKMB, CKMBINDEX, TROPONINI in the last 168 hours.  BNP: Invalid input(s): POCBNP  CBG: No results for input(s): GLUCAP in the last 168 hours.  Microbiology: Results  for orders placed or performed during the hospital encounter of 11/03/19  SARS CORONAVIRUS 2 (TAT 6-24 HRS) Nasopharyngeal Nasopharyngeal Swab     Status: None   Collection Time: 11/03/19  3:20 PM   Specimen: Nasopharyngeal Swab  Result Value Ref Range Status   SARS Coronavirus 2 NEGATIVE NEGATIVE Final    Comment: (NOTE) SARS-CoV-2 target nucleic acids are NOT DETECTED. The SARS-CoV-2 RNA is generally detectable in upper and lower respiratory specimens during the acute phase of infection. Negative results do not preclude SARS-CoV-2 infection, do not rule out co-infections with other pathogens, and should not be used as the sole basis for treatment or other patient management decisions. Negative results must be combined with clinical observations, patient history, and epidemiological information. The expected result is Negative. Fact Sheet for Patients: SugarRoll.be Fact Sheet for Healthcare Providers: https://www.woods-mathews.com/ This test is not yet approved or cleared by the [place] FDA and  has been authorized for detection and/or diagnosis of SARS-CoV-2 by FDA under an Emergency Use Authorization (EUA). This EUA will remain  in effect (meaning this test can be used) for the duration of the COVID-19 declaration under Section 56 4(b)(1) of the Act, 21 U.S.C. section 360bbb-3(b)(1), unless the authorization is terminated or revoked sooner. Performed at [facility], [place] [address], [place], [place] [address]   MRSA PCR Screening     Status: None   Collection Time: 11/03/19  9:00 PM   Specimen: Nasopharyngeal  Result Value Ref Range Status   MRSA by PCR NEGATIVE NEGATIVE Final  Comment:        The GeneXpert MRSA Assay (FDA approved for NASAL specimens only), is one component of a comprehensive MRSA colonization surveillance program. It is not intended to diagnose MRSA infection nor to guide or monitor treatment  for MRSA infections. Performed at [facility], [address] [place], [place] [address]     Coagulation Studies: No results for input(s): LABPROT, INR in the last 72 hours.  Urinalysis: Recent Labs    11/03/19 1424  COLORURINE YELLOW*  LABSPEC 1.008  PHURINE 7.0  GLUCOSEU NEGATIVE  HGBUR NEGATIVE  BILIRUBINUR NEGATIVE  KETONESUR 5*  PROTEINUR NEGATIVE  NITRITE NEGATIVE  LEUKOCYTESUR NEGATIVE      Imaging:  No results found.   Assessment & Plan: Pt is a 86 y.o. female with a PMHx of hypertension, atrial fibrillation, prior episode of hyponatremia, cystocele, osteoporosis who was admitted to [facility] on 11/03/2019 for evaluation of severe hyponatremia.  1.  Hyponatremia.  Suspect secondary to use of chlorthalidone.  Sodium 113 upon admission.  Currently serum sodium is 115.  Normal saline rate was increased to 65 cc/h earlier this a.m.  We may need to increase this a bit further.  If sodium appears to plateau we may need to consider 3% saline temporarily but hold off for now.  2.  Hypertension.  Blood pressure currently 169/74.  Recommend not using thiazide diuretics any further.  Continue hydralazine 25 mg 3 times daily.  Apparently not very tolerant of beta-blockers as well.  3.  Thanks for consultation.

## 2019-11-04 NOTE — Progress Notes (Signed)
[facility] Progress Note  Patient: Yvette [name]    VOJ:[id number]  DOA: 11/03/2019     Date of Service: the patient was seen and examined on 11/04/2019  Chief Complaint  Patient presents with  . Dizziness   Brief hospital course: Yvette [name] is an 86 y.o. female with PMH significant for HTN, hyponatremia and atrial fibrillation on Eliquis who was well until this morning when she felt dizzy.  She said she had difficulty walking which is very unusual for her.  She lives by herself in senior housing. Patient was found to have hypovolemic hyponatremia. Currently further plan is to correct sodium under the guidance of nephrology.  Assessment and Plan: 1.  Hypovolemic hyponatremia Sodium 116 study for now. Nephrology consulted. Appreciate assistance. Suspect secondary to use of chlorthalidone. Continue normal saline at 65 cc/h. Currently patient is asymptomatic therefore transferring out of the stepdown unit although she may require 3% saline. Hold diuretic.  2.  Essential hypertension. Blood pressure elevated. Patient currently on hydralazine 25 mg 3 times daily. We will increase the dose. Do not use thiazides in future. Patient is on clonidine at home.  We will resume it tomorrow.  3.  Nausea X-ray abdomen unremarkable for any acute abnormality. Mild stool burden. Initiate Senokot.  4.  History of chronic A. fib Currently rate controlled. Continue Eliquis 2.5 mg twice daily per home dose.  5.  Dizziness In the setting of hyponatremia. No focal deficit. Monitor.  PT consulted.  Diet: Regular diet DVT Prophylaxis: Therapeutic Anticoagulation with Eliquis   Advance goals of care discussion: Full code  Family Communication: no family was present at bedside, at the time of interview.   Disposition:  Pt is from ILF, admitted with dizziness due to hyponatremia, still has severe hyponatremia, which precludes a safe discharge. Discharge to ALF/SNF, when sodium corrected  and PT consultation.  Subjective: Continues reporting less fatigue.  No dizziness or lightheadedness.  Reports nausea without any vomiting.  Passing gas but no BM.  No fever no chills.  No chest pain or shortness of breath.  Physical Exam: General:  alert oriented to time, place, and person.  Appear in mild distress, affect anxious Eyes: PERRL ENT: Oral Mucosa Clear, moist  Neck: no JVD,  Cardiovascular: S1 and S2 Present, no Murmur,  Respiratory: good respiratory effort, Bilateral Air entry equal and Decreased, no Crackles, no wheezes Abdomen: Bowel Sound present, Soft and no tenderness,  Skin: no rash Extremities: trace Pedal edema, no calf tenderness Neurologic: without any new focal findings Gait not checked due to patient safety concerns  Vitals:   11/04/19 1200 11/04/19 1300 11/04/19 1400 11/04/19 1615  BP: (!) 169/74 (!) 169/119 (!) 160/67 (!) 181/82  Pulse: 76 78 72 74  Resp: 17 18 15 18   Temp:    98.4 F (36.9 C)  TempSrc:    Oral  SpO2: 99% 98% 99% 96%  Weight:    45 kg  Height:    4\' 11"  (1.499 m)    Intake/Output Summary (Last 24 hours) at 11/04/2019 1644 Last data filed at 11/04/2019 0400 Gross per 24 hour  Intake 477.63 ml  Output 575 ml  Net -97.37 ml   Filed Weights   11/03/19 1351 11/04/19 1615  Weight: 49.9 kg 45 kg    Data Reviewed: I have personally reviewed and interpreted daily labs, tele strips, imagings as discussed above. I reviewed all nursing notes, pharmacy notes, vitals, pertinent old records I have discussed plan of care as described above  with RN and patient/family.  CBC: Recent Labs  Lab 11/03/19 1424 11/04/19 0414  WBC 5.2 7.8  HGB 14.3 13.5  HCT 38.1 36.0  MCV 91.8 91.4  PLT 218 008   Basic Metabolic Panel: Recent Labs  Lab 11/03/19 1424 11/03/19 1424 11/03/19 1428 11/03/19 2344 11/04/19 0414 11/04/19 0816 11/04/19 1213  NA 113*   < > 113* 116* 116* 115* 115*  K 4.2  --   --   --  3.7  --   --   CL 76*  --   --    --  84*  --   --   CO2 24  --   --   --  21*  --   --   GLUCOSE 111*  --   --   --  97  --   --   BUN 15  --   --   --  13  --   --   CREATININE 0.61  --   --   --  0.40*  --   --   CALCIUM 8.8*  --   --   --  7.9*  --   --    < > = values in this interval not displayed.    Studies: DG Abd Portable 1V  Result Date: 11/04/2019 CLINICAL DATA:  Nausea, vomiting. EXAM: PORTABLE ABDOMEN - 1 VIEW COMPARISON:  None. FINDINGS: The bowel gas pattern is normal. No radio-opaque calculi or other significant radiographic abnormality are seen. IMPRESSION: No evidence of bowel obstruction or ileus. Electronically Signed   By: Marijo Conception M.D.   On: 11/04/2019 15:09    Scheduled Meds: . apixaban  2.5 mg Oral BID  . Chlorhexidine Gluconate Cloth  6 each Topical Q0600  . hydrALAZINE  25 mg Oral TID  . senna-docusate  1 tablet Oral BID  . sodium chloride flush  3 mL Intravenous Once  . sodium chloride  2 g Oral TID WC   Continuous Infusions: . sodium chloride 65 mL/hr at 11/04/19 1101   PRN Meds: ondansetron (ZOFRAN) IV  Time spent: 35 minutes  Author: Berle Mull, MD [facility] 11/04/2019 4:44 PM  To reach On-call, see care teams to locate the attending and reach out to them via [email or web address]. If 7PM-7AM, please contact night-coverage If you still have difficulty reaching the attending provider, please page the [facility] (Director on Call) for [facility] on amion for assistance.

## 2019-11-05 LAB — CBC WITH DIFFERENTIAL/PLATELET
Abs Immature Granulocytes: 0.05 10*3/uL (ref 0.00–0.07)
Basophils Absolute: 0 10*3/uL (ref 0.0–0.1)
Basophils Relative: 0 %
Eosinophils Absolute: 0 10*3/uL (ref 0.0–0.5)
Eosinophils Relative: 0 %
HCT: 34.3 % — ABNORMAL LOW (ref 36.0–46.0)
Hemoglobin: 12.7 g/dL (ref 12.0–15.0)
Immature Granulocytes: 1 %
Lymphocytes Relative: 12 %
Lymphs Abs: 0.9 10*3/uL (ref 0.7–4.0)
MCH: 34.1 pg — ABNORMAL HIGH (ref 26.0–34.0)
MCHC: 37 g/dL — ABNORMAL HIGH (ref 30.0–36.0)
MCV: 92.2 fL (ref 80.0–100.0)
Monocytes Absolute: 0.8 10*3/uL (ref 0.1–1.0)
Monocytes Relative: 12 %
Neutro Abs: 5.2 10*3/uL (ref 1.7–7.7)
Neutrophils Relative %: 75 %
Platelets: 218 10*3/uL (ref 150–400)
RBC: 3.72 MIL/uL — ABNORMAL LOW (ref 3.87–5.11)
RDW: 11.6 % (ref 11.5–15.5)
WBC: 6.9 10*3/uL (ref 4.0–10.5)
nRBC: 0 % (ref 0.0–0.2)

## 2019-11-05 LAB — COMPREHENSIVE METABOLIC PANEL
ALT: 20 U/L (ref 0–44)
AST: 39 U/L (ref 15–41)
Albumin: 3.5 g/dL (ref 3.5–5.0)
Alkaline Phosphatase: 50 U/L (ref 38–126)
Anion gap: 8 (ref 5–15)
BUN: 8 mg/dL (ref 8–23)
CO2: 21 mmol/L — ABNORMAL LOW (ref 22–32)
Calcium: 7.9 mg/dL — ABNORMAL LOW (ref 8.9–10.3)
Chloride: 90 mmol/L — ABNORMAL LOW (ref 98–111)
Creatinine, Ser: 0.46 mg/dL (ref 0.44–1.00)
GFR calc Af Amer: >60 mL/min (ref >60)
GFR calc non Af Amer: >60 mL/min (ref >60)
Glucose, Bld: 95 mg/dL (ref 70–99)
Potassium: 3.5 mmol/L (ref 3.5–5.1)
Sodium: 119 mmol/L — CL (ref 135–145)
Total Bilirubin: 2.3 mg/dL — ABNORMAL HIGH (ref 0.3–1.2)
Total Protein: 6.3 g/dL — ABNORMAL LOW (ref 6.5–8.1)

## 2019-11-05 LAB — SODIUM
Sodium: 114 mmol/L — CL (ref 135–145)
Sodium: 118 mmol/L — CL (ref 135–145)
Sodium: 118 mmol/L — CL (ref 135–145)
Sodium: 118 mmol/L — CL (ref 135–145)
Sodium: 119 mmol/L — CL (ref 135–145)

## 2019-11-05 LAB — MAGNESIUM: Magnesium: 1.8 mg/dL (ref 1.7–2.4)

## 2019-11-05 MED ORDER — LOSARTAN POTASSIUM 50 MG PO TABS
50.0000 mg | ORAL_TABLET | Freq: Every day | ORAL | Status: DC
  Administered 2019-11-05 – 2019-11-06 (×2): 50 mg via ORAL
  Filled 2019-11-05 (×2): qty 1

## 2019-11-05 NOTE — Progress Notes (Signed)
[facility] Progress Note  Patient: Yvette [name]    FBP:[id number]  DOA: 11/03/2019     Date of Service: the patient was seen and examined on 11/05/2019  Chief Complaint  Patient presents with  . Dizziness   Brief hospital course: Yvette [name] is an 86 y.o. female with PMH significant for HTN, hyponatremia and atrial fibrillation on Eliquis who was well until this morning when she felt dizzy.  She said she had difficulty walking which is very unusual for her.  She lives by herself in senior housing. Patient was found to have hypovolemic hyponatremia. Currently further plan is to correct sodium under the guidance of nephrology.  Assessment and Plan: 1.  Hypovolemic hyponatremia Sodium on admission 113.  Currently trending to 118. Nephrology consulted. Appreciate assistance. Suspect secondary to use of chlorthalidone. Continue normal saline at 65 cc/h. Currently patient is asymptomatic therefore transferring out of the stepdown unit although she may require 3% saline. Hold diuretic.  2.  Essential hypertension. Blood pressure elevated. Patient currently on hydralazine 25 mg 3 times daily. Losartan added. Do not use thiazides in future. Patient is on clonidine at home.  We will resume it.  3.  Nausea X-ray abdomen unremarkable for any acute abnormality. Mild stool burden. Initiate Senokot.  4.  History of chronic A. fib Currently rate controlled. Continue Eliquis 2.5 mg twice daily per home dose.  5.  Dizziness In the setting of hyponatremia. No focal deficit. Monitor.  PT consulted.  Diet: Regular diet DVT Prophylaxis: Therapeutic Anticoagulation with Eliquis   Advance goals of care discussion: Full code  Family Communication: no family was present at bedside, at the time of interview.   Disposition:  Pt is from ILF, admitted with dizziness due to hyponatremia, still has severe hyponatremia, which precludes a safe discharge. Discharge to ALF/SNF, when sodium  corrected and PT consultation.  Subjective: Feeling better.  No nausea no vomiting no fever no chills.  Oral intake improving  Physical Exam: General:  alert oriented to time, place, and person.  Appear in mild distress, affect anxious Eyes: PERRL ENT: Oral Mucosa Clear, moist  Neck: no JVD,  Cardiovascular: S1 and S2 Present, no Murmur,  Respiratory: good respiratory effort, Bilateral Air entry equal and Decreased, no Crackles, no wheezes Abdomen: Bowel Sound present, Soft and no tenderness,  Skin: no rash Extremities: trace Pedal edema, no calf tenderness Neurologic: without any new focal findings Gait not checked due to patient safety concerns  Vitals:   11/05/19 0700 11/05/19 1010 11/05/19 1500 11/05/19 1630  BP: (!) 116/52 (!) 151/77 (!) 177/71 (!) 182/77  Pulse:  81 72   Resp:    19  Temp: 98.2 F (36.8 C)  98.3 F (36.8 C)   TempSrc: Oral  Oral   SpO2: 91%  100%   Weight:      Height:        Intake/Output Summary (Last 24 hours) at 11/05/2019 1907 Last data filed at 11/05/2019 1400 Gross per 24 hour  Intake 480 ml  Output --  Net 480 ml   Filed Weights   11/03/19 1351 11/04/19 1615  Weight: 49.9 kg 45 kg    Data Reviewed: I have personally reviewed and interpreted daily labs, tele strips, imagings as discussed above. I reviewed all nursing notes, pharmacy notes, vitals, pertinent old records I have discussed plan of care as described above with RN and patient/family.  CBC: Recent Labs  Lab 11/03/19 1424 11/04/19 0414 11/05/19 0459  WBC 5.2 7.8 6.9  NEUTROABS  --   --  5.2  HGB 14.3 13.5 12.7  HCT 38.1 36.0 34.3*  MCV 91.8 91.4 92.2  PLT 218 221 218   Basic Metabolic Panel: Recent Labs  Lab 11/03/19 1424 11/03/19 1428 11/04/19 0414 11/04/19 0816 11/04/19 2354 11/05/19 0459 11/05/19 0853 11/05/19 1326 11/05/19 1830  NA 113*   < > 116*   < > 118* 119* 119* 114* 118*  K 4.2  --  3.7  --   --  3.5  --   --   --   CL 76*  --  84*  --   --   90*  --   --   --   CO2 24  --  21*  --   --  21*  --   --   --   GLUCOSE 111*  --  97  --   --  95  --   --   --   BUN 15  --  13  --   --  8  --   --   --   CREATININE 0.61  --  0.40*  --   --  0.46  --   --   --   CALCIUM 8.8*  --  7.9*  --   --  7.9*  --   --   --   MG  --   --   --   --   --  1.8  --   --   --    < > = values in this interval not displayed.    Studies: No results found.  Scheduled Meds: . apixaban  2.5 mg Oral BID  . cloNIDine  0.1 mg Oral Daily  . hydrALAZINE  25 mg Oral TID  . losartan  50 mg Oral Daily  . senna-docusate  1 tablet Oral BID  . sodium chloride flush  3 mL Intravenous Once  . sodium chloride  2 g Oral TID WC   Continuous Infusions: . sodium chloride 80 mL/hr at 11/05/19 1432   PRN Meds: ondansetron (ZOFRAN) IV  Time spent: 35 minutes  Author: Lynden Oxford, MD [facility] 11/05/2019 7:07 PM  To reach On-call, see care teams to locate the attending and reach out to them via [email or web address]. If 7PM-7AM, please contact night-coverage If you still have difficulty reaching the attending provider, please page the [facility] (Director on Call) for [facility] on amion for assistance.

## 2019-11-05 NOTE — Evaluation (Signed)
Physical Therapy Evaluation Patient Details Name: Yvette [name] MRN: [id number] DOB: 1923/10/11 Today's Date: 11/05/2019   History of Present Illness  Patient admitted due to weakness, dizziness. Found to have low sodium. PMH includes OA, HTN  Clinical Impression  Patient received in room with NT present assisting her to [facility]. Patient agrees to PT assessment. Reports she is weak. She requires cues and min assist for safe transfers and with balance on/off bed side commode. She is able to ambulate 60 feet with RW and min guard. Assist needed to get positioned fully in bed when returning to room. She will continue to benefit from skilled PT while here to improve strength and safety with mobility for return home at discharge.      Follow Up Recommendations Home health PT    Equipment Recommendations  Rolling walker with 5" wheels    Recommendations for Other Services       Precautions / Restrictions Precautions Precautions: Fall Restrictions Weight Bearing Restrictions: No      Mobility  Bed Mobility Overal bed mobility: Needs Assistance Bed Mobility: Supine to Sit;Sit to Supine     Supine to sit: Min assist Sit to supine: Min assist      Transfers Overall transfer level: Needs assistance Equipment used: Rolling walker (2 wheeled) Transfers: Sit to/from Stand Sit to Stand: Min assist         General transfer comment: cues for hand placement during transfers  Ambulation/Gait Ambulation/Gait assistance: Min guard Gait Distance (Feet): 60 Feet Assistive device: Rolling walker (2 wheeled) Gait Pattern/deviations: Step-through pattern;Decreased stride length Gait velocity: decreased   General Gait Details: reliant on RW for safety and balance at this time  Stairs            Wheelchair Mobility    Modified Rankin (Stroke Patients Only)       Balance Overall balance assessment: Needs assistance Sitting-balance support: Feet supported Sitting balance-Leahy  Scale: Fair     Standing balance support: Single extremity supported;During functional activity Standing balance-Leahy Scale: Poor Standing balance comment: reliant on UE support for balance at this time.                             Pertinent Vitals/Pain Pain Assessment: No/denies pain    Home Living Family/patient expects to be discharged to:: Private residence Living Arrangements: Alone   Type of Home: House       Home Layout: One level   Additional Comments: Reports she does not use AD at baseline    Prior Function Level of Independence: Independent               Hand Dominance        Extremity/Trunk Assessment   Upper Extremity Assessment Upper Extremity Assessment: Generalized weakness    Lower Extremity Assessment Lower Extremity Assessment: Generalized weakness    Cervical / Trunk Assessment Cervical / Trunk Assessment: Normal  Communication   Communication: HOH  Cognition Arousal/Alertness: Awake/alert Behavior During Therapy: WFL for tasks assessed/performed Overall Cognitive Status: Within Functional Limits for tasks assessed                                        General Comments      Exercises     Assessment/Plan    PT Assessment Patient needs continued PT services  PT Problem List Decreased strength;Decreased mobility;Decreased  activity tolerance;Decreased balance;Decreased safety awareness;Decreased knowledge of use of DME       PT Treatment Interventions DME instruction;Therapeutic exercise;Gait training;Stair training;Functional mobility training;Therapeutic activities;Patient/family education;Balance training    PT Goals (Current goals can be found in the Care Plan section)  Acute Rehab PT Goals Patient Stated Goal: to return home PT Goal Formulation: With patient Time For Goal Achievement: 11/12/19 Potential to Achieve Goals: Good    Frequency Min 2X/week   Barriers to discharge Decreased  caregiver support      Co-evaluation               AM-PAC PT "6 Clicks" Mobility  Outcome Measure Help needed turning from your back to your side while in a flat bed without using bedrails?: A Little Help needed moving from lying on your back to sitting on the side of a flat bed without using bedrails?: A Little Help needed moving to and from a bed to a chair (including a wheelchair)?: A Little Help needed standing up from a chair using your arms (e.g., wheelchair or bedside chair)?: A Little Help needed to walk in hospital room?: A Little Help needed climbing 3-5 steps with a railing? : A Little 6 Click Score: 18    End of Session Equipment Utilized During Treatment: Gait belt Activity Tolerance: Patient tolerated treatment well Patient left: in bed;with bed alarm set;with call bell/phone within reach Nurse Communication: Mobility status PT Visit Diagnosis: Unsteadiness on feet (R26.81);Muscle weakness (generalized) (M62.81);Difficulty in walking, not elsewhere classified (R26.2)    Time: 1115-1130 PT Time Calculation (min) (ACUTE ONLY): 15 min   Charges:   PT Evaluation $PT Eval Moderate Complexity: 1 Mod PT Treatments $Gait Training: 8-22 mins        Sayde Lish, PT, GCS 11/05/19,11:44 AM

## 2019-11-05 NOTE — Progress Notes (Signed)
[facility]  ROUNDING NOTE   Subjective:  Patient moved over to floor care. Serum sodium up to 119. Feeling much better.   Objective:  Vital signs in last 24 hours:  Temp:  [97.6 F (36.4 C)-98.4 F (36.9 C)] 98.2 F (36.8 C) (03/11 0700) Pulse Rate:  [62-81] 81 (03/11 1010) Resp:  [15-18] 18 (03/10 1615) BP: (116-181)/(52-119) 151/77 (03/11 1010) SpO2:  [91 %-99 %] 91 % (03/11 0700) Weight:  [45 kg] 45 kg (03/10 1615)  Weight change: -4.899 kg Filed Weights   11/03/19 1351 11/04/19 1615  Weight: 49.9 kg 45 kg    Intake/Output: I/O last 3 completed shifts: In: 477.6 [P.O.:25; I.V.:452.6] Out: 575 [Urine:575]   Intake/Output this shift:  No intake/output data recorded.  Physical Exam: General: No acute distress  Head: Normocephalic, atraumatic. Moist oral mucosal membranes  Eyes: Anicteric  Neck: Supple, trachea midline  Lungs:  Clear to auscultation, normal effort  Heart: S1S2 no rubs  Abdomen:  Soft, nontender, bowel sounds present  Extremities: No peripheral edema.  Neurologic: Awake, alert, following commands  Skin: No lesions  Access:     Basic Metabolic Panel: Recent Labs  Lab 11/03/19 1424 11/03/19 1428 11/04/19 0414 11/04/19 0816 11/04/19 1615 11/04/19 2059 11/04/19 2354 11/05/19 0459 11/05/19 0853  NA 113*   < > 116*   < > 116* 116* 118* 119* 119*  K 4.2  --  3.7  --   --   --   --  3.5  --   CL 76*  --  84*  --   --   --   --  90*  --   CO2 24  --  21*  --   --   --   --  21*  --   GLUCOSE 111*  --  97  --   --   --   --  95  --   BUN 15  --  13  --   --   --   --  8  --   CREATININE 0.61  --  0.40*  --   --   --   --  0.46  --   CALCIUM 8.8*  --  7.9*  --   --   --   --  7.9*  --   MG  --   --   --   --   --   --   --  1.8  --    < > = values in this interval not displayed.    Liver Function Tests: Recent Labs  Lab 11/05/19 0459  AST 39  ALT 20  ALKPHOS 50  BILITOT 2.3*  PROT 6.3*  ALBUMIN 3.5   No results for  input(s): LIPASE, AMYLASE in the last 168 hours. No results for input(s): AMMONIA in the last 168 hours.  CBC: Recent Labs  Lab 11/03/19 1424 11/04/19 0414 11/05/19 0459  WBC 5.2 7.8 6.9  NEUTROABS  --   --  5.2  HGB 14.3 13.5 12.7  HCT 38.1 36.0 34.3*  MCV 91.8 91.4 92.2  PLT 218 221 218    Cardiac Enzymes: No results for input(s): CKTOTAL, CKMB, CKMBINDEX, TROPONINI in the last 168 hours.  BNP: Invalid input(s): POCBNP  CBG: No results for input(s): GLUCAP in the last 168 hours.  Microbiology: Results for orders placed or performed during the hospital encounter of 11/03/19  SARS CORONAVIRUS 2 (TAT 6-24 HRS) Nasopharyngeal Nasopharyngeal Swab     Status:  None   Collection Time: 11/03/19  3:20 PM   Specimen: Nasopharyngeal Swab  Result Value Ref Range Status   SARS Coronavirus 2 NEGATIVE NEGATIVE Final    Comment: (NOTE) SARS-CoV-2 target nucleic acids are NOT DETECTED. The SARS-CoV-2 RNA is generally detectable in upper and lower respiratory specimens during the acute phase of infection. Negative results do not preclude SARS-CoV-2 infection, do not rule out co-infections with other pathogens, and should not be used as the sole basis for treatment or other patient management decisions. Negative results must be combined with clinical observations, patient history, and epidemiological information. The expected result is Negative. Fact Sheet for Patients: HairSlick.no Fact Sheet for Healthcare Providers: quierodirigir.com This test is not yet approved or cleared by the [place] FDA and  has been authorized for detection and/or diagnosis of SARS-CoV-2 by FDA under an Emergency Use Authorization (EUA). This EUA will remain  in effect (meaning this test can be used) for the duration of the COVID-19 declaration under Section 56 4(b)(1) of the Act, 21 U.S.C. section 360bbb-3(b)(1), unless the authorization is  terminated or revoked sooner. Performed at [facility], [address] [place], [place] [address]   MRSA PCR Screening     Status: None   Collection Time: 11/03/19  9:00 PM   Specimen: Nasopharyngeal  Result Value Ref Range Status   MRSA by PCR NEGATIVE NEGATIVE Final    Comment:        The GeneXpert MRSA Assay (FDA approved for NASAL specimens only), is one component of a comprehensive MRSA colonization surveillance program. It is not intended to diagnose MRSA infection nor to guide or monitor treatment for MRSA infections. Performed at [facility], [address] [place], [place] [address]     Coagulation Studies: No results for input(s): LABPROT, INR in the last 72 hours.  Urinalysis: Recent Labs    11/03/19 1424  COLORURINE YELLOW*  LABSPEC 1.008  PHURINE 7.0  GLUCOSEU NEGATIVE  HGBUR NEGATIVE  BILIRUBINUR NEGATIVE  KETONESUR 5*  PROTEINUR NEGATIVE  NITRITE NEGATIVE  LEUKOCYTESUR NEGATIVE      Imaging: DG Abd Portable 1V  Result Date: 11/04/2019 CLINICAL DATA:  Nausea, vomiting. EXAM: PORTABLE ABDOMEN - 1 VIEW COMPARISON:  None. FINDINGS: The bowel gas pattern is normal. No radio-opaque calculi or other significant radiographic abnormality are seen. IMPRESSION: No evidence of bowel obstruction or ileus. Electronically Signed   By: Lupita Raider M.D.   On: 11/04/2019 15:09     Medications:   . sodium chloride 65 mL/hr at 11/04/19 1805   . apixaban  2.5 mg Oral BID  . cloNIDine  0.1 mg Oral Daily  . hydrALAZINE  25 mg Oral TID  . senna-docusate  1 tablet Oral BID  . sodium chloride flush  3 mL Intravenous Once  . sodium chloride  2 g Oral TID WC   ondansetron (ZOFRAN) IV  Assessment/ Plan:  86 y.o. female with a PMHx of hypertension, atrial fibrillation, prior episode of hyponatremia, cystocele, osteoporosis who was admitted to [facility] on 11/03/2019 for evaluation of severe hyponatremia.  1.  Hyponatremia.  Suspect secondary to  use of chlorthalidone.  Sodium 113 upon admission.    Serum sodium is coming up slowly.  We plan to increase sodium chloride infusion to 80 cc/h.  Continue to monitor serum sodium closely.  2.  Hypertension.  Maintain the patient on clonidine and hydralazine.  Add losartan 50 mg p.o. daily.     LOS: 2 Porshe Fleagle  3/11/202111:09 AM

## 2019-11-06 ENCOUNTER — Inpatient Hospital Stay: Payer: Medicare Other

## 2019-11-06 LAB — CBC WITH DIFFERENTIAL/PLATELET
Abs Immature Granulocytes: 0.05 10*3/uL (ref 0.00–0.07)
Basophils Absolute: 0 10*3/uL (ref 0.0–0.1)
Basophils Relative: 1 %
Eosinophils Absolute: 0.1 10*3/uL (ref 0.0–0.5)
Eosinophils Relative: 1 %
HCT: 33.3 % — ABNORMAL LOW (ref 36.0–46.0)
Hemoglobin: 11.9 g/dL — ABNORMAL LOW (ref 12.0–15.0)
Immature Granulocytes: 1 %
Lymphocytes Relative: 12 %
Lymphs Abs: 0.7 10*3/uL (ref 0.7–4.0)
MCH: 34.1 pg — ABNORMAL HIGH (ref 26.0–34.0)
MCHC: 35.7 g/dL (ref 30.0–36.0)
MCV: 95.4 fL (ref 80.0–100.0)
Monocytes Absolute: 0.8 10*3/uL (ref 0.1–1.0)
Monocytes Relative: 13 %
Neutro Abs: 4.7 10*3/uL (ref 1.7–7.7)
Neutrophils Relative %: 72 %
Platelets: 190 10*3/uL (ref 150–400)
RBC: 3.49 MIL/uL — ABNORMAL LOW (ref 3.87–5.11)
RDW: 12.3 % (ref 11.5–15.5)
WBC: 6.4 10*3/uL (ref 4.0–10.5)
nRBC: 0 % (ref 0.0–0.2)

## 2019-11-06 LAB — MAGNESIUM: Magnesium: 1.7 mg/dL (ref 1.7–2.4)

## 2019-11-06 LAB — SODIUM
Sodium: 120 mmol/L — ABNORMAL LOW (ref 135–145)
Sodium: 122 mmol/L — ABNORMAL LOW (ref 135–145)
Sodium: 123 mmol/L — ABNORMAL LOW (ref 135–145)
Sodium: 123 mmol/L — ABNORMAL LOW (ref 135–145)
Sodium: 124 mmol/L — ABNORMAL LOW (ref 135–145)

## 2019-11-06 LAB — COMPREHENSIVE METABOLIC PANEL
ALT: 17 U/L (ref 0–44)
AST: 32 U/L (ref 15–41)
Albumin: 3.3 g/dL — ABNORMAL LOW (ref 3.5–5.0)
Alkaline Phosphatase: 50 U/L (ref 38–126)
Anion gap: 3 — ABNORMAL LOW (ref 5–15)
BUN: 6 mg/dL — ABNORMAL LOW (ref 8–23)
CO2: 22 mmol/L (ref 22–32)
Calcium: 7.6 mg/dL — ABNORMAL LOW (ref 8.9–10.3)
Chloride: 96 mmol/L — ABNORMAL LOW (ref 98–111)
Creatinine, Ser: 0.51 mg/dL (ref 0.44–1.00)
GFR calc Af Amer: >60 mL/min (ref >60)
GFR calc non Af Amer: >60 mL/min (ref >60)
Glucose, Bld: 92 mg/dL (ref 70–99)
Potassium: 3.4 mmol/L — ABNORMAL LOW (ref 3.5–5.1)
Sodium: 121 mmol/L — ABNORMAL LOW (ref 135–145)
Total Bilirubin: 1.7 mg/dL — ABNORMAL HIGH (ref 0.3–1.2)
Total Protein: 5.8 g/dL — ABNORMAL LOW (ref 6.5–8.1)

## 2019-11-06 MED ORDER — HYDRALAZINE HCL 50 MG PO TABS
50.0000 mg | ORAL_TABLET | Freq: Three times a day (TID) | ORAL | Status: DC
  Administered 2019-11-06 – 2019-11-11 (×15): 50 mg via ORAL
  Filled 2019-11-06 (×16): qty 1

## 2019-11-06 MED ORDER — ISOSORBIDE MONONITRATE ER 30 MG PO TB24
30.0000 mg | ORAL_TABLET | Freq: Every day | ORAL | Status: DC
  Administered 2019-11-06 – 2019-11-07 (×2): 30 mg via ORAL
  Filled 2019-11-06 (×2): qty 1

## 2019-11-06 MED ORDER — LOSARTAN POTASSIUM 50 MG PO TABS
100.0000 mg | ORAL_TABLET | Freq: Every day | ORAL | Status: DC
  Administered 2019-11-07 – 2019-11-13 (×7): 100 mg via ORAL
  Filled 2019-11-06 (×7): qty 2

## 2019-11-06 MED ORDER — CLONIDINE HCL 0.1 MG PO TABS
0.1000 mg | ORAL_TABLET | Freq: Two times a day (BID) | ORAL | Status: DC
  Administered 2019-11-06 – 2019-11-13 (×14): 0.1 mg via ORAL
  Filled 2019-11-06 (×14): qty 1

## 2019-11-06 MED ORDER — POTASSIUM CHLORIDE CRYS ER 20 MEQ PO TBCR
40.0000 meq | EXTENDED_RELEASE_TABLET | Freq: Once | ORAL | Status: AC
  Administered 2019-11-06: 40 meq via ORAL
  Filled 2019-11-06: qty 2

## 2019-11-06 MED ORDER — HYDRALAZINE HCL 50 MG PO TABS
25.0000 mg | ORAL_TABLET | Freq: Once | ORAL | Status: AC
  Administered 2019-11-06: 17:00:00 25 mg via ORAL

## 2019-11-06 MED ORDER — ENSURE ENLIVE PO LIQD
237.0000 mL | Freq: Three times a day (TID) | ORAL | Status: DC
  Administered 2019-11-06 – 2019-11-07 (×4): 237 mL via ORAL

## 2019-11-06 MED ORDER — MAGNESIUM SULFATE 2 GM/50ML IV SOLN
2.0000 g | Freq: Once | INTRAVENOUS | Status: AC
  Administered 2019-11-06: 2 g via INTRAVENOUS
  Filled 2019-11-06: qty 50

## 2019-11-06 MED ORDER — CARVEDILOL 3.125 MG PO TABS: 3.1250 mg | ORAL_TABLET | Freq: Two times a day (BID) | ORAL | Status: DC

## 2019-11-06 MED ORDER — MEGESTROL ACETATE 400 MG/10ML PO SUSP
400.0000 mg | Freq: Every day | ORAL | Status: DC
  Administered 2019-11-06: 400 mg via ORAL
  Filled 2019-11-06: qty 10

## 2019-11-06 MED ORDER — HYDRALAZINE HCL 50 MG PO TABS: 50.0000 mg | ORAL_TABLET | Freq: Three times a day (TID) | ORAL | Status: DC

## 2019-11-06 NOTE — Care Management Important Message (Signed)
Important Message  Patient Details  Name: Yvette [name] MRN: [id number] Date of Birth: Nov 12, 1923   Medicare Important Message Given:  Yes     Olegario Messier A Sharran Caratachea 11/06/2019, 10:59 AM

## 2019-11-06 NOTE — TOC Initial Note (Signed)
Transition of Care [facility]) - Initial/Assessment Note    Patient Details  Name: Yvette [name] MRN: [id number] Date of Birth: 12/30/1923  [facility]) CM/SW Contact:    Allayne Butcher, RN Phone Number: 11/06/2019, 2:03 PM  Clinical Narrative:                 Patient admitted for hyponatremia presenting with dizziness and hypertension.  Patient is from [facility].  Patient is independent at baseline, has a walker at home.  Daughter, Boneta Lucks, is at the bedside and patient reports that Boneta Lucks helps with all her medical decisions.   PT has recommended skilled nursing for short term rehab.  Medicare approved nursing home list for [facility] given to patient and daughter to review.   Bed search started.  Unable to complete Passr as  must is down.  Patient's sodium is still low, not medically ready for discharge at this time.   Expected Discharge Plan: Skilled Nursing Facility Barriers to Discharge: Continued Medical Work up   Patient Goals and CMS Choice   CMS Medicare.gov Compare Post Acute Care list provided to:: Patient Choice offered to / list presented to : Patient  Expected Discharge Plan and Services Expected Discharge Plan: Skilled Nursing Facility   Discharge Planning Services: CM Consult Post Acute Care Choice: Skilled Nursing Facility Living arrangements for the past 2 months: Apartment, Assisted Living Facility                                      Prior Living Arrangements/Services Living arrangements for the past 2 months: Apartment, Assisted Living Facility Lives with:: Self Patient language and need for interpreter reviewed:: Yes Do you feel safe going back to the place where you live?: Yes      Need for Family Participation in Patient Care: Yes (Comment)(weakness) Care giver support system in place?: Yes (comment)(daughter) Current home services: DME(walker) Criminal Activity/Legal Involvement Pertinent to Current  Situation/Hospitalization: No - Comment as needed  Activities of Daily Living Home Assistive Devices/Equipment: None ADL Screening (condition at time of admission) Patient's cognitive ability adequate to safely complete daily activities?: No Is the patient deaf or have difficulty hearing?: Yes Does the patient have difficulty seeing, even when wearing glasses/contacts?: Yes Does the patient have difficulty concentrating, remembering, or making decisions?: No Patient able to express need for assistance with ADLs?: Yes Does the patient have difficulty dressing or bathing?: No Independently performs ADLs?: Yes (appropriate for developmental age) Does the patient have difficulty walking or climbing stairs?: No Weakness of Legs: None Weakness of Arms/Hands: None  Permission Sought/Granted Permission sought to share information with : Case Manager, Magazine features editor, Family Supports Permission granted to share information with : Yes, Verbal Permission Granted  Share Information with NAME: Boneta Lucks  Permission granted to share info w AGENCY: [facility] SNF's  Permission granted to share info w Relationship: daughter     Emotional Assessment Appearance:: Appears stated age Attitude/Demeanor/Rapport: Engaged Affect (typically observed): Accepting Orientation: : Oriented to Self, Oriented to Place, Oriented to  Time, Oriented to Situation Alcohol / Substance Use: Not Applicable Psych Involvement: No (comment)  Admission diagnosis:  Dizziness [R42] Hyponatremia [E87.1] Essential hypertension [I10] Patient Active Problem List   Diagnosis Date Noted  . Atrial fibrillation, chronic (HCC) 11/03/2019  . Grief 06/20/2016  . Hyponatremia 04/29/2016  . UTI (lower urinary tract infection) 04/29/2016  . Osteoporosis, post-menopausal  03/25/2015  . 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 Cystocele 03/25/2015  . Absolute anemia 12/14/2014  . Arthritis 12/14/2014  . HLD  (hyperlipidemia) 12/14/2014  . BP (high blood pressure) 12/14/2014  . Back ache 04/20/2014   PCP:  Adin Hector, MD Pharmacy:   [facility] [place], [place] - [address] [place] [address] Phone: [phone] Fax: [place], [place] [place] [address] Phone: [phone] Fax: [phone]     Social Determinants of Health (SDOH) Interventions    Readmission Risk Interventions No flowsheet data found.

## 2019-11-06 NOTE — Progress Notes (Addendum)
Na Saline infusing at 67ml/hr, pt noted new onset redness on BLL fingers. Pt face and upper extremities are red.Fingers are edematous as well. Pt states she does not feel good but is unable to give me an accurate description of her symptoms.  No SOB, 96% on room air. Lungs sound clear with a few crackles. No pain.   NP B.Jon Billings notified for further actions Fluid stopped per NP orders.

## 2019-11-06 NOTE — Progress Notes (Signed)
[facility]  ROUNDING NOTE   Subjective:  Serum sodium slowly rising. Currently up to 122. Patient reports that she feels washed out.   Objective:  Vital signs in last 24 hours:  Temp:  [97.1 F (36.2 C)-98.3 F (36.8 C)] 97.1 F (36.2 C) (03/11 2341) Pulse Rate:  [67-74] 74 (03/12 1015) Resp:  [19-20] 19 (03/11 2341) BP: (137-182)/(47-83) 175/83 (03/12 1015) SpO2:  [98 %-100 %] 98 % (03/11 2341)  Weight change:  Filed Weights   11/03/19 1351 11/04/19 1615  Weight: 49.9 kg 45 kg    Intake/Output: I/O last 3 completed shifts: In: 480 [P.O.:480] Out: -    Intake/Output this shift:  No intake/output data recorded.  Physical Exam: General: No acute distress  Head: Normocephalic, atraumatic. Moist oral mucosal membranes  Eyes: Anicteric  Neck: Supple, trachea midline  Lungs:  Clear to auscultation, normal effort  Heart: S1S2 no rubs  Abdomen:  Soft, nontender, bowel sounds present  Extremities: No peripheral edema.  Neurologic: Awake, alert, following commands  Skin: No lesions  Access:     Basic Metabolic Panel: Recent Labs  Lab 11/03/19 1424 11/03/19 1428 11/04/19 0414 11/04/19 0816 11/05/19 0459 11/05/19 0853 11/05/19 1830 11/05/19 1951 11/06/19 0000 11/06/19 0523 11/06/19 0821  NA 113*   < > 116*   < > 119*   < > 118* 118* 120* 121* 122*  K 4.2  --  3.7  --  3.5  --   --   --   --  3.4*  --   CL 76*  --  84*  --  90*  --   --   --   --  96*  --   CO2 24  --  21*  --  21*  --   --   --   --  22  --   GLUCOSE 111*  --  97  --  95  --   --   --   --  92  --   BUN 15  --  13  --  8  --   --   --   --  6*  --   CREATININE 0.61  --  0.40*  --  0.46  --   --   --   --  0.51  --   CALCIUM 8.8*   < > 7.9*  --  7.9*  --   --   --   --  7.6*  --   MG  --   --   --   --  1.8  --   --   --   --  1.7  --    < > = values in this interval not displayed.    Liver Function Tests: Recent Labs  Lab 11/05/19 0459 11/06/19 0523  AST 39 32  ALT 20  17  ALKPHOS 50 50  BILITOT 2.3* 1.7*  PROT 6.3* 5.8*  ALBUMIN 3.5 3.3*   No results for input(s): LIPASE, AMYLASE in the last 168 hours. No results for input(s): AMMONIA in the last 168 hours.  CBC: Recent Labs  Lab 11/03/19 1424 11/04/19 0414 11/05/19 0459 11/06/19 0523  WBC 5.2 7.8 6.9 6.4  NEUTROABS  --   --  5.2 4.7  HGB 14.3 13.5 12.7 11.9*  HCT 38.1 36.0 34.3* 33.3*  MCV 91.8 91.4 92.2 95.4  PLT 218 221 218 190    Cardiac Enzymes: No results for input(s): CKTOTAL, CKMB, CKMBINDEX, TROPONINI in the  last 168 hours.  BNP: Invalid input(s): POCBNP  CBG: No results for input(s): GLUCAP in the last 168 hours.  Microbiology: Results for orders placed or performed during the hospital encounter of 11/03/19  SARS CORONAVIRUS 2 (TAT 6-24 HRS) Nasopharyngeal Nasopharyngeal Swab     Status: None   Collection Time: 11/03/19  3:20 PM   Specimen: Nasopharyngeal Swab  Result Value Ref Range Status   SARS Coronavirus 2 NEGATIVE NEGATIVE Final    Comment: (NOTE) SARS-CoV-2 target nucleic acids are NOT DETECTED. The SARS-CoV-2 RNA is generally detectable in upper and lower respiratory specimens during the acute phase of infection. Negative results do not preclude SARS-CoV-2 infection, do not rule out co-infections with other pathogens, and should not be used as the sole basis for treatment or other patient management decisions. Negative results must be combined with clinical observations, patient history, and epidemiological information. The expected result is Negative. Fact Sheet for Patients: HairSlick.no Fact Sheet for Healthcare Providers: quierodirigir.com This test is not yet approved or cleared by the [place] FDA and  has been authorized for detection and/or diagnosis of SARS-CoV-2 by FDA under an Emergency Use Authorization (EUA). This EUA will remain  in effect (meaning this test can be used) for the  duration of the COVID-19 declaration under Section 56 4(b)(1) of the Act, 21 U.S.C. section 360bbb-3(b)(1), unless the authorization is terminated or revoked sooner. Performed at [facility], [address] [place], [place] [address]   MRSA PCR Screening     Status: None   Collection Time: 11/03/19  9:00 PM   Specimen: Nasopharyngeal  Result Value Ref Range Status   MRSA by PCR NEGATIVE NEGATIVE Final    Comment:        The GeneXpert MRSA Assay (FDA approved for NASAL specimens only), is one component of a comprehensive MRSA colonization surveillance program. It is not intended to diagnose MRSA infection nor to guide or monitor treatment for MRSA infections. Performed at [facility], [address] [place], [place] [address]     Coagulation Studies: No results for input(s): LABPROT, INR in the last 72 hours.  Urinalysis: Recent Labs    11/03/19 1424  COLORURINE YELLOW*  LABSPEC 1.008  PHURINE 7.0  GLUCOSEU NEGATIVE  HGBUR NEGATIVE  BILIRUBINUR NEGATIVE  KETONESUR 5*  PROTEINUR NEGATIVE  NITRITE NEGATIVE  LEUKOCYTESUR NEGATIVE      Imaging: DG Abd Portable 1V  Result Date: 11/04/2019 CLINICAL DATA:  Nausea, vomiting. EXAM: PORTABLE ABDOMEN - 1 VIEW COMPARISON:  None. FINDINGS: The bowel gas pattern is normal. No radio-opaque calculi or other significant radiographic abnormality are seen. IMPRESSION: No evidence of bowel obstruction or ileus. Electronically Signed   By: Lupita Raider M.D.   On: 11/04/2019 15:09     Medications:   . sodium chloride 65 mL/hr at 11/06/19 0317  . magnesium sulfate bolus IVPB     . apixaban  2.5 mg Oral BID  . cloNIDine  0.1 mg Oral Daily  . feeding supplement (ENSURE ENLIVE)  237 mL Oral TID BM  . hydrALAZINE  25 mg Oral TID  . losartan  50 mg Oral Daily  . megestrol  400 mg Oral Daily  . senna-docusate  1 tablet Oral BID  . sodium chloride flush  3 mL Intravenous Once  . sodium chloride  2 g Oral TID  WC   ondansetron (ZOFRAN) IV  Assessment/ Plan:  86 y.o. female with a PMHx of hypertension, atrial fibrillation, prior episode of  hyponatremia, cystocele, osteoporosis who was admitted to [facility] on 11/03/2019 for evaluation of severe hyponatremia.  1.  Hyponatremia.  Suspect secondary to use of chlorthalidone.  Sodium 113 upon admission.     -Serum sodium up to 122.  Sodium chloride was reduced to 65 cc/h but we will increase this back to 80 cc/h.  2.  Hypertension.  Blood pressure has been labile.  Currently 175/83.  Patient maintained on clonidine, hydralazine, and losartan.  Increase losartan to 100 mg daily.     LOS: 3 Valory Wetherby 3/12/202112:04 PM

## 2019-11-06 NOTE — Progress Notes (Signed)
[facility] Progress Note  Patient: Yvette [name]    FWY:[id number]  DOA: 11/03/2019     Date of Service: the patient was seen and examined on 11/06/2019  Chief Complaint  Patient presents with  . Dizziness   Brief hospital course: Yvette [name] is an 86 y.o. female with PMH significant for HTN, hyponatremia and atrial fibrillation on Eliquis who was well until this morning when she felt dizzy.  She said she had difficulty walking which is very unusual for her.  She lives by herself in senior housing. Patient was found to have hypovolemic hyponatremia. Currently further plan is to correct sodium under the guidance of nephrology.  Assessment and Plan: 1.  Hypovolemic hyponatremia Sodium on admission 113.  Currently trending to 123. Nephrology consulted. Appreciate assistance. Suspect secondary to use of chlorthalidone. Continue normal saline at 65 cc/h. Hold diuretic. Currently reporting shortness of breath.  Getting chest x-ray.  If volume overload may require to stop the fluid as a first step, if remains symptomatic as a second step IV Lasix and if become hyponatremic 3% saline.  Will monitor.  2.  Essential hypertension. Blood pressure elevated. Patient currently on hydralazine 25 mg 3 times daily. Losartan added. Do not use thiazides in future. Patient is on clonidine. Given her bradycardia less room to uptitrate clonidine. Hydralazine dose increased.  Imdur added.  Monitor.  3.  Nausea X-ray abdomen unremarkable for any acute abnormality. Mild stool burden. Initiate Senokot.  4.  History of chronic A. fib Currently rate controlled. Continue Eliquis 2.5 mg twice daily per home dose.  5.  Dizziness In the setting of hyponatremia. No focal deficit. Monitor.  PT consulted.  6.  Poor appetite. Poor p.o. intake. Weight loss unintentional. Added Ensure. Holding off on Megace but can add that down the road. Ensure should help with solute load as well.  Diet: Regular  diet DVT Prophylaxis: Therapeutic Anticoagulation with Eliquis   Advance goals of care discussion: Full code  Family Communication: no family was present at bedside, at the time of interview.  Discussed with daughter on the phone.  Disposition:  Pt is from ILF, admitted with dizziness due to hyponatremia, still has severe hyponatremia, which precludes a safe discharge. Discharge to ALF/SNF, when sodium corrected and PT consultation.  Subjective: Reports shortness of breath.  Reports that the heart rate was faster.  Reports fatigue and tired and being washed out.  No nausea no vomiting.  Physical Exam: General:  alert oriented to time, place, and person.  Appear in mild distress, affect anxious Eyes: PERRL ENT: Oral Mucosa Clear, moist  Neck: no JVD,  Cardiovascular: S1 and S2 Present, no Murmur,  Respiratory: good respiratory effort, Bilateral Air entry equal and Decreased, faint basal crackles, no wheezes Abdomen: Bowel Sound present, Soft and no tenderness,  Skin: no rash Extremities: trace Pedal edema, no calf tenderness Neurologic: without any new focal findings Gait not checked due to patient safety concerns  Vitals:   11/06/19 1129 11/06/19 1628 11/06/19 1700 11/06/19 1810  BP:   (!) 202/85 (!) 165/100  Pulse:  76  (!) 114  Resp:      Temp: 98.2 F (36.8 C) 98.6 F (37 C)    TempSrc: Oral Oral    SpO2:      Weight:      Height:        Intake/Output Summary (Last 24 hours) at 11/06/2019 1848 Last data filed at 11/06/2019 1826 Gross per 24 hour  Intake 240 ml  Output --  Net 240 ml   Filed Weights   11/03/19 1351 11/04/19 1615  Weight: 49.9 kg 45 kg    Data Reviewed: I have personally reviewed and interpreted daily labs, tele strips, imagings as discussed above. I reviewed all nursing notes, pharmacy notes, vitals, pertinent old records I have discussed plan of care as described above with RN and patient/family.  CBC: Recent Labs  Lab 11/03/19 1424  11/04/19 0414 11/05/19 0459 11/06/19 0523  WBC 5.2 7.8 6.9 6.4  NEUTROABS  --   --  5.2 4.7  HGB 14.3 13.5 12.7 11.9*  HCT 38.1 36.0 34.3* 33.3*  MCV 91.8 91.4 92.2 95.4  PLT 218 221 218 086   Basic Metabolic Panel: Recent Labs  Lab 11/03/19 1424 11/03/19 1428 11/04/19 0414 11/04/19 0816 11/05/19 0459 11/05/19 0853 11/06/19 0000 11/06/19 0523 11/06/19 0821 11/06/19 1244 11/06/19 1630  NA 113*   < > 116*   < > 119*   < > 120* 121* 122* 123* 123*  K 4.2  --  3.7  --  3.5  --   --  3.4*  --   --   --   CL 76*  --  84*  --  90*  --   --  96*  --   --   --   CO2 24  --  21*  --  21*  --   --  22  --   --   --   GLUCOSE 111*  --  97  --  95  --   --  92  --   --   --   BUN 15  --  13  --  8  --   --  6*  --   --   --   CREATININE 0.61  --  0.40*  --  0.46  --   --  0.51  --   --   --   CALCIUM 8.8*  --  7.9*  --  7.9*  --   --  7.6*  --   --   --   MG  --   --   --   --  1.8  --   --  1.7  --   --   --    < > = values in this interval not displayed.    Studies: No results found.  Scheduled Meds: . apixaban  2.5 mg Oral BID  . cloNIDine  0.1 mg Oral Daily  . feeding supplement (ENSURE ENLIVE)  237 mL Oral TID BM  . hydrALAZINE  50 mg Oral TID  . isosorbide mononitrate  30 mg Oral Daily  . [START ON 11/07/2019] losartan  100 mg Oral Daily  . megestrol  400 mg Oral Daily  . senna-docusate  1 tablet Oral BID  . sodium chloride flush  3 mL Intravenous Once  . sodium chloride  2 g Oral TID WC   Continuous Infusions: . sodium chloride 80 mL/hr at 11/06/19 1713   PRN Meds: ondansetron (ZOFRAN) IV  Time spent: 35 minutes  Author: Berle Mull, MD [facility] 11/06/2019 6:48 PM  To reach On-call, see care teams to locate the attending and reach out to them via [email or web address]. If 7PM-7AM, please contact night-coverage If you still have difficulty reaching the attending provider, please page the [facility] (Director on Call) for [facility] on amion for assistance.

## 2019-11-06 NOTE — NC FL2 (Addendum)
[place] MEDICAID FL2 LEVEL OF CARE SCREENING TOOL     IDENTIFICATION  Patient Name: Yvette [name] Birthdate: 1923-09-11 Sex: female Admission Date (Current Location): 11/03/2019  [place] and [place] Number:  Chiropodist and Address:  [facility], [address], [place], [place] [address]      Provider Number: [id number]  Attending Physician Name and Address:  Rolly Salter, MD  Relative Name and Phone Number:  Wyatt Haste - daughter [phone]    Current Level of Care: Hospital Recommended Level of Care: Skilled Nursing Facility Prior Approval Number:    Date Approved/Denied:   PASRR Number:  [id number]  Discharge Plan: SNF    Current Diagnoses: Patient Active Problem List   Diagnosis Date Noted  . Atrial fibrillation, chronic (HCC) 11/03/2019  . Grief 06/20/2016  . Hyponatremia 04/29/2016  . UTI (lower urinary tract infection) 04/29/2016  . 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 Cystocele 03/25/2015  . Absolute anemia 12/14/2014  . Arthritis 12/14/2014  . HLD (hyperlipidemia) 12/14/2014  . BP (high blood pressure) 12/14/2014  . Back ache 04/20/2014    Orientation RESPIRATION BLADDER Height & Weight     Self, Time, Situation, Place  Normal Continent Weight: 45 kg Height:  4\' 11"  (149.9 cm)  BEHAVIORAL SYMPTOMS/MOOD NEUROLOGICAL BOWEL NUTRITION STATUS      Continent Diet(Regular)  AMBULATORY STATUS COMMUNICATION OF NEEDS Skin   Limited Assist Verbally Bruising(Both hands and arms- patient also has rosacia (facial redness))                       Personal Care Assistance Level of Assistance  Bathing, Feeding, Dressing Bathing Assistance: Limited assistance Feeding assistance: Independent Dressing Assistance: Limited assistance     Functional Limitations Info  Hearing   Hearing Info: Impaired      SPECIAL CARE FACTORS FREQUENCY  PT (By licensed PT), OT (By licensed  OT)     PT Frequency: 5 times per week OT Frequency: 3 times per week            Contractures Contractures Info: Not present    Additional Factors Info  Code Status, Allergies Code Status Info: Full Allergies Info: Metoprolol, amlodipine, hydrocodone- acetaminophen           Current Medications (11/06/2019):  This is the current hospital active medication list Current Facility-Administered Medications  Medication Dose Route Frequency Provider Last Rate Last Admin  . 0.9 %  sodium chloride infusion   Intravenous Continuous Lateef, Munsoor, MD 80 mL/hr at 11/06/19 1228 Rate Change at 11/06/19 1228  . apixaban (ELIQUIS) tablet 2.5 mg  2.5 mg Oral BID 01/06/20 Tublu, MD   2.5 mg at 11/06/19 1017  . cloNIDine (CATAPRES) tablet 0.1 mg  0.1 mg Oral Daily 01/06/20, MD   0.1 mg at 11/06/19 1017  . feeding supplement (ENSURE ENLIVE) (ENSURE ENLIVE) liquid 237 mL  237 mL Oral TID BM 01/06/20, MD      . hydrALAZINE (APRESOLINE) tablet 25 mg  25 mg Oral TID Rolly Salter Tublu, MD   25 mg at 11/06/19 1017  . [START ON 11/07/2019] losartan (COZAAR) tablet 100 mg  100 mg Oral Daily Lateef, Munsoor, MD      . megestrol (MEGACE) 400 MG/10ML suspension 400 mg  400 mg Oral Daily 11/09/2019, MD      . ondansetron [facility]) injection 4 mg  4 mg Intravenous Q6H PRN [facility]  M, MD   4 mg at 11/04/19 1310  . senna-docusate (Senokot-S) tablet 1 tablet  1 tablet Oral BID Lavina Hamman, MD   1 tablet at 11/06/19 1017  . sodium chloride flush (NS) 0.9 % injection 3 mL  3 mL Intravenous Once [facility] Tublu, MD      . sodium chloride tablet 2 g  2 g Oral TID WC Lavina Hamman, MD   2 g at 11/06/19 1228     Discharge Medications: Please see discharge summary for a list of discharge medications.  Relevant Imaging Results:  Relevant Lab Results:   Additional Information SS# [id number]  Shelbie Hutching, RN

## 2019-11-06 NOTE — Progress Notes (Signed)
Physical Therapy Treatment Patient Details Name: Yvette [name] MRN: [id number] DOB: [birth year] Today's Date: 11/06/2019    History of Present Illness Patient admitted due to weakness, dizziness. Found to have low sodium. PMH includes OA, HTN    PT Comments    Pt is making limited progress towards goals this date. Pt reports she feels washed out and sluggish. Urgent need to get to [facility] for BM. This writer assisted to [facility], however pt very unsteady and needed assist for all mobility. HR increased sharply to 152bpm with transfer/hygiene and then sustained in 120s/130s. Further ambulation deferred at this time. Assisted back to bed and RN called for continued hygiene attempts. Currently changing recommendation to SNF at this time due to increased physical assist needed. Will continue to progress as able.  Follow Up Recommendations  SNF     Equipment Recommendations  Rolling walker with 5" wheels    Recommendations for Other Services       Precautions / Restrictions Precautions Precautions: Fall Restrictions Weight Bearing Restrictions: No    Mobility  Bed Mobility Overal bed mobility: Needs Assistance Bed Mobility: Supine to Sit;Sit to Supine     Supine to sit: Min assist Sit to supine: Min assist   General bed mobility comments: needs assist for B LE management. Once seated at EOB, able to demonstrate upright posture. Fatigues with exertion and HR increases to 140s with sitting at EOB.  Transfers Overall transfer level: Needs assistance Equipment used: Rolling walker (2 wheeled) Transfers: Sit to/from Stand Sit to Stand: Min assist         General transfer comment: needs cues for hand placement and demonstrates unsteadiness with standing. Needs hands on assist for all mobility.  Ambulation/Gait Ambulation/Gait assistance: Min assist;Mod assist Gait Distance (Feet): 3 Feet Assistive device: 1 person hand held assist Gait Pattern/deviations: Step-to pattern     General  Gait Details: ambulated to [facility] with heavy cues for sequencing and generalized unsteadiness.  Due to increased HR, then min assist provided to return back to bed.   Stairs             Wheelchair Mobility    Modified Rankin (Stroke Patients Only)       Balance Overall balance assessment: Needs assistance Sitting-balance support: Feet supported Sitting balance-Leahy Scale: Fair     Standing balance support: Single extremity supported;During functional activity Standing balance-Leahy Scale: Poor                              Cognition Arousal/Alertness: Awake/alert Behavior During Therapy: WFL for tasks assessed/performed Overall Cognitive Status: Within Functional Limits for tasks assessed                                        Exercises Other Exercises Other Exercises: ambulated over to [facility] with unsteadiness. Pt with diarrhea and has difficulty maintaining balance for hygiene. HR increases to 152bpm with exertion.    General Comments        Pertinent Vitals/Pain Pain Assessment: No/denies pain    Home Living                      Prior Function            PT Goals (current goals can now be found in the care plan section) Acute Rehab PT Goals Patient Stated Goal:  to return home PT Goal Formulation: With patient Time For Goal Achievement: 11/12/19 Potential to Achieve Goals: Good Progress towards PT goals: Progressing toward goals    Frequency    Min 2X/week      PT Plan Discharge plan needs to be updated    Co-evaluation              AM-PAC PT "6 Clicks" Mobility   Outcome Measure  Help needed turning from your back to your side while in a flat bed without using bedrails?: A Little Help needed moving from lying on your back to sitting on the side of a flat bed without using bedrails?: A Little Help needed moving to and from a bed to a chair (including a wheelchair)?: A Little Help needed standing up  from a chair using your arms (e.g., wheelchair or bedside chair)?: A Little Help needed to walk in hospital room?: A Lot Help needed climbing 3-5 steps with a railing? : Total 6 Click Score: 15    End of Session   Activity Tolerance: Treatment limited secondary to medical complications (Comment) Patient left: in bed;with bed alarm set;with call bell/phone within reach Nurse Communication: Mobility status PT Visit Diagnosis: Unsteadiness on feet (R26.81);Muscle weakness (generalized) (M62.81);Difficulty in walking, not elsewhere classified (R26.2)     Time: 2426-8341 PT Time Calculation (min) (ACUTE ONLY): 16 min  Charges:  $Therapeutic Activity: 8-22 mins                     Elizabeth Palau, PT, DPT [phone]    Yvette [name] 11/06/2019, 12:40 PM

## 2019-11-07 LAB — SODIUM
Sodium: 123 mmol/L — ABNORMAL LOW (ref 135–145)
Sodium: 124 mmol/L — ABNORMAL LOW (ref 135–145)
Sodium: 125 mmol/L — ABNORMAL LOW (ref 135–145)
Sodium: 126 mmol/L — ABNORMAL LOW (ref 135–145)
Sodium: 127 mmol/L — ABNORMAL LOW (ref 135–145)

## 2019-11-07 MED ORDER — POLYETHYLENE GLYCOL 3350 17 G PO PACK
17.0000 g | PACK | Freq: Every day | ORAL | Status: DC
  Administered 2019-11-07 – 2019-11-13 (×7): 17 g via ORAL
  Filled 2019-11-07 (×6): qty 1

## 2019-11-07 NOTE — Progress Notes (Signed)
[facility]  ROUNDING NOTE   Subjective:   Na 125 IVF stopped last night due to shortness of breath and peripheral edema.    Objective:  Vital signs in last 24 hours:  Temp:  [97.8 F (36.6 C)-98.6 F (37 C)] 97.8 F (36.6 C) (03/13 1132) Pulse Rate:  [29-160] 77 (03/13 1132) Resp:  [13-39] 16 (03/13 1132) BP: (138-202)/(37-125) 138/125 (03/13 0810) SpO2:  [93 %-100 %] 98 % (03/13 1132)  Weight change:  Filed Weights   11/03/19 1351 11/04/19 1615  Weight: 49.9 kg 45 kg    Intake/Output: I/O last 3 completed shifts: In: 4272.2 [P.O.:717; I.V.:3555.2] Out: 200 [Urine:200]   Intake/Output this shift:  Total I/O In: 240 [P.O.:240] Out: -   Physical Exam: General: No acute distress  Head: Normocephalic, atraumatic. Moist oral mucosal membranes  Eyes: Anicteric  Neck: Supple, trachea midline  Lungs:  Clear to auscultation, normal effort  Heart: S1S2 no rubs  Abdomen:  Soft, nontender, bowel sounds present  Extremities: + edema  Neurologic: Awake, alert, following commands  Skin: No lesions        Basic Metabolic Panel: Recent Labs  Lab 11/03/19 1424 11/03/19 1428 11/04/19 0414 11/04/19 0816 11/05/19 0459 11/05/19 0853 11/06/19 0523 11/06/19 0821 11/06/19 1244 11/06/19 1630 11/06/19 1957 11/07/19 0025 11/07/19 0719  NA 113*   < > 116*   < > 119*   < > 121*   < > 123* 123* 124* 124* 125*  K 4.2  --  3.7  --  3.5  --  3.4*  --   --   --   --   --   --   CL 76*  --  84*  --  90*  --  96*  --   --   --   --   --   --   CO2 24  --  21*  --  21*  --  22  --   --   --   --   --   --   GLUCOSE 111*  --  97  --  95  --  92  --   --   --   --   --   --   BUN 15  --  13  --  8  --  6*  --   --   --   --   --   --   CREATININE 0.61  --  0.40*  --  0.46  --  0.51  --   --   --   --   --   --   CALCIUM 8.8*   < > 7.9*  --  7.9*  --  7.6*  --   --   --   --   --   --   MG  --   --   --   --  1.8  --  1.7  --   --   --   --   --   --    < > = values  in this interval not displayed.    Liver Function Tests: Recent Labs  Lab 11/05/19 0459 11/06/19 0523  AST 39 32  ALT 20 17  ALKPHOS 50 50  BILITOT 2.3* 1.7*  PROT 6.3* 5.8*  ALBUMIN 3.5 3.3*   No results for input(s): LIPASE, AMYLASE in the last 168 hours. No results for input(s): AMMONIA in the last 168 hours.  CBC: Recent Labs  Lab  11/03/19 1424 11/04/19 0414 11/05/19 0459 11/06/19 0523  WBC 5.2 7.8 6.9 6.4  NEUTROABS  --   --  5.2 4.7  HGB 14.3 13.5 12.7 11.9*  HCT 38.1 36.0 34.3* 33.3*  MCV 91.8 91.4 92.2 95.4  PLT 218 221 218 190    Cardiac Enzymes: No results for input(s): CKTOTAL, CKMB, CKMBINDEX, TROPONINI in the last 168 hours.  BNP: Invalid input(s): POCBNP  CBG: No results for input(s): GLUCAP in the last 168 hours.  Microbiology: Results for orders placed or performed during the hospital encounter of 11/03/19  SARS CORONAVIRUS 2 (TAT 6-24 HRS) Nasopharyngeal Nasopharyngeal Swab     Status: None   Collection Time: 11/03/19  3:20 PM   Specimen: Nasopharyngeal Swab  Result Value Ref Range Status   SARS Coronavirus 2 NEGATIVE NEGATIVE Final    Comment: (NOTE) SARS-CoV-2 target nucleic acids are NOT DETECTED. The SARS-CoV-2 RNA is generally detectable in upper and lower respiratory specimens during the acute phase of infection. Negative results do not preclude SARS-CoV-2 infection, do not rule out co-infections with other pathogens, and should not be used as the sole basis for treatment or other patient management decisions. Negative results must be combined with clinical observations, patient history, and epidemiological information. The expected result is Negative. Fact Sheet for Patients: HairSlick.no Fact Sheet for Healthcare Providers: quierodirigir.com This test is not yet approved or cleared by the [place] FDA and  has been authorized for detection and/or diagnosis of SARS-CoV-2  by FDA under an Emergency Use Authorization (EUA). This EUA will remain  in effect (meaning this test can be used) for the duration of the COVID-19 declaration under Section 56 4(b)(1) of the Act, 21 U.S.C. section 360bbb-3(b)(1), unless the authorization is terminated or revoked sooner. Performed at [facility], [address] [place], [place] [address]   MRSA PCR Screening     Status: None   Collection Time: 11/03/19  9:00 PM   Specimen: Nasopharyngeal  Result Value Ref Range Status   MRSA by PCR NEGATIVE NEGATIVE Final    Comment:        The GeneXpert MRSA Assay (FDA approved for NASAL specimens only), is one component of a comprehensive MRSA colonization surveillance program. It is not intended to diagnose MRSA infection nor to guide or monitor treatment for MRSA infections. Performed at [facility], [address] [place], [place] [address]     Coagulation Studies: No results for input(s): LABPROT, INR in the last 72 hours.  Urinalysis: No results for input(s): COLORURINE, LABSPEC, PHURINE, GLUCOSEU, HGBUR, BILIRUBINUR, KETONESUR, PROTEINUR, UROBILINOGEN, NITRITE, LEUKOCYTESUR in the last 72 hours.  Invalid input(s): APPERANCEUR    Imaging: DG Chest Port 1 View  Result Date: 11/06/2019 CLINICAL DATA:  Shortness of breath EXAM: PORTABLE CHEST 1 VIEW COMPARISON:  09/18/2006 FINDINGS: Cardiac shadow is mildly enlarged. Aortic calcifications are seen. The lungs are well aerated bilaterally. Nipple shadows are noted. No acute bony abnormality is seen. IMPRESSION: No acute abnormality noted. Electronically Signed   By: Alcide Clever M.D.   On: 11/06/2019 19:00     Medications:    . apixaban  2.5 mg Oral BID  . cloNIDine  0.1 mg Oral BID  . feeding supplement (ENSURE ENLIVE)  237 mL Oral TID BM  . hydrALAZINE  50 mg Oral TID  . isosorbide mononitrate  30 mg Oral Daily  . losartan  100 mg Oral Daily  . senna-docusate  1 tablet Oral BID  . sodium  chloride  flush  3 mL Intravenous Once  . sodium chloride  2 g Oral TID WC   ondansetron (ZOFRAN) IV  Assessment/ Plan:  86 y.o. female with a PMHx of hypertension, atrial fibrillation, prior episode of hyponatremia, cystocele, osteoporosis who was admitted to [facility] on 11/03/2019 for evaluation of severe hyponatremia.  1.  Hyponatremia.  Suspect secondary to use of chlorthalidone.  Sodium 113 on admission.     - off IV saline infusion.  - salt tabs.   2.  Hypertension.  Blood pressure has been labile.   Current regimen of clonidine, hydralazine, and losartan.     LOS: 4 Cordae Mccarey 3/13/202112:42 PM

## 2019-11-07 NOTE — Progress Notes (Signed)
Bedside shift report received from [place], [place]. Morning assessment completed. Pt. Sitting up in bed talking calmly. Denies any pain or needs at this time. Call light is within reach, will continue to monitor.

## 2019-11-07 NOTE — Progress Notes (Signed)
Morning medications administered per orders. Pt. Sitting up in bed working on breakfast. Denies any further needs at this time. Call light is within reach, will continue to monitor.

## 2019-11-07 NOTE — Progress Notes (Signed)
[facility] Progress Note  Patient: Yvette [name]    XNA:[id number]  DOA: 11/03/2019     Date of Service: the patient was seen and examined on 11/07/2019  Chief Complaint  Patient presents with  . Dizziness   Brief hospital course: Yvette [name] is an 86 y.o. female with PMH significant for HTN, hyponatremia and atrial fibrillation on Eliquis who was well until this morning when she felt dizzy.  She said she had difficulty walking which is very unusual for her.  She lives by herself in senior housing. Patient was found to have hypovolemic hyponatremia. Currently further plan is to correct sodium under the guidance of nephrology.  Assessment and Plan: 1.  Hypovolemic hyponatremia Sodium on admission 113.  Currently trending to 120s. Nephrology consulted. Appreciate assistance. Suspect secondary to use of chlorthalidone. Treated with normal saline. Now only on salt tabs.  Hold diuretic.  2.  Essential hypertension. Blood pressure elevated. Patient currently on hydralazine 25 mg 3 times daily. Losartan added. Do not use thiazides in future. Patient is on clonidine. Given her bradycardia less room to uptitrate clonidine. Hydralazine dose increased.  Imdur added. Monitor.  3.  Nausea X-ray abdomen unremarkable for any acute abnormality. Mild stool burden. Initiate Senokot.  4.  History of chronic A. fib Currently rate controlled. Continue Eliquis 2.5 mg twice daily per home dose.  5.  Dizziness In the setting of hyponatremia. No focal deficit. Monitor. PT consulted.  6.  Poor appetite. Poor p.o. intake. Weight loss unintentional. Added Ensure. Holding off on Megace but can add that down the road. Ensure should help with solute load as well.  Diet: Regular diet DVT Prophylaxis: Therapeutic Anticoagulation with Eliquis   Advance goals of care discussion: Full code  Family Communication: no family was present at bedside, at the time of interview.  Discussed with  daughter on the phone.  Disposition:  Pt is from ILF, admitted with dizziness due to hyponatremia, still has severe hyponatremia, which precludes a safe discharge. Discharge to ALF/SNF, when sodium corrected and PT consultation.  Subjective: Feeling better.  No nausea no vomiting.  Still constipated.  Oral intake improving.  Drinking Ensure.  Physical Exam: General:  alert oriented to time, place, and person.  Appear in mild distress, affect anxious Eyes: PERRL ENT: Oral Mucosa Clear, moist  Neck: no JVD,  Cardiovascular: S1 and S2 Present, no Murmur,  Respiratory: good respiratory effort, Bilateral Air entry equal and Decreased, faint basal crackles, no wheezes Abdomen: Bowel Sound present, Soft and no tenderness,  Skin: no rash Extremities: trace Pedal edema, no calf tenderness Neurologic: without any new focal findings Gait not checked due to patient safety concerns  Vitals:   11/07/19 1130 11/07/19 1131 11/07/19 1132 11/07/19 1310  BP:    (!) 107/55  Pulse: 80 79 77 71  Resp: 20 20 16  (!) 23  Temp:   97.8 F (36.6 C) (!) 97.3 F (36.3 C)  TempSrc:   Oral Oral  SpO2: 98% 97% 98% 99%  Weight:      Height:        Intake/Output Summary (Last 24 hours) at 11/07/2019 1518 Last data filed at 11/07/2019 1408 Gross per 24 hour  Intake 4632.15 ml  Output 200 ml  Net 4432.15 ml   Filed Weights   11/03/19 1351 11/04/19 1615  Weight: 49.9 kg 45 kg    Data Reviewed: I have personally reviewed and interpreted daily labs, tele strips, imagings as discussed above. I reviewed all nursing notes, pharmacy  notes, vitals, pertinent old records I have discussed plan of care as described above with RN and patient/family.  CBC: Recent Labs  Lab 11/03/19 1424 11/04/19 0414 11/05/19 0459 11/06/19 0523  WBC 5.2 7.8 6.9 6.4  NEUTROABS  --   --  5.2 4.7  HGB 14.3 13.5 12.7 11.9*  HCT 38.1 36.0 34.3* 33.3*  MCV 91.8 91.4 92.2 95.4  PLT 218 221 218 517   Basic Metabolic  Panel: Recent Labs  Lab 11/03/19 1424 11/03/19 1428 11/04/19 0414 11/04/19 0816 11/05/19 0459 11/05/19 0853 11/06/19 0523 11/06/19 0821 11/06/19 1630 11/06/19 1957 11/07/19 0025 11/07/19 0719 11/07/19 1324  NA 113*   < > 116*   < > 119*   < > 121*   < > 123* 124* 124* 125* 123*  K 4.2  --  3.7  --  3.5  --  3.4*  --   --   --   --   --   --   CL 76*  --  84*  --  90*  --  96*  --   --   --   --   --   --   CO2 24  --  21*  --  21*  --  22  --   --   --   --   --   --   GLUCOSE 111*  --  97  --  95  --  92  --   --   --   --   --   --   BUN 15  --  13  --  8  --  6*  --   --   --   --   --   --   CREATININE 0.61  --  0.40*  --  0.46  --  0.51  --   --   --   --   --   --   CALCIUM 8.8*  --  7.9*  --  7.9*  --  7.6*  --   --   --   --   --   --   MG  --   --   --   --  1.8  --  1.7  --   --   --   --   --   --    < > = values in this interval not displayed.    Studies: DG Chest Port 1 View  Result Date: 11/06/2019 CLINICAL DATA:  Shortness of breath EXAM: PORTABLE CHEST 1 VIEW COMPARISON:  09/18/2006 FINDINGS: Cardiac shadow is mildly enlarged. Aortic calcifications are seen. The lungs are well aerated bilaterally. Nipple shadows are noted. No acute bony abnormality is seen. IMPRESSION: No acute abnormality noted. Electronically Signed   By: Inez Catalina M.D.   On: 11/06/2019 19:00    Scheduled Meds: . apixaban  2.5 mg Oral BID  . cloNIDine  0.1 mg Oral BID  . feeding supplement (ENSURE ENLIVE)  237 mL Oral TID BM  . hydrALAZINE  50 mg Oral TID  . isosorbide mononitrate  30 mg Oral Daily  . losartan  100 mg Oral Daily  . senna-docusate  1 tablet Oral BID  . sodium chloride flush  3 mL Intravenous Once  . sodium chloride  2 g Oral TID WC   Continuous Infusions:  PRN Meds: ondansetron (ZOFRAN) IV  Time spent: 35 minutes  Author: Berle Mull, MD [facility] 11/07/2019 3:18 PM  To reach On-call,  see care teams to locate the attending and reach out to them via  [email or web address]. If 7PM-7AM, please contact night-coverage If you still have difficulty reaching the attending provider, please page the [facility] (Director on Call) for [facility] on amion for assistance.

## 2019-11-07 NOTE — Progress Notes (Signed)
Edema significantly reduced following fluid being stopped Per NP orders. Pt skin color is appropriate for ethnicity. Pt states she feels better.

## 2019-11-08 LAB — COMPREHENSIVE METABOLIC PANEL
ALT: 20 U/L (ref 0–44)
AST: 32 U/L (ref 15–41)
Albumin: 3.7 g/dL (ref 3.5–5.0)
Alkaline Phosphatase: 61 U/L (ref 38–126)
Anion gap: 8 (ref 5–15)
BUN: 14 mg/dL (ref 8–23)
CO2: 21 mmol/L — ABNORMAL LOW (ref 22–32)
Calcium: 8.2 mg/dL — ABNORMAL LOW (ref 8.9–10.3)
Chloride: 92 mmol/L — ABNORMAL LOW (ref 98–111)
Creatinine, Ser: 0.41 mg/dL — ABNORMAL LOW (ref 0.44–1.00)
GFR calc Af Amer: >60 mL/min (ref >60)
GFR calc non Af Amer: >60 mL/min (ref >60)
Glucose, Bld: 116 mg/dL — ABNORMAL HIGH (ref 70–99)
Potassium: 4.7 mmol/L (ref 3.5–5.1)
Sodium: 121 mmol/L — ABNORMAL LOW (ref 135–145)
Total Bilirubin: 1.6 mg/dL — ABNORMAL HIGH (ref 0.3–1.2)
Total Protein: 6.5 g/dL (ref 6.5–8.1)

## 2019-11-08 LAB — SODIUM
Sodium: 121 mmol/L — ABNORMAL LOW (ref 135–145)
Sodium: 122 mmol/L — ABNORMAL LOW (ref 135–145)
Sodium: 122 mmol/L — ABNORMAL LOW (ref 135–145)
Sodium: 125 mmol/L — ABNORMAL LOW (ref 135–145)
Sodium: 127 mmol/L — ABNORMAL LOW (ref 135–145)

## 2019-11-08 LAB — CBC
HCT: 35.2 % — ABNORMAL LOW (ref 36.0–46.0)
Hemoglobin: 12.2 g/dL (ref 12.0–15.0)
MCH: 34.5 pg — ABNORMAL HIGH (ref 26.0–34.0)
MCHC: 34.7 g/dL (ref 30.0–36.0)
MCV: 99.4 fL (ref 80.0–100.0)
Platelets: 197 10*3/uL (ref 150–400)
RBC: 3.54 MIL/uL — ABNORMAL LOW (ref 3.87–5.11)
RDW: 12.8 % (ref 11.5–15.5)
WBC: 9.6 10*3/uL (ref 4.0–10.5)
nRBC: 0 % (ref 0.0–0.2)

## 2019-11-08 LAB — OSMOLALITY: Osmolality: 262 mOsm/kg — ABNORMAL LOW (ref 275–295)

## 2019-11-08 MED ORDER — PRO-STAT SUGAR FREE PO LIQD
30.0000 mL | Freq: Two times a day (BID) | ORAL | Status: DC
  Administered 2019-11-08 – 2019-11-13 (×9): 30 mL via ORAL

## 2019-11-08 MED ORDER — BOOST / RESOURCE BREEZE PO LIQD CUSTOM
1.0000 | Freq: Three times a day (TID) | ORAL | Status: DC
  Administered 2019-11-08 – 2019-11-13 (×11): 1 via ORAL

## 2019-11-08 MED ORDER — FUROSEMIDE 10 MG/ML IJ SOLN
20.0000 mg | Freq: Once | INTRAMUSCULAR | Status: AC
  Administered 2019-11-08: 13:00:00 20 mg via INTRAVENOUS
  Filled 2019-11-08: qty 2

## 2019-11-08 NOTE — Progress Notes (Signed)
[facility]  ROUNDING NOTE   Subjective:   Na 122 Complains of peripheral edema  Objective:  Vital signs in last 24 hours:  Temp:  [97.3 F (36.3 C)-97.9 F (36.6 C)] 97.8 F (36.6 C) (03/13 2354) Pulse Rate:  [49-82] 49 (03/14 0100) Resp:  [13-23] 19 (03/14 0100) BP: (107-140)/(40-58) 116/40 (03/14 0000) SpO2:  [96 %-99 %] 96 % (03/14 0100)  Weight change:  Filed Weights   11/03/19 1351 11/04/19 1615  Weight: 49.9 kg 45 kg    Intake/Output: I/O last 3 completed shifts: In: 4512.2 [P.O.:957; I.V.:3555.2] Out: 200 [Urine:200]   Intake/Output this shift:  No intake/output data recorded.  Physical Exam: General: No acute distress  Head: Normocephalic, atraumatic. Moist oral mucosal membranes  Eyes: Anicteric  Neck: Supple, trachea midline  Lungs:  Clear to auscultation, normal effort  Heart: irregular  Abdomen:  Soft, nontender, bowel sounds present  Extremities: + edema  Neurologic: Awake, alert, following commands  Skin: No lesions        Basic Metabolic Panel: Recent Labs  Lab 11/03/19 1424 11/03/19 1428 11/04/19 0414 11/04/19 0816 11/05/19 0459 11/05/19 0853 11/06/19 0523 11/06/19 0821 11/07/19 1556 11/07/19 2003 11/07/19 2355 11/08/19 0426 11/08/19 0912  NA 113*   < > 116*   < > 119*   < > 121*   < > 126* 127* 125* 127* 122*  K 4.2  --  3.7  --  3.5  --  3.4*  --   --   --   --   --   --   CL 76*  --  84*  --  90*  --  96*  --   --   --   --   --   --   CO2 24  --  21*  --  21*  --  22  --   --   --   --   --   --   GLUCOSE 111*  --  97  --  95  --  92  --   --   --   --   --   --   BUN 15  --  13  --  8  --  6*  --   --   --   --   --   --   CREATININE 0.61  --  0.40*  --  0.46  --  0.51  --   --   --   --   --   --   CALCIUM 8.8*   < > 7.9*  --  7.9*  --  7.6*  --   --   --   --   --   --   MG  --   --   --   --  1.8  --  1.7  --   --   --   --   --   --    < > = values in this interval not displayed.    Liver Function  Tests: Recent Labs  Lab 11/05/19 0459 11/06/19 0523  AST 39 32  ALT 20 17  ALKPHOS 50 50  BILITOT 2.3* 1.7*  PROT 6.3* 5.8*  ALBUMIN 3.5 3.3*   No results for input(s): LIPASE, AMYLASE in the last 168 hours. No results for input(s): AMMONIA in the last 168 hours.  CBC: Recent Labs  Lab 11/03/19 1424 11/04/19 0414 11/05/19 0459 11/06/19 0523 11/08/19 1221  WBC 5.2 7.8 6.9 6.4  9.6  NEUTROABS  --   --  5.2 4.7  --   HGB 14.3 13.5 12.7 11.9* 12.2  HCT 38.1 36.0 34.3* 33.3* 35.2*  MCV 91.8 91.4 92.2 95.4 99.4  PLT 218 221 218 190 197    Cardiac Enzymes: No results for input(s): CKTOTAL, CKMB, CKMBINDEX, TROPONINI in the last 168 hours.  BNP: Invalid input(s): POCBNP  CBG: No results for input(s): GLUCAP in the last 168 hours.  Microbiology: Results for orders placed or performed during the hospital encounter of 11/03/19  SARS CORONAVIRUS 2 (TAT 6-24 HRS) Nasopharyngeal Nasopharyngeal Swab     Status: None   Collection Time: 11/03/19  3:20 PM   Specimen: Nasopharyngeal Swab  Result Value Ref Range Status   SARS Coronavirus 2 NEGATIVE NEGATIVE Final    Comment: (NOTE) SARS-CoV-2 target nucleic acids are NOT DETECTED. The SARS-CoV-2 RNA is generally detectable in upper and lower respiratory specimens during the acute phase of infection. Negative results do not preclude SARS-CoV-2 infection, do not rule out co-infections with other pathogens, and should not be used as the sole basis for treatment or other patient management decisions. Negative results must be combined with clinical observations, patient history, and epidemiological information. The expected result is Negative. Fact Sheet for Patients: HairSlick.no Fact Sheet for Healthcare Providers: quierodirigir.com This test is not yet approved or cleared by the [place] FDA and  has been authorized for detection and/or diagnosis of SARS-CoV-2 by FDA  under an Emergency Use Authorization (EUA). This EUA will remain  in effect (meaning this test can be used) for the duration of the COVID-19 declaration under Section 56 4(b)(1) of the Act, 21 U.S.C. section 360bbb-3(b)(1), unless the authorization is terminated or revoked sooner. Performed at [facility], [address] [place], [place] [address]   MRSA PCR Screening     Status: None   Collection Time: 11/03/19  9:00 PM   Specimen: Nasopharyngeal  Result Value Ref Range Status   MRSA by PCR NEGATIVE NEGATIVE Final    Comment:        The GeneXpert MRSA Assay (FDA approved for NASAL specimens only), is one component of a comprehensive MRSA colonization surveillance program. It is not intended to diagnose MRSA infection nor to guide or monitor treatment for MRSA infections. Performed at [facility], [address] [place], [place] [address]     Coagulation Studies: No results for input(s): LABPROT, INR in the last 72 hours.  Urinalysis: No results for input(s): COLORURINE, LABSPEC, PHURINE, GLUCOSEU, HGBUR, BILIRUBINUR, KETONESUR, PROTEINUR, UROBILINOGEN, NITRITE, LEUKOCYTESUR in the last 72 hours.  Invalid input(s): APPERANCEUR    Imaging: DG Chest Port 1 View  Result Date: 11/06/2019 CLINICAL DATA:  Shortness of breath EXAM: PORTABLE CHEST 1 VIEW COMPARISON:  09/18/2006 FINDINGS: Cardiac shadow is mildly enlarged. Aortic calcifications are seen. The lungs are well aerated bilaterally. Nipple shadows are noted. No acute bony abnormality is seen. IMPRESSION: No acute abnormality noted. Electronically Signed   By: Alcide Clever M.D.   On: 11/06/2019 19:00     Medications:    . apixaban  2.5 mg Oral BID  . cloNIDine  0.1 mg Oral BID  . feeding supplement (ENSURE ENLIVE)  237 mL Oral TID BM  . furosemide  20 mg Intravenous Once  . hydrALAZINE  50 mg Oral TID  . losartan  100 mg Oral Daily  . polyethylene glycol  17 g Oral Daily  . senna-docusate  1  tablet Oral BID  .  sodium chloride flush  3 mL Intravenous Once  . sodium chloride  2 g Oral TID WC   ondansetron (ZOFRAN) IV  Assessment/ Plan:  86 y.o. female with a PMHx of hypertension, atrial fibrillation, prior episode of hyponatremia, cystocele, osteoporosis who was admitted to [facility] on 11/03/2019 for evaluation of severe hyponatremia.  1.  Hyponatremia.  Suspect secondary to use of chlorthalidone.  Sodium 113 on admission.     - off IV saline infusion.  - salt tabs.   2.  Hypertension.  Blood pressure has been labile.  With irregular rhythm: atrial fibrillation Current regimen of clonidine, hydralazine, and losartan.   - IV furosemide 20mg  x 1    LOS: 5 Yvette [name] 3/14/202112:47 PM

## 2019-11-08 NOTE — Progress Notes (Signed)
[facility] Progress Note  Patient: Yvette [name]    ONG:[id number]  DOA: 11/03/2019     Date of Service: the patient was seen and examined on 11/08/2019  Chief Complaint  Patient presents with  . Dizziness   Brief hospital course: Montez Stryker is an 86 y.o. female with PMH significant for HTN, hyponatremia and atrial fibrillation on Eliquis who was well until this morning when she felt dizzy.  She said she had difficulty walking which is very unusual for her.  She lives by herself in senior housing. Patient was found to have hypovolemic hyponatremia. Currently further plan is to correct sodium under the guidance of nephrology.  Assessment and Plan: 1.  Hypovolemic hyponatremia Sodium on admission 113.  Currently trending to 120s. Nephrology consulted. Appreciate assistance. Suspect secondary to use of chlorthalidone. Treated with normal saline. Now only on salt tabs.  Given that the patient osmolarity is low and patient appears to be volume loaded with provide IV Lasix and monitor response.  Sodium actually trending down as well before Lasix.  2.  Essential hypertension.  Uncontrolled Blood pressure elevated. Patient currently on hydralazine 25 mg 3 times daily. Losartan added. Do not use thiazides in future. Patient is on clonidine. Hydralazine dose increased.  Imdur adde now on hold again. Monitor.  3.  Nausea, constipation resolved X-ray abdomen unremarkable for any acute abnormality. Continue bowel regimen  4.  History of chronic A. fib Currently rate controlled. Continue Eliquis 2.5 mg twice daily per home dose.  5.  Dizziness In the setting of hyponatremia. No focal deficit. Monitor. PT consulted.  6.  Poor appetite. Poor p.o. intake. Weight loss unintentional. Added Ensure. Holding off on Megace but can add that down the road. Ensure should help with solute load as well.  7.  Acute metabolic encephalopathy In the setting of hyponatremia. Monitor.  Diet:  Regular diet, fluid restriction to 1200 cc. DVT Prophylaxis: Therapeutic Anticoagulation with Eliquis   Advance goals of care discussion: Full code  Family Communication: no family was present at bedside, at the time of interview.  Discussed with daughter on the phone.  Disposition:  Pt is from ILF, admitted with dizziness due to hyponatremia, still has severe hyponatremia, which precludes a safe discharge. Discharge to ALF/SNF, when sodium corrected and PT consultation.  Subjective: Feeling somewhat fatigued and tired.  No nausea no vomiting.  No fever no chills.  Regular bowel movement.  Physical Exam: General:  alert oriented to time, place, and person.  Appear in mild distress, affect anxious Eyes: PERRL ENT: Oral Mucosa Clear, moist  Neck: no JVD,  Cardiovascular: S1 and S2 Present, no Murmur,  Respiratory: good respiratory effort, Bilateral Air entry equal and Decreased, faint basal crackles, no wheezes Abdomen: Bowel Sound present, Soft and no tenderness,  Skin: no rash Extremities: trace Pedal edema, no calf tenderness, mild edema of fingers bilaterally Neurologic: without any new focal findings Gait not checked due to patient safety concerns  Vitals:   11/07/19 2300 11/07/19 2354 11/08/19 0000 11/08/19 0100  BP:   (!) 116/40   Pulse: (!) 56  68 (!) 49  Resp: 20  20 19   Temp:  97.8 F (36.6 C)    TempSrc:  Oral    SpO2: 97%  98% 96%  Weight:      Height:        Intake/Output Summary (Last 24 hours) at 11/08/2019 1415 Last data filed at 11/08/2019 1359 Gross per 24 hour  Intake 360 ml  Output  750 ml  Net -390 ml   Filed Weights   11/03/19 1351 11/04/19 1615  Weight: 49.9 kg 45 kg    Data Reviewed: I have personally reviewed and interpreted daily labs, tele strips, imagings as discussed above. I reviewed all nursing notes, pharmacy notes, vitals, pertinent old records I have discussed plan of care as described above with RN and  patient/family.  CBC: Recent Labs  Lab 11/03/19 1424 11/04/19 0414 11/05/19 0459 11/06/19 0523 11/08/19 1221  WBC 5.2 7.8 6.9 6.4 9.6  NEUTROABS  --   --  5.2 4.7  --   HGB 14.3 13.5 12.7 11.9* 12.2  HCT 38.1 36.0 34.3* 33.3* 35.2*  MCV 91.8 91.4 92.2 95.4 99.4  PLT 218 221 218 190 341   Basic Metabolic Panel: Recent Labs  Lab 11/03/19 1424 11/03/19 1428 11/04/19 0414 11/04/19 0816 11/05/19 0459 11/05/19 0853 11/06/19 0523 11/06/19 0821 11/07/19 2003 11/07/19 2355 11/08/19 0426 11/08/19 0912 11/08/19 1221  NA 113*   < > 116*   < > 119*   < > 121*   < > 127* 125* 127* 122* 121*  K 4.2  --  3.7  --  3.5  --  3.4*  --   --   --   --   --  4.7  CL 76*  --  84*  --  90*  --  96*  --   --   --   --   --  92*  CO2 24  --  21*  --  21*  --  22  --   --   --   --   --  21*  GLUCOSE 111*  --  97  --  95  --  92  --   --   --   --   --  116*  BUN 15  --  13  --  8  --  6*  --   --   --   --   --  14  CREATININE 0.61  --  0.40*  --  0.46  --  0.51  --   --   --   --   --  0.41*  CALCIUM 8.8*  --  7.9*  --  7.9*  --  7.6*  --   --   --   --   --  8.2*  MG  --   --   --   --  1.8  --  1.7  --   --   --   --   --   --    < > = values in this interval not displayed.    Studies: No results found.  Scheduled Meds: . apixaban  2.5 mg Oral BID  . cloNIDine  0.1 mg Oral BID  . feeding supplement (ENSURE ENLIVE)  237 mL Oral TID BM  . hydrALAZINE  50 mg Oral TID  . losartan  100 mg Oral Daily  . polyethylene glycol  17 g Oral Daily  . senna-docusate  1 tablet Oral BID  . sodium chloride flush  3 mL Intravenous Once  . sodium chloride  2 g Oral TID WC   Continuous Infusions:  PRN Meds: ondansetron (ZOFRAN) IV  Time spent: 35 minutes  Author: Berle Mull, MD [facility] 11/08/2019 2:15 PM  To reach On-call, see care teams to locate the attending and reach out to them via [email or web address]. If 7PM-7AM, please contact night-coverage If you still have difficulty  reaching  the attending provider, please page the [facility] (Director on Call) for [facility] on amion for assistance.

## 2019-11-08 NOTE — Plan of Care (Signed)
Will continue to monitor plan of care and educate patient with each interaction

## 2019-11-09 LAB — SODIUM
Sodium: 121 mmol/L — ABNORMAL LOW (ref 135–145)
Sodium: 122 mmol/L — ABNORMAL LOW (ref 135–145)
Sodium: 122 mmol/L — ABNORMAL LOW (ref 135–145)
Sodium: 122 mmol/L — ABNORMAL LOW (ref 135–145)
Sodium: 126 mmol/L — ABNORMAL LOW (ref 135–145)

## 2019-11-09 LAB — CBC WITH DIFFERENTIAL/PLATELET
Abs Immature Granulocytes: 0.05 10*3/uL (ref 0.00–0.07)
Basophils Absolute: 0 10*3/uL (ref 0.0–0.1)
Basophils Relative: 0 %
Eosinophils Absolute: 0 10*3/uL (ref 0.0–0.5)
Eosinophils Relative: 0 %
HCT: 31.3 % — ABNORMAL LOW (ref 36.0–46.0)
Hemoglobin: 11.1 g/dL — ABNORMAL LOW (ref 12.0–15.0)
Immature Granulocytes: 1 %
Lymphocytes Relative: 9 %
Lymphs Abs: 0.8 10*3/uL (ref 0.7–4.0)
MCH: 34.3 pg — ABNORMAL HIGH (ref 26.0–34.0)
MCHC: 35.5 g/dL (ref 30.0–36.0)
MCV: 96.6 fL (ref 80.0–100.0)
Monocytes Absolute: 1 10*3/uL (ref 0.1–1.0)
Monocytes Relative: 11 %
Neutro Abs: 7 10*3/uL (ref 1.7–7.7)
Neutrophils Relative %: 79 %
Platelets: 170 10*3/uL (ref 150–400)
RBC: 3.24 MIL/uL — ABNORMAL LOW (ref 3.87–5.11)
RDW: 12.5 % (ref 11.5–15.5)
WBC: 8.9 10*3/uL (ref 4.0–10.5)
nRBC: 0 % (ref 0.0–0.2)

## 2019-11-09 LAB — COMPREHENSIVE METABOLIC PANEL
ALT: 17 U/L (ref 0–44)
AST: 26 U/L (ref 15–41)
Albumin: 3 g/dL — ABNORMAL LOW (ref 3.5–5.0)
Alkaline Phosphatase: 50 U/L (ref 38–126)
Anion gap: 10 (ref 5–15)
BUN: 17 mg/dL (ref 8–23)
CO2: 22 mmol/L (ref 22–32)
Calcium: 7.8 mg/dL — ABNORMAL LOW (ref 8.9–10.3)
Chloride: 91 mmol/L — ABNORMAL LOW (ref 98–111)
Creatinine, Ser: 0.5 mg/dL (ref 0.44–1.00)
GFR calc Af Amer: >60 mL/min (ref >60)
GFR calc non Af Amer: >60 mL/min (ref >60)
Glucose, Bld: 108 mg/dL — ABNORMAL HIGH (ref 70–99)
Potassium: 3.6 mmol/L (ref 3.5–5.1)
Sodium: 123 mmol/L — ABNORMAL LOW (ref 135–145)
Total Bilirubin: 1.2 mg/dL (ref 0.3–1.2)
Total Protein: 5.7 g/dL — ABNORMAL LOW (ref 6.5–8.1)

## 2019-11-09 MED ORDER — FUROSEMIDE 10 MG/ML IJ SOLN
20.0000 mg | Freq: Every day | INTRAMUSCULAR | Status: DC
  Administered 2019-11-09 – 2019-11-12 (×4): 20 mg via INTRAVENOUS
  Filled 2019-11-09 (×4): qty 2

## 2019-11-09 NOTE — Care Management Important Message (Signed)
Important Message  Patient Details  Name: Yvette [name] MRN: [id number] Date of Birth: 11-23-1923   Medicare Important Message Given:  Yes     Olegario Messier A Nyema Hachey 11/09/2019, 12:06 PM

## 2019-11-09 NOTE — Progress Notes (Signed)
[facility]  ROUNDING NOTE   Subjective:   Na 122 Daughter at bedside. Furosemide yesterday. UOP 1788mL.   Objective:  Vital signs in last 24 hours:  Temp:  [97.6 F (36.4 C)-99 F (37.2 C)] 97.8 F (36.6 C) (03/15 0900) Pulse Rate:  [68-107] 68 (03/15 0000) Resp:  [20-25] 20 (03/15 0000) BP: (126-172)/(46-70) 142/63 (03/15 0900) SpO2:  [95 %-99 %] 97 % (03/15 0000)  Weight change:  Filed Weights   11/03/19 1351 11/04/19 1615  Weight: 49.9 kg 45 kg    Intake/Output: I/O last 3 completed shifts: In: 480 [P.O.:480] Out: 1750 [Urine:1750]   Intake/Output this shift:  Total I/O In: 240 [P.O.:240] Out: -   Physical Exam: General: No acute distress, sitting in chair  Head: Normocephalic, atraumatic. Moist oral mucosal membranes  Eyes: Anicteric  Neck: Supple, trachea midline  Lungs:  Clear to auscultation, normal effort  Heart: irregular  Abdomen:  Soft, nontender, bowel sounds present  Extremities: + edema  Neurologic: Awake, alert, following commands  Skin: No lesions        Basic Metabolic Panel: Recent Labs  Lab 11/04/19 0414 11/04/19 0816 11/05/19 0459 11/05/19 0853 11/06/19 0523 11/06/19 0821 11/08/19 1221 11/08/19 1221 11/08/19 1548 11/08/19 2010 11/08/19 2356 11/09/19 0406 11/09/19 0835  NA 116*   < > 119*   < > 121*   < > 121*   < > 122* 121* 122* 123* 122*  K 3.7  --  3.5  --  3.4*  --  4.7  --   --   --   --  3.6  --   CL 84*  --  90*  --  96*  --  92*  --   --   --   --  91*  --   CO2 21*  --  21*  --  22  --  21*  --   --   --   --  22  --   GLUCOSE 97  --  95  --  92  --  116*  --   --   --   --  108*  --   BUN 13  --  8  --  6*  --  14  --   --   --   --  17  --   CREATININE 0.40*  --  0.46  --  0.51  --  0.41*  --   --   --   --  0.50  --   CALCIUM 7.9*   < > 7.9*   < > 7.6*  --  8.2*  --   --   --   --  7.8*  --   MG  --   --  1.8  --  1.7  --   --   --   --   --   --   --   --    < > = values in this interval not  displayed.    Liver Function Tests: Recent Labs  Lab 11/05/19 0459 11/06/19 0523 11/08/19 1221 11/09/19 0406  AST 39 32 32 26  ALT 20 17 20 17   ALKPHOS 50 50 61 50  BILITOT 2.3* 1.7* 1.6* 1.2  PROT 6.3* 5.8* 6.5 5.7*  ALBUMIN 3.5 3.3* 3.7 3.0*   No results for input(s): LIPASE, AMYLASE in the last 168 hours. No results for input(s): AMMONIA in the last 168 hours.  CBC: Recent Labs  Lab 11/04/19 0414 11/05/19  0626 11/06/19 0523 11/08/19 1221 11/09/19 0406  WBC 7.8 6.9 6.4 9.6 8.9  NEUTROABS  --  5.2 4.7  --  7.0  HGB 13.5 12.7 11.9* 12.2 11.1*  HCT 36.0 34.3* 33.3* 35.2* 31.3*  MCV 91.4 92.2 95.4 99.4 96.6  PLT 221 218 190 197 170    Cardiac Enzymes: No results for input(s): CKTOTAL, CKMB, CKMBINDEX, TROPONINI in the last 168 hours.  BNP: Invalid input(s): POCBNP  CBG: No results for input(s): GLUCAP in the last 168 hours.  Microbiology: Results for orders placed or performed during the hospital encounter of 11/03/19  SARS CORONAVIRUS 2 (TAT 6-24 HRS) Nasopharyngeal Nasopharyngeal Swab     Status: None   Collection Time: 11/03/19  3:20 PM   Specimen: Nasopharyngeal Swab  Result Value Ref Range Status   SARS Coronavirus 2 NEGATIVE NEGATIVE Final    Comment: (NOTE) SARS-CoV-2 target nucleic acids are NOT DETECTED. The SARS-CoV-2 RNA is generally detectable in upper and lower respiratory specimens during the acute phase of infection. Negative results do not preclude SARS-CoV-2 infection, do not rule out co-infections with other pathogens, and should not be used as the sole basis for treatment or other patient management decisions. Negative results must be combined with clinical observations, patient history, and epidemiological information. The expected result is Negative. Fact Sheet for Patients: HairSlick.no Fact Sheet for Healthcare Providers: quierodirigir.com This test is not yet approved or  cleared by the [place] FDA and  has been authorized for detection and/or diagnosis of SARS-CoV-2 by FDA under an Emergency Use Authorization (EUA). This EUA will remain  in effect (meaning this test can be used) for the duration of the COVID-19 declaration under Section 56 4(b)(1) of the Act, 21 U.S.C. section 360bbb-3(b)(1), unless the authorization is terminated or revoked sooner. Performed at [facility], [address], [place], [place] [address]   MRSA PCR Screening     Status: None   Collection Time: 11/03/19  9:00 PM   Specimen: Nasopharyngeal  Result Value Ref Range Status   MRSA by PCR NEGATIVE NEGATIVE Final    Comment:        The GeneXpert MRSA Assay (FDA approved for NASAL specimens only), is one component of a comprehensive MRSA colonization surveillance program. It is not intended to diagnose MRSA infection nor to guide or monitor treatment for MRSA infections. Performed at [facility], [address] [place], [place] [address]     Coagulation Studies: No results for input(s): LABPROT, INR in the last 72 hours.  Urinalysis: No results for input(s): COLORURINE, LABSPEC, PHURINE, GLUCOSEU, HGBUR, BILIRUBINUR, KETONESUR, PROTEINUR, UROBILINOGEN, NITRITE, LEUKOCYTESUR in the last 72 hours.  Invalid input(s): APPERANCEUR    Imaging: No results found.   Medications:    . apixaban  2.5 mg Oral BID  . cloNIDine  0.1 mg Oral BID  . feeding supplement  1 Container Oral TID BM  . feeding supplement (PRO-STAT SUGAR FREE 64)  30 mL Oral BID  . furosemide  20 mg Intravenous Daily  . hydrALAZINE  50 mg Oral TID  . losartan  100 mg Oral Daily  . polyethylene glycol  17 g Oral Daily  . senna-docusate  1 tablet Oral BID  . sodium chloride flush  3 mL Intravenous Once  . sodium chloride  2 g Oral TID WC   ondansetron (ZOFRAN) IV  Assessment/ Plan:  86 y.o. female with a PMHx of hypertension, atrial fibrillation, prior episode of  hyponatremia, cystocele, osteoporosis who was  admitted to [facility] on 11/03/2019 for evaluation of severe hyponatremia.  1.  Hyponatremia.  Suspect secondary to use of chlorthalidone.  Sodium 113 on admission.     - off IV saline infusion.  - Discontinue salt tabs.   2.  Hypertension.  Blood pressure has been labile.  With irregular rhythm: atrial fibrillation Current regimen of clonidine, hydralazine, and losartan.   - IV furosemide 20mg       LOS: 6 Zyir Gassert 3/15/202112:09 PM

## 2019-11-09 NOTE — Progress Notes (Signed)
Physical Therapy Treatment Patient Details Name: Yvette [name] MRN: [id number] DOB: [birth year] Today's Date: 11/09/2019    History of Present Illness Patient admitted due to weakness, dizziness. Found to have low sodium. PMH includes OA, HTN    PT Comments    Pt excited for therapy.  Ready for session.  OOB with increased time and supervision.  Stood with verbal cues for hand placements and is able to walk 160' x 2 with RW and min guard/supervision.  No LOB or buckling noted.  Generally steady with overall improved HR response with activity.  100-110's with mobility only occasionally increasing to high 120'/low 130's but quickly returning.  Pt stated she feels comfortable with mobility and comfortable with discharge home.  Will update discharge recommendations given improved mobility.  Of note.  Pt c/o painful toes.  Both feet checked and toes red. Relayed to nursing staff.  Pt encouraged to keep blankets off of her feet at night or tent them on end of bed.  Voiced understanding.   Follow Up Recommendations  Home health PT;Supervision for mobility/OOB     Equipment Recommendations  Rolling walker with 5" wheels    Recommendations for Other Services       Precautions / Restrictions Precautions Precautions: Fall Restrictions Weight Bearing Restrictions: No    Mobility  Bed Mobility Overal bed mobility: Needs Assistance Bed Mobility: Supine to Sit     Supine to sit: Supervision        Transfers Overall transfer level: Needs assistance Equipment used: Rolling walker (2 wheeled) Transfers: Sit to/from Stand Sit to Stand: Supervision         General transfer comment: verbal cues for hand placements  Ambulation/Gait Ambulation/Gait assistance: Supervision;Min guard Gait Distance (Feet): 160 Feet Assistive device: Rolling walker (2 wheeled) Gait Pattern/deviations: Step-through pattern;Decreased step length - right;Decreased step length - left Gait velocity:  decreased   General Gait Details: 160' x 2 with seated rest break encouraged by Probation officer.  HR generally stable today in 100-110's with activity.  up to 130 briefly at times but quickly returns.   Stairs             Wheelchair Mobility    Modified Rankin (Stroke Patients Only)       Balance Overall balance assessment: Needs assistance Sitting-balance support: Feet supported Sitting balance-Leahy Scale: Good     Standing balance support: Bilateral upper extremity supported Standing balance-Leahy Scale: Fair                              Cognition Arousal/Alertness: Awake/alert Behavior During Therapy: WFL for tasks assessed/performed Overall Cognitive Status: Within Functional Limits for tasks assessed                                        Exercises      General Comments        Pertinent Vitals/Pain Pain Assessment: No/denies pain    Home Living                      Prior Function            PT Goals (current goals can now be found in the care plan section) Progress towards PT goals: Progressing toward goals    Frequency    Min 2X/week      PT Plan Discharge plan  needs to be updated    Co-evaluation              AM-PAC PT "6 Clicks" Mobility   Outcome Measure  Help needed turning from your back to your side while in a flat bed without using bedrails?: None Help needed moving from lying on your back to sitting on the side of a flat bed without using bedrails?: None Help needed moving to and from a bed to a chair (including a wheelchair)?: A Little Help needed standing up from a chair using your arms (e.g., wheelchair or bedside chair)?: A Little Help needed to walk in hospital room?: A Little Help needed climbing 3-5 steps with a railing? : A Little 6 Click Score: 20    End of Session Equipment Utilized During Treatment: Gait belt Activity Tolerance: Patient tolerated treatment well Patient left:  in chair;with call bell/phone within reach;with chair alarm set Nurse Communication: Mobility status;Other (comment)(toes red)       Time: 8413-2440 PT Time Calculation (min) (ACUTE ONLY): 25 min  Charges:  $Gait Training: 23-37 mins                    .Danielle Dess, PTA 11/09/19, 9:34 AM

## 2019-11-09 NOTE — TOC Progression Note (Signed)
Transition of Care [facility]) - Progression Note    Patient Details  Name: Solace Manwarren MRN: [id number] Date of Birth: [birth year]  Transition of [facility]) CM/SW Contact  Allayne Butcher, RN Phone Number: 11/09/2019, 3:34 PM  Clinical Narrative:    Patient chooses [facility] for short term rehab.  Patient is not yet medically stable for discharge sodium levels remain low.     Expected Discharge Plan: Skilled Nursing Facility Barriers to Discharge: Continued Medical Work up  Expected Discharge Plan and Services Expected Discharge Plan: Skilled Nursing Facility   Discharge Planning Services: CM Consult Post Acute Care Choice: Skilled Nursing Facility Living arrangements for the past 2 months: Apartment, Assisted Living Facility                                       Social Determinants of Health (SDOH) Interventions    Readmission Risk Interventions No flowsheet data found.

## 2019-11-09 NOTE — Progress Notes (Signed)
[facility] Progress Note  Patient: Yvette [name]    EXH:[id number]  DOA: 11/03/2019     Date of Service: the patient was seen and examined on 11/09/2019  Chief Complaint  Patient presents with  . Dizziness   Brief hospital course: Yvette [name] is an 86 y.o. female with PMH significant for HTN, hyponatremia and atrial fibrillation on Eliquis who was well until this morning when she felt dizzy.  She said she had difficulty walking which is very unusual for her.  She lives by herself in senior housing. Patient was found to have hypovolemic hyponatremia. Currently further plan is to correct sodium under the guidance of nephrology.  Assessment and Plan: 1.  Hypovolemic hyponatremia Sodium on admission 113.  Currently trending to 120s. Nephrology consulted. Appreciate assistance. Suspect secondary to use of chlorthalidone. Treated with normal saline. Now only on salt tabs.  Given that the patient osmolarity is low and patient appears to be volume loaded with provide IV Lasix and monitor response.   Patient had a water pitcher in the room and daughter reports that the patient drinks a lot of water.  Informed RN to maintain fluid restriction.  2.  Essential hypertension.  Uncontrolled Blood pressure elevated. Patient currently on hydralazine 25 mg 3 times daily. Losartan added. Do not use thiazides in future. Patient is on clonidine. Hydralazine dose increased.  Imdur adde now on hold again. Monitor.  3.  Nausea, constipation resolved X-ray abdomen unremarkable for any acute abnormality. Continue bowel regimen  4.  History of chronic A. fib Currently rate controlled. Continue Eliquis 2.5 mg twice daily per home dose.  5.  Dizziness In the setting of hyponatremia. No focal deficit. Monitor. PT consulted.  6.  Poor appetite. Poor p.o. intake. Weight loss unintentional. Added Ensure. Holding off on Megace but can add that down the road. Ensure should help with solute load as  well.  7.  Acute metabolic encephalopathy In the setting of hyponatremia. Monitor.  Diet: Regular diet, fluid restriction to 1200 cc. DVT Prophylaxis: Therapeutic Anticoagulation with Eliquis   Advance goals of care discussion: Full code  Family Communication: family was present at bedside, at the time of interview.   Disposition:  Pt is from ILF, admitted with dizziness due to hyponatremia, still has severe hyponatremia, which precludes a safe discharge. Discharge to ALF/SNF, when sodium corrected and PT consultation.  Subjective: No acute complaint no nausea no vomiting no fever no chills.  No chest pain no complaint.  Physical Exam: General:  alert oriented to time, place, and person.  Appear in mild distress, affect anxious Eyes: PERRL ENT: Oral Mucosa Clear, moist  Neck: no JVD,  Cardiovascular: S1 and S2 Present, no Murmur,  Respiratory: good respiratory effort, Bilateral Air entry equal and Decreased, faint basal crackles, no wheezes Abdomen: Bowel Sound present, Soft and no tenderness,  Skin: no rash Extremities: trace Pedal edema, no calf tenderness, mild edema of fingers bilaterally Neurologic: without any new focal findings Gait not checked due to patient safety concerns  Vitals:   11/08/19 2300 11/09/19 0000 11/09/19 0900 11/09/19 1636  BP:  (!) 126/46 (!) 142/63   Pulse: 85 68    Resp: (!) 23 20    Temp:  98.6 F (37 C) 97.8 F (36.6 C) 99.1 F (37.3 C)  TempSrc:  Oral Oral Oral  SpO2: 97% 97%    Weight:      Height:        Intake/Output Summary (Last 24 hours) at 11/09/2019 1953  Last data filed at 11/09/2019 1910 Gross per 24 hour  Intake 480 ml  Output 700 ml  Net -220 ml   Filed Weights   11/03/19 1351 11/04/19 1615  Weight: 49.9 kg 45 kg    Data Reviewed: I have personally reviewed and interpreted daily labs, tele strips, imagings as discussed above. I reviewed all nursing notes, pharmacy notes, vitals, pertinent old records I have  discussed plan of care as described above with RN and patient/family.  CBC: Recent Labs  Lab 11/04/19 0414 11/05/19 0459 11/06/19 0523 11/08/19 1221 11/09/19 0406  WBC 7.8 6.9 6.4 9.6 8.9  NEUTROABS  --  5.2 4.7  --  7.0  HGB 13.5 12.7 11.9* 12.2 11.1*  HCT 36.0 34.3* 33.3* 35.2* 31.3*  MCV 91.4 92.2 95.4 99.4 96.6  PLT 221 218 190 197 170   Basic Metabolic Panel: Recent Labs  Lab 11/04/19 0414 11/04/19 0816 11/05/19 0459 11/05/19 0853 11/06/19 0523 11/06/19 0821 11/08/19 1221 11/08/19 1548 11/08/19 2356 11/09/19 0406 11/09/19 0835 11/09/19 1158 11/09/19 1613  NA 116*   < > 119*   < > 121*   < > 121*   < > 122* 123* 122* 122* 121*  K 3.7  --  3.5  --  3.4*  --  4.7  --   --  3.6  --   --   --   CL 84*  --  90*  --  96*  --  92*  --   --  91*  --   --   --   CO2 21*  --  21*  --  22  --  21*  --   --  22  --   --   --   GLUCOSE 97  --  95  --  92  --  116*  --   --  108*  --   --   --   BUN 13  --  8  --  6*  --  14  --   --  17  --   --   --   CREATININE 0.40*  --  0.46  --  0.51  --  0.41*  --   --  0.50  --   --   --   CALCIUM 7.9*  --  7.9*  --  7.6*  --  8.2*  --   --  7.8*  --   --   --   MG  --   --  1.8  --  1.7  --   --   --   --   --   --   --   --    < > = values in this interval not displayed.    Studies: No results found.  Scheduled Meds: . apixaban  2.5 mg Oral BID  . cloNIDine  0.1 mg Oral BID  . feeding supplement  1 Container Oral TID BM  . feeding supplement (PRO-STAT SUGAR FREE 64)  30 mL Oral BID  . furosemide  20 mg Intravenous Daily  . hydrALAZINE  50 mg Oral TID  . losartan  100 mg Oral Daily  . polyethylene glycol  17 g Oral Daily  . senna-docusate  1 tablet Oral BID  . sodium chloride flush  3 mL Intravenous Once   Continuous Infusions:  PRN Meds: ondansetron (ZOFRAN) IV  Time spent: 35 minutes  Author: Lynden Oxford, MD [facility] 11/09/2019 7:53 PM  To reach On-call, see  care teams to locate the attending and reach  out to them via [email or web address]. If 7PM-7AM, please contact night-coverage If you still have difficulty reaching the attending provider, please page the [facility] (Director on Call) for [facility] on amion for assistance.

## 2019-11-10 LAB — SODIUM
Sodium: 123 mmol/L — ABNORMAL LOW (ref 135–145)
Sodium: 124 mmol/L — ABNORMAL LOW (ref 135–145)
Sodium: 124 mmol/L — ABNORMAL LOW (ref 135–145)
Sodium: 124 mmol/L — ABNORMAL LOW (ref 135–145)
Sodium: 125 mmol/L — ABNORMAL LOW (ref 135–145)

## 2019-11-10 LAB — CBC WITH DIFFERENTIAL/PLATELET
Abs Immature Granulocytes: 0.05 10*3/uL (ref 0.00–0.07)
Basophils Absolute: 0 10*3/uL (ref 0.0–0.1)
Basophils Relative: 1 %
Eosinophils Absolute: 0 10*3/uL (ref 0.0–0.5)
Eosinophils Relative: 1 %
HCT: 31.7 % — ABNORMAL LOW (ref 36.0–46.0)
Hemoglobin: 11.9 g/dL — ABNORMAL LOW (ref 12.0–15.0)
Immature Granulocytes: 1 %
Lymphocytes Relative: 11 %
Lymphs Abs: 0.7 10*3/uL (ref 0.7–4.0)
MCH: 36.1 pg — ABNORMAL HIGH (ref 26.0–34.0)
MCHC: 37.5 g/dL — ABNORMAL HIGH (ref 30.0–36.0)
MCV: 96.1 fL (ref 80.0–100.0)
Monocytes Absolute: 0.8 10*3/uL (ref 0.1–1.0)
Monocytes Relative: 13 %
Neutro Abs: 4.8 10*3/uL (ref 1.7–7.7)
Neutrophils Relative %: 73 %
Platelets: 176 10*3/uL (ref 150–400)
RBC: 3.3 MIL/uL — ABNORMAL LOW (ref 3.87–5.11)
RDW: 12.3 % (ref 11.5–15.5)
WBC: 6.4 10*3/uL (ref 4.0–10.5)
nRBC: 0 % (ref 0.0–0.2)

## 2019-11-10 LAB — COMPREHENSIVE METABOLIC PANEL
ALT: 17 U/L (ref 0–44)
AST: 23 U/L (ref 15–41)
Albumin: 3 g/dL — ABNORMAL LOW (ref 3.5–5.0)
Alkaline Phosphatase: 49 U/L (ref 38–126)
Anion gap: 8 (ref 5–15)
BUN: 18 mg/dL (ref 8–23)
CO2: 24 mmol/L (ref 22–32)
Calcium: 7.9 mg/dL — ABNORMAL LOW (ref 8.9–10.3)
Chloride: 94 mmol/L — ABNORMAL LOW (ref 98–111)
Creatinine, Ser: 0.38 mg/dL — ABNORMAL LOW (ref 0.44–1.00)
GFR calc Af Amer: >60 mL/min (ref >60)
GFR calc non Af Amer: >60 mL/min (ref >60)
Glucose, Bld: 104 mg/dL — ABNORMAL HIGH (ref 70–99)
Potassium: 3.3 mmol/L — ABNORMAL LOW (ref 3.5–5.1)
Sodium: 126 mmol/L — ABNORMAL LOW (ref 135–145)
Total Bilirubin: 1.3 mg/dL — ABNORMAL HIGH (ref 0.3–1.2)
Total Protein: 5.7 g/dL — ABNORMAL LOW (ref 6.5–8.1)

## 2019-11-10 MED ORDER — ISOSORBIDE MONONITRATE ER 30 MG PO TB24
30.0000 mg | ORAL_TABLET | Freq: Every day | ORAL | Status: DC
  Administered 2019-11-10 – 2019-11-11 (×2): 30 mg via ORAL
  Filled 2019-11-10 (×2): qty 1

## 2019-11-10 MED ORDER — POTASSIUM CHLORIDE CRYS ER 20 MEQ PO TBCR
40.0000 meq | EXTENDED_RELEASE_TABLET | Freq: Once | ORAL | Status: AC
  Administered 2019-11-10: 40 meq via ORAL
  Filled 2019-11-10: qty 2

## 2019-11-10 MED ORDER — POTASSIUM CHLORIDE CRYS ER 20 MEQ PO TBCR
20.0000 meq | EXTENDED_RELEASE_TABLET | Freq: Every day | ORAL | Status: DC
  Administered 2019-11-11 – 2019-11-13 (×3): 20 meq via ORAL
  Filled 2019-11-10 (×3): qty 1

## 2019-11-10 MED ORDER — ACETAMINOPHEN 325 MG PO TABS: 650.0000 mg | ORAL_TABLET | Freq: Four times a day (QID) | ORAL | Status: DC | PRN

## 2019-11-10 NOTE — Progress Notes (Signed)
Physical Therapy Treatment Patient Details Name: Yvette [name] MRN: [id number] DOB: [birth year] Today's Date: 11/10/2019    History of Present Illness 86 y/o female admitted due to weakness, dizziness. Found to have low sodium. PMH includes OA, HTN    PT Comments    Pt did well with ambulation safety, confidence, cadence, etc but had issues with tachycardia with regular flurries into the 130s and even 140s t/o the effort.  She did not report any chest pain or overt fatigue with questioning but ultimately did well from a PT perspective with ~200 ft of ambulation.  Pt overall showed good strength with seated LE exercises (apart from needing extra cuing secondary to [facility]).     Follow Up Recommendations  Home health PT;Supervision for mobility/OOB     Equipment Recommendations  Rolling walker with 5" wheels    Recommendations for Other Services       Precautions / Restrictions Precautions Precautions: Fall Restrictions Weight Bearing Restrictions: No    Mobility  Bed Mobility               General bed mobility comments: Pt in recliner on arrival, returned to recliner  Transfers Overall transfer level: Needs assistance Equipment used: Rolling walker (2 wheeled) Transfers: Sit to/from Stand Sit to Stand: Supervision         General transfer comment: verbal cues for hand placements  Ambulation/Gait Ambulation/Gait assistance: Supervision;Min guard Gait Distance (Feet): 200 Feet Assistive device: Rolling walker (2 wheeled)       General Gait Details: Pt again with a-fib tachy response to activity.  She generally stayed in the 120s t/o most of the effort but had brief spikes into the 130s (and rarely 140s) not infrequently.  Gait, however, was confident and safe and pt did not c/o subjective fatigue.   Stairs             Wheelchair Mobility    Modified Rankin (Stroke Patients Only)       Balance Overall balance assessment: Needs  assistance Sitting-balance support: Feet supported Sitting balance-Leahy Scale: Good     Standing balance support: Bilateral upper extremity supported Standing balance-Leahy Scale: Good Standing balance comment: reliant on UE support for balance at this time.                            Cognition Arousal/Alertness: Awake/alert Behavior During Therapy: WFL for tasks assessed/performed Overall Cognitive Status: Within Functional Limits for tasks assessed                                        Exercises General Exercises - Lower Extremity Ankle Circles/Pumps: AROM;10 reps Long Arc Quad: Strengthening;10 reps Heel Slides: Strengthening;10 reps Hip ABduction/ADduction: Strengthening;10 reps Hip Flexion/Marching: Strengthening;10 reps;Seated    General Comments        Pertinent Vitals/Pain Pain Assessment: No/denies pain    Home Living                      Prior Function            PT Goals (current goals can now be found in the care plan section) Progress towards PT goals: Progressing toward goals    Frequency    Min 2X/week      PT Plan Current plan remains appropriate    Co-evaluation  AM-PAC PT "6 Clicks" Mobility   Outcome Measure  Help needed turning from your back to your side while in a flat bed without using bedrails?: None Help needed moving from lying on your back to sitting on the side of a flat bed without using bedrails?: None Help needed moving to and from a bed to a chair (including a wheelchair)?: A Little Help needed standing up from a chair using your arms (e.g., wheelchair or bedside chair)?: A Little Help needed to walk in hospital room?: A Little Help needed climbing 3-5 steps with a railing? : A Little 6 Click Score: 20    End of Session Equipment Utilized During Treatment: Gait belt Activity Tolerance: Patient tolerated treatment well Patient left: in chair;with call bell/phone  within reach;with chair alarm set Nurse Communication: Mobility status;Other (comment) PT Visit Diagnosis: Unsteadiness on feet (R26.81);Muscle weakness (generalized) (M62.81);Difficulty in walking, not elsewhere classified (R26.2)     Time: 2440-1027 PT Time Calculation (min) (ACUTE ONLY): 25 min  Charges:  $Gait Training: 8-22 mins $Therapeutic Exercise: 8-22 mins                     Kreg Shropshire, DPT 11/10/2019, 4:39 PM

## 2019-11-10 NOTE — Progress Notes (Signed)
[facility] Progress Note  Patient: Yvette [name]    WUJ:[id number]  DOA: 11/03/2019     Date of Service: the patient was seen and examined on 11/10/2019  Chief Complaint  Patient presents with  . Dizziness   Brief hospital course: Yvette Hudsonis an 86 y.o.femalewith PMH significant for HTN,hyponatremia and atrial fibrillation on Eliquis who was well until this morning when she felt dizzy. She said she had difficulty walking which is very unusual for her. She lives by herself in senior housing. Patient was found to have hypovolemic hyponatremia.  Treated with IV fluids.  With sodium correction patient eventually developed hypervolemia now requiring Lasix. Currently further plan is continue sodium correction under the guidance of nephrology.  Assessment and Plan: 1.  Hyponatremia.  Multifactorial. Initially hypovolemic.  Now hypervolemic.  Initial presentation was thought to be secondary to chlorthalidone. Nephrology was consulted on admission, highly appreciate their assistance. Treated with IV normal saline with improvement in sodium. Later on due to hypervolemia IV fluids were on hold which led to worsening of sodium and now requiring IV Lasix with improvement in sodium. Also on sodium tablets. Continue fluid restriction.  2.  Accelerated hypertension Home blood pressure medication include chlorthalidone 25 mg daily and clonidine 0.1 mg daily as well as losartan 100 mg daily. Currently clonidine is 0.1 mg twice daily. Patient is on Lasix IV 20 mg daily. Hydralazine 50 mg 3 times daily, Imdur 30 mg daily as well as losartan 100 mg daily. Blood pressure still elevated but we will continue to monitor on current doses for today before making further adjustment.  3.  Constipation Nausea resolved Continue bowel regimen. X-ray abdomen unremarkable for any acute abnormality.  4.  Chronic A. fib Currently rate controlled. On Eliquis 2.5 mg twice daily which we will continue. At  home patient is not on any rate control medication due to her bradycardia but currently tolerating clonidine 0.1 mg twice daily.  5.  Acute metabolic encephalopathy. Dizziness. In the setting of hyponatremia. Symptoms currently resolved.  6.  Hypokalemia. Replaced. Monitor.  7.  Poor appetite Body mass index is 20.04 kg/m.  Continue boost and prostat. Hoping that increase protein intake will also improve hyponatremia.  8.  Generalized fatigue and deconditioning In the setting of hyponatremia and poor p.o. intake. PT initially recommended SNF, now with improvement recommends home with home health. Monitor.  Diet: Regular diet for fluid restrictions of 1200 cc DVT Prophylaxis: Subcutaneous Lovenox   Advance goals of care discussion: Full code  Family Communication: no family was present at bedside, at the time of interview.  Discussed with daughter 11/09/2019.   Disposition:  Pt is from ILF, admitted with dizziness due to hyponatremia, still has severe hyponatremia, which precludes a safe discharge. Discharge to ALF with home health, when sodium is corrected.  Subjective: Feeling fatigue but no acute complaint.  Reports persistent constipation.  No nausea no vomiting.  Passing gas.  Oral intake improving.  Physical Exam: General:  alert oriented to time, place, and person.  Appear in mild distress, affect appropriate Eyes: PERRL ENT: Oral Mucosa Clear, moist  Neck: no JVD,  Cardiovascular: S1 and S2 Present, no Murmur,  Respiratory: good respiratory effort, Bilateral Air entry equal and Decreased, no Crackles, no wheezes Abdomen: Bowel Sound present, Soft and no tenderness,  Skin: no rash Extremities: no Pedal edema, no calf tenderness Neurologic: without any new focal findings Gait not checked due to patient safety concerns  Vitals:   11/09/19 1636 11/09/19 2303  11/10/19 0814 11/10/19 1600  BP:  (!) 144/53 (!) 163/111 (!) 166/73  Pulse:  80 88 69  Resp:  16 20 19    Temp: 99.1 F (37.3 C) 98 F (36.7 C)  98.2 F (36.8 C)  TempSrc: Oral Oral  Oral  SpO2:  95% 92% 98%  Weight:      Height:        Intake/Output Summary (Last 24 hours) at 11/10/2019 1740 Last data filed at 11/10/2019 1300 Gross per 24 hour  Intake 720 ml  Output 900 ml  Net -180 ml   Filed Weights   11/03/19 1351 11/04/19 1615  Weight: 49.9 kg 45 kg    Data Reviewed: I have personally reviewed and interpreted daily labs, tele strips, imagings as discussed above. I reviewed all nursing notes, pharmacy notes, vitals, pertinent old records I have discussed plan of care as described above with RN and patient/family.  CBC: Recent Labs  Lab 11/05/19 0459 11/06/19 0523 11/08/19 1221 11/09/19 0406 11/10/19 0515  WBC 6.9 6.4 9.6 8.9 6.4  NEUTROABS 5.2 4.7  --  7.0 4.8  HGB 12.7 11.9* 12.2 11.1* 11.9*  HCT 34.3* 33.3* 35.2* 31.3* 31.7*  MCV 92.2 95.4 99.4 96.6 96.1  PLT 218 190 197 170 176   Basic Metabolic Panel: Recent Labs  Lab 11/05/19 0459 11/05/19 0853 11/06/19 0523 11/06/19 0821 11/08/19 1221 11/08/19 1548 11/09/19 0406 11/09/19 0835 11/10/19 0007 11/10/19 0515 11/10/19 0743 11/10/19 1217 11/10/19 1542  NA 119*   < > 121*   < > 121*   < > 123*   < > 124* 126* 125* 124* 124*  K 3.5  --  3.4*  --  4.7  --  3.6  --   --  3.3*  --   --   --   CL 90*  --  96*  --  92*  --  91*  --   --  94*  --   --   --   CO2 21*  --  22  --  21*  --  22  --   --  24  --   --   --   GLUCOSE 95  --  92  --  116*  --  108*  --   --  104*  --   --   --   BUN 8  --  6*  --  14  --  17  --   --  18  --   --   --   CREATININE 0.46  --  0.51  --  0.41*  --  0.50  --   --  0.38*  --   --   --   CALCIUM 7.9*  --  7.6*  --  8.2*  --  7.8*  --   --  7.9*  --   --   --   MG 1.8  --  1.7  --   --   --   --   --   --   --   --   --   --    < > = values in this interval not displayed.    Studies: No results found.  Scheduled Meds: . apixaban  2.5 mg Oral BID  . cloNIDine  0.1 mg  Oral BID  . feeding supplement  1 Container Oral TID BM  . feeding supplement (PRO-STAT SUGAR FREE 64)  30 mL Oral BID  . furosemide  20 mg Intravenous  Daily  . hydrALAZINE  50 mg Oral TID  . isosorbide mononitrate  30 mg Oral Daily  . losartan  100 mg Oral Daily  . polyethylene glycol  17 g Oral Daily  . [START ON 11/11/2019] potassium chloride  20 mEq Oral Daily  . senna-docusate  1 tablet Oral BID  . sodium chloride flush  3 mL Intravenous Once   Continuous Infusions: PRN Meds: acetaminophen, ondansetron (ZOFRAN) IV  Time spent: 35 minutes  Author: Berle Mull, MD [facility] 11/10/2019 5:40 PM  To reach On-call, see care teams to locate the attending and reach out to them via [email or web address]. If 7PM-7AM, please contact night-coverage If you still have difficulty reaching the attending provider, please page the [facility] (Director on Call) for [facility] on amion for assistance.

## 2019-11-10 NOTE — Progress Notes (Signed)
[facility]  ROUNDING NOTE   Subjective:   Na 125  Patient states her right hand is hurting from arthritis.   Objective:  Vital signs in last 24 hours:  Temp:  [98 F (36.7 C)-99.1 F (37.3 C)] 98 F (36.7 C) (03/15 2303) Pulse Rate:  [80-88] 88 (03/16 0814) Resp:  [16-20] 20 (03/16 0814) BP: (144-163)/(53-111) 163/111 (03/16 0814) SpO2:  [92 %-95 %] 92 % (03/16 0814)  Weight change:  Filed Weights   11/03/19 1351 11/04/19 1615  Weight: 49.9 kg 45 kg    Intake/Output: I/O last 3 completed shifts: In: 480 [P.O.:480] Out: 1400 [Urine:1400]   Intake/Output this shift:  Total I/O In: 360 [P.O.:360] Out: 200 [Urine:200]  Physical Exam: General: No acute distress, sitting in chair  Head: Normocephalic, atraumatic. Moist oral mucosal membranes  Eyes: Anicteric  Neck: Supple, trachea midline  Lungs:  Clear to auscultation, normal effort  Heart: irregular  Abdomen:  Soft, nontender, bowel sounds present  Extremities: + edema  Neurologic: Awake, alert, following commands  Skin: No lesions        Basic Metabolic Panel: Recent Labs  Lab 11/05/19 0459 11/05/19 0853 11/06/19 0523 11/06/19 0821 11/08/19 1221 11/08/19 1548 11/09/19 0406 11/09/19 0835 11/09/19 1613 11/09/19 2040 11/10/19 0007 11/10/19 0515 11/10/19 0743  NA 119*   < > 121*   < > 121*   < > 123*   < > 121* 126* 124* 126* 125*  K 3.5  --  3.4*  --  4.7  --  3.6  --   --   --   --  3.3*  --   CL 90*  --  96*  --  92*  --  91*  --   --   --   --  94*  --   CO2 21*  --  22  --  21*  --  22  --   --   --   --  24  --   GLUCOSE 95  --  92  --  116*  --  108*  --   --   --   --  104*  --   BUN 8  --  6*  --  14  --  17  --   --   --   --  18  --   CREATININE 0.46  --  0.51  --  0.41*  --  0.50  --   --   --   --  0.38*  --   CALCIUM 7.9*   < > 7.6*   < > 8.2*  --  7.8*  --   --   --   --  7.9*  --   MG 1.8  --  1.7  --   --   --   --   --   --   --   --   --   --    < > = values in this  interval not displayed.    Liver Function Tests: Recent Labs  Lab 11/05/19 0459 11/06/19 0523 11/08/19 1221 11/09/19 0406 11/10/19 0515  AST 39 32 32 26 23  ALT 20 17 20 17 17   ALKPHOS 50 50 61 50 49  BILITOT 2.3* 1.7* 1.6* 1.2 1.3*  PROT 6.3* 5.8* 6.5 5.7* 5.7*  ALBUMIN 3.5 3.3* 3.7 3.0* 3.0*   No results for input(s): LIPASE, AMYLASE in the last 168 hours. No results for input(s): AMMONIA in the last  168 hours.  CBC: Recent Labs  Lab 11/05/19 0459 11/06/19 0523 11/08/19 1221 11/09/19 0406 11/10/19 0515  WBC 6.9 6.4 9.6 8.9 6.4  NEUTROABS 5.2 4.7  --  7.0 4.8  HGB 12.7 11.9* 12.2 11.1* 11.9*  HCT 34.3* 33.3* 35.2* 31.3* 31.7*  MCV 92.2 95.4 99.4 96.6 96.1  PLT 218 190 197 170 176    Cardiac Enzymes: No results for input(s): CKTOTAL, CKMB, CKMBINDEX, TROPONINI in the last 168 hours.  BNP: Invalid input(s): POCBNP  CBG: No results for input(s): GLUCAP in the last 168 hours.  Microbiology: Results for orders placed or performed during the hospital encounter of 11/03/19  SARS CORONAVIRUS 2 (TAT 6-24 HRS) Nasopharyngeal Nasopharyngeal Swab     Status: None   Collection Time: 11/03/19  3:20 PM   Specimen: Nasopharyngeal Swab  Result Value Ref Range Status   SARS Coronavirus 2 NEGATIVE NEGATIVE Final    Comment: (NOTE) SARS-CoV-2 target nucleic acids are NOT DETECTED. The SARS-CoV-2 RNA is generally detectable in upper and lower respiratory specimens during the acute phase of infection. Negative results do not preclude SARS-CoV-2 infection, do not rule out co-infections with other pathogens, and should not be used as the sole basis for treatment or other patient management decisions. Negative results must be combined with clinical observations, patient history, and epidemiological information. The expected result is Negative. Fact Sheet for Patients: SugarRoll.be Fact Sheet for Healthcare  Providers: https://www.woods-mathews.com/ This test is not yet approved or cleared by the [place] FDA and  has been authorized for detection and/or diagnosis of SARS-CoV-2 by FDA under an Emergency Use Authorization (EUA). This EUA will remain  in effect (meaning this test can be used) for the duration of the COVID-19 declaration under Section 56 4(b)(1) of the Act, 21 U.S.C. section 360bbb-3(b)(1), unless the authorization is terminated or revoked sooner. Performed at [facility], [place] [address] [place], [place] [address]   MRSA PCR Screening     Status: None   Collection Time: 11/03/19  9:00 PM   Specimen: Nasopharyngeal  Result Value Ref Range Status   MRSA by PCR NEGATIVE NEGATIVE Final    Comment:        The GeneXpert MRSA Assay (FDA approved for NASAL specimens only), is one component of a comprehensive MRSA colonization surveillance program. It is not intended to diagnose MRSA infection nor to guide or monitor treatment for MRSA infections. Performed at [facility], [address] [place], [place] [address]     Coagulation Studies: No results for input(s): LABPROT, INR in the last 72 hours.  Urinalysis: No results for input(s): COLORURINE, LABSPEC, PHURINE, GLUCOSEU, HGBUR, BILIRUBINUR, KETONESUR, PROTEINUR, UROBILINOGEN, NITRITE, LEUKOCYTESUR in the last 72 hours.  Invalid input(s): APPERANCEUR    Imaging: No results found.   Medications:    . apixaban  2.5 mg Oral BID  . cloNIDine  0.1 mg Oral BID  . feeding supplement  1 Container Oral TID BM  . feeding supplement (PRO-STAT SUGAR FREE 64)  30 mL Oral BID  . furosemide  20 mg Intravenous Daily  . hydrALAZINE  50 mg Oral TID  . isosorbide mononitrate  30 mg Oral Daily  . losartan  100 mg Oral Daily  . polyethylene glycol  17 g Oral Daily  . [START ON 11/11/2019] potassium chloride  20 mEq Oral Daily  . potassium chloride  40 mEq Oral Once  . senna-docusate  1 tablet  Oral BID  . sodium chloride flush  3 mL  Intravenous Once   acetaminophen, ondansetron (ZOFRAN) IV  Assessment/ Plan:  86 y.o. female with a PMHx of hypertension, atrial fibrillation, prior episode of hyponatremia, cystocele, osteoporosis who was admitted to [facility] on 11/03/2019 for evaluation of severe hyponatremia.  1.  Hyponatremia.  Suspect secondary to use of chlorthalidone. Sodium 113 on admission.    Baseline sodium of 130 on  10/13/2019 - continue fluid restriction.   2.  Hypertension.  Blood pressure has been labile.  With atrial fibrillation Current regimen of clonidine, hydralazine, and losartan.   - IV furosemide 20mg      3. Hypokalemia - secondary to furosemide.  - PO potassium chloride.    LOS: [address] 3/16/202111:25 AM

## 2019-11-11 DIAGNOSIS — E871 Hypo-osmolality and hyponatremia: Principal | ICD-10-CM

## 2019-11-11 LAB — COMPREHENSIVE METABOLIC PANEL
ALT: 18 U/L (ref 0–44)
AST: 20 U/L (ref 15–41)
Albumin: 3 g/dL — ABNORMAL LOW (ref 3.5–5.0)
Alkaline Phosphatase: 55 U/L (ref 38–126)
Anion gap: 9 (ref 5–15)
BUN: 21 mg/dL (ref 8–23)
CO2: 25 mmol/L (ref 22–32)
Calcium: 8.4 mg/dL — ABNORMAL LOW (ref 8.9–10.3)
Chloride: 91 mmol/L — ABNORMAL LOW (ref 98–111)
Creatinine, Ser: 0.5 mg/dL (ref 0.44–1.00)
GFR calc Af Amer: >60 mL/min (ref >60)
GFR calc non Af Amer: >60 mL/min (ref >60)
Glucose, Bld: 102 mg/dL — ABNORMAL HIGH (ref 70–99)
Potassium: 4.2 mmol/L (ref 3.5–5.1)
Sodium: 125 mmol/L — ABNORMAL LOW (ref 135–145)
Total Bilirubin: 1.1 mg/dL (ref 0.3–1.2)
Total Protein: 5.8 g/dL — ABNORMAL LOW (ref 6.5–8.1)

## 2019-11-11 LAB — SODIUM
Sodium: 127 mmol/L — ABNORMAL LOW (ref 135–145)
Sodium: 123 mmol/L — ABNORMAL LOW (ref 135–145)
Sodium: 123 mmol/L — ABNORMAL LOW (ref 135–145)
Sodium: 127 mmol/L — ABNORMAL LOW (ref 135–145)

## 2019-11-11 LAB — CBC WITH DIFFERENTIAL/PLATELET
Abs Immature Granulocytes: 0.05 10*3/uL (ref 0.00–0.07)
Basophils Absolute: 0 10*3/uL (ref 0.0–0.1)
Basophils Relative: 0 %
Eosinophils Absolute: 0.1 10*3/uL (ref 0.0–0.5)
Eosinophils Relative: 1 %
HCT: 33.9 % — ABNORMAL LOW (ref 36.0–46.0)
Hemoglobin: 11.9 g/dL — ABNORMAL LOW (ref 12.0–15.0)
Immature Granulocytes: 1 %
Lymphocytes Relative: 10 %
Lymphs Abs: 0.8 10*3/uL (ref 0.7–4.0)
MCH: 34.1 pg — ABNORMAL HIGH (ref 26.0–34.0)
MCHC: 35.1 g/dL (ref 30.0–36.0)
MCV: 97.1 fL (ref 80.0–100.0)
Monocytes Absolute: 1.1 10*3/uL — ABNORMAL HIGH (ref 0.1–1.0)
Monocytes Relative: 14 %
Neutro Abs: 5.6 10*3/uL (ref 1.7–7.7)
Neutrophils Relative %: 74 %
Platelets: 227 10*3/uL (ref 150–400)
RBC: 3.49 MIL/uL — ABNORMAL LOW (ref 3.87–5.11)
RDW: 12.4 % (ref 11.5–15.5)
WBC: 7.6 10*3/uL (ref 4.0–10.5)
nRBC: 0 % (ref 0.0–0.2)

## 2019-11-11 MED ORDER — TOLVAPTAN 15 MG PO TABS
15.0000 mg | ORAL_TABLET | ORAL | Status: DC
  Administered 2019-11-11: 15 mg via ORAL
  Filled 2019-11-11 (×2): qty 1

## 2019-11-11 NOTE — Progress Notes (Signed)
Patients blood pressure- 111/49. Message to attending MD, he states to hold medication at this time. Will chart in mar. Patient aware.

## 2019-11-11 NOTE — Progress Notes (Signed)
PROGRESS NOTE    Clotine Heiner  WUJ:[id number] DOB: [birth year] DOA: 11/03/2019 PCP: Adin Hector, MD    Brief Narrative:  Lasonia Hudsonis an 86 y.o.femalewith PMH significant for HTN,hyponatremia and atrial fibrillation on Eliquis who was well until this morning when she felt dizzy. She said she had difficulty walking which is very unusual for her. She lives by herself in senior housing. Patient was found to have hypovolemic hyponatremia.  Treated with IV fluids. With sodium correction patient eventually developed hypervolemia now requiring Lasix. Currently further plan is continue sodium correction under the guidance of nephrology.    Consultants:   nephrology  Procedures:   Antimicrobials:       Subjective: Has no  Complaints today. Feels dizziness little better . No sob, cp, abd pain, n/v  Objective: Vitals:   11/11/19 0000 11/11/19 0010 11/11/19 0100 11/11/19 0200  BP:  137/60    Pulse: 71 75 72 (!) 53  Resp: 16 18 19 15   Temp:  97.9 F (36.6 C)    TempSrc:      SpO2: 95% 99% 96% 96%  Weight:      Height:        Intake/Output Summary (Last 24 hours) at 11/11/2019 0751 Last data filed at 11/11/2019 0300 Gross per 24 hour  Intake 600 ml  Output 200 ml  Net 400 ml   Filed Weights   11/03/19 1351 11/04/19 1615  Weight: 49.9 kg 45 kg    Examination:  General exam: Appears calm and comfortable  Respiratory system: Clear to auscultation. Respiratory effort normal. Cardiovascular system: S1 & S2 heard, RRR. No JVD, murmurs, rubs, gallops or clicks.  Gastrointestinal system: Abdomen is nondistended, soft and nontender. Normal bowel sounds heard. Central nervous system: Alert and oriented. Grossly intact Extremities: no edema Skin: warm, dry Psychiatry: Judgement and insight appear normal. Mood & affect appropriate.     Data Reviewed: I have personally reviewed following labs and imaging studies  CBC: Recent Labs  Lab 11/05/19 0459 11/05/19 0459  11/06/19 0523 11/08/19 1221 11/09/19 0406 11/10/19 0515 11/11/19 0430  WBC 6.9   < > 6.4 9.6 8.9 6.4 7.6  NEUTROABS 5.2  --  4.7  --  7.0 4.8 5.6  HGB 12.7   < > 11.9* 12.2 11.1* 11.9* 11.9*  HCT 34.3*   < > 33.3* 35.2* 31.3* 31.7* 33.9*  MCV 92.2   < > 95.4 99.4 96.6 96.1 97.1  PLT 218   < > 190 197 170 176 227   < > = values in this interval not displayed.   Basic Metabolic Panel: Recent Labs  Lab 11/05/19 0459 11/05/19 0853 11/06/19 0523 11/06/19 0821 11/08/19 1221 11/08/19 1548 11/09/19 0406 11/09/19 0835 11/10/19 0515 11/10/19 0515 11/10/19 0743 11/10/19 1217 11/10/19 1542 11/10/19 2002 11/11/19 0430  NA 119*   < > 121*   < > 121*   < > 123*   < > 126*   < > 125* 124* 124* 123* 125*  K 3.5   < > 3.4*  --  4.7  --  3.6  --  3.3*  --   --   --   --   --  4.2  CL 90*   < > 96*  --  92*  --  91*  --  94*  --   --   --   --   --  91*  CO2 21*   < > 22  --  21*  --  22  --  24  --   --   --   --   --  25  GLUCOSE 95   < > 92  --  116*  --  108*  --  104*  --   --   --   --   --  102*  BUN 8   < > 6*  --  14  --  17  --  18  --   --   --   --   --  21  CREATININE 0.46   < > 0.51  --  0.41*  --  0.50  --  0.38*  --   --   --   --   --  0.50  CALCIUM 7.9*   < > 7.6*  --  8.2*  --  7.8*  --  7.9*  --   --   --   --   --  8.4*  MG 1.8  --  1.7  --   --   --   --   --   --   --   --   --   --   --   --    < > = values in this interval not displayed.   GFR: Estimated Creatinine Clearance: 30 mL/min (by C-G formula based on SCr of 0.5 mg/dL). Liver Function Tests: Recent Labs  Lab 11/06/19 0523 11/08/19 1221 11/09/19 0406 11/10/19 0515 11/11/19 0430  AST 32 32 26 23 20   ALT 17 20 17 17 18   ALKPHOS 50 61 50 49 55  BILITOT 1.7* 1.6* 1.2 1.3* 1.1  PROT 5.8* 6.5 5.7* 5.7* 5.8*  ALBUMIN 3.3* 3.7 3.0* 3.0* 3.0*   No results for input(s): LIPASE, AMYLASE in the last 168 hours. No results for input(s): AMMONIA in the last 168 hours. Coagulation Profile: No results for  input(s): INR, PROTIME in the last 168 hours. Cardiac Enzymes: No results for input(s): CKTOTAL, CKMB, CKMBINDEX, TROPONINI in the last 168 hours. BNP (last 3 results) No results for input(s): PROBNP in the last 8760 hours. HbA1C: No results for input(s): HGBA1C in the last 72 hours. CBG: No results for input(s): GLUCAP in the last 168 hours. Lipid Profile: No results for input(s): CHOL, HDL, LDLCALC, TRIG, CHOLHDL, LDLDIRECT in the last 72 hours. Thyroid Function Tests: No results for input(s): TSH, T4TOTAL, FREET4, T3FREE, THYROIDAB in the last 72 hours. Anemia Panel: No results for input(s): VITAMINB12, FOLATE, FERRITIN, TIBC, IRON, RETICCTPCT in the last 72 hours. Sepsis Labs: No results for input(s): PROCALCITON, LATICACIDVEN in the last 168 hours.  Recent Results (from the past 240 hour(s))  SARS CORONAVIRUS 2 (TAT 6-24 HRS) Nasopharyngeal Nasopharyngeal Swab     Status: None   Collection Time: 11/03/19  3:20 PM   Specimen: Nasopharyngeal Swab  Result Value Ref Range Status   SARS Coronavirus 2 NEGATIVE NEGATIVE Final    Comment: (NOTE) SARS-CoV-2 target nucleic acids are NOT DETECTED. The SARS-CoV-2 RNA is generally detectable in upper and lower respiratory specimens during the acute phase of infection. Negative results do not preclude SARS-CoV-2 infection, do not rule out co-infections with other pathogens, and should not be used as the sole basis for treatment or other patient management decisions. Negative results must be combined with clinical observations, patient history, and epidemiological information. The expected result is Negative. Fact Sheet for Patients: Fact Sheet for Healthcare Providers: 01/03/20 This test is not yet approved or cleared by the [employer]  FDA and  has been authorized for detection and/or diagnosis of SARS-CoV-2 by FDA under an Emergency Use Authorization (EUA).  This EUA will remain  in effect (meaning this test can be used) for the duration of the COVID-19 declaration under Section 56 4(b)(1) of the Act, 21 U.S.C. section 360bbb-3(b)(1), unless the authorization is terminated or revoked sooner. Performed at [facility], [address], [place], [place] [address]   MRSA PCR Screening     Status: None   Collection Time: 11/03/19  9:00 PM   Specimen: Nasopharyngeal  Result Value Ref Range Status   MRSA by PCR NEGATIVE NEGATIVE Final    Comment:        The GeneXpert MRSA Assay (FDA approved for NASAL specimens only), is one component of a comprehensive MRSA colonization surveillance program. It is not intended to diagnose MRSA infection nor to guide or monitor treatment for MRSA infections. Performed at [facility], [address] [place], [place] [address]          Radiology Studies: No results found.      Scheduled Meds: . apixaban  2.5 mg Oral BID  . cloNIDine  0.1 mg Oral BID  . feeding supplement  1 Container Oral TID BM  . feeding supplement (PRO-STAT SUGAR FREE 64)  30 mL Oral BID  . furosemide  20 mg Intravenous Daily  . hydrALAZINE  50 mg Oral TID  . isosorbide mononitrate  30 mg Oral Daily  . losartan  100 mg Oral Daily  . polyethylene glycol  17 g Oral Daily  . potassium chloride  20 mEq Oral Daily  . senna-docusate  1 tablet Oral BID  . sodium chloride flush  3 mL Intravenous Once   Continuous Infusions:  Assessment & Plan:   Principal Problem:   Hyponatremia Active Problems:   HLD (hyperlipidemia)   BP (high blood pressure)   Atrial fibrillation, chronic (HCC)   1. Hyponatremia.  Multifactorial. Initially hypovolemic.  Now hypervolemic.  Initial presentation was thought to be secondary to chlorthalidone. Nephrology was consulted on admission, highly appreciate their assistance. Treated with IV normal saline with improvement in sodium. Later on due to hypervolemia IV fluids were  on hold which led to worsening of sodium and now requiring IV Lasix with improvement in sodium. Plan for Tolvapan to be given today Monitor sodium level, today 123.  2. Accelerated hypertension Home blood pressure medication include chlorthalidone 25 mg daily and clonidine 0.1 mg daily as well as losartan 100 mg daily. Currently clonidine is 0.1 mg twice daily. Continue on Lasix IV 20 mg daily. Hydralazine 50 mg 3 times daily, Imdur 30 mg daily as well as losartan 100 mg daily. bp better now.   3.Constipation Nausea resolved Continue bowel regimen. X-ray abdomen unremarkable for any acute abnormality.  4.Chronic A. fib Currently rate controlled. On Eliquis 2.5 mg twice daily which we will continue. At home patient is not on any rate control medication due to her bradycardia but currently tolerating clonidine 0.1 mg twice daily.  5.Acute metabolic encephalopathy. Dizziness. In the setting of hyponatremia. resolved.  6.  Hypokalemia. Replaced. Monitor.  7.  Poor appetite Body mass index is 20.04 kg/m.  Continue boost and prostat. Hoping that increase protein intake will also improve hyponatremia.  8.  Generalized fatigue and deconditioning In the setting of hyponatremia and poor p.o. intake. PT initially recommended SNF, now with improvement recommends home with home health. Monitor.    DVT prophylaxis: lovenox Code Status:full  Family Communication: none at bedside Disposition Plan: Pt is from ILF, admitted with dizziness due to hyponatremia, still has severe hyponatremia, which precludes a safe discharge. Discharge to ALF with home health, when sodium is corrected.       LOS: 8 days   Time spent: 45 min with >50% coc    Lynn Ito, MD [facility] Pager [phone]  If 7PM-7AM, please contact night-coverage [email or web address] Password [facility] 11/11/2019, 7:51 AM

## 2019-11-11 NOTE — Plan of Care (Signed)

## 2019-11-11 NOTE — Progress Notes (Signed)
Physical Therapy Treatment Patient Details Name: Yvette [name] MRN: [id number] DOB: [birth year] Today's Date: 11/11/2019    History of Present Illness 86 y/o female admitted due to weakness, dizziness. Found to have low sodium. PMH includes OA, HTN    PT Comments    Pt resting in bed upon PT arrival and eager to get OOB to walk.  Upon bringing pt's sheets down, pt's Pure[employer] noted to have not been working and pt needing assist with clean-up (pt, pt's gown, and pt's sheets wet with urine): NT notified and came to assist.  Then able to ambulate 200 feet with RW CGA to SBA; pt overall steady and safe with ambulation using RW; pt's HR fluctuating between 71-86 bpm at rest and HR fluctuating between 100-125 bpm with activity).  After returning to room, pt noted to be incontinent of stool and assisted to [facility]; assist required for clean-up and changing pt's gown and socks (NT arrived and assisted with clean-up).  Then pt assisted to sitting in recliner to rest end of session.  Pt reporting no pain during session.  Will continue to focus on strengthening, balance, and progressive functional mobility during hospitalization.    Follow Up Recommendations  Home health PT     Equipment Recommendations  Rolling walker with 5" wheels;3in1 (PT)    Recommendations for Other Services       Precautions / Restrictions Precautions Precautions: Fall Restrictions Weight Bearing Restrictions: No    Mobility  Bed Mobility   Bed Mobility: Supine to Sit     Supine to sit: Supervision     General bed mobility comments: mild increased effort to perform on own  Transfers Overall transfer level: Needs assistance Equipment used: Rolling walker (2 wheeled) Transfers: Sit to/from Stand Sit to Stand: Supervision         General transfer comment: mild increased effort to stand from bed with RW; stand step turn recliner to [facility] with RW SBA and BSC to recliner no AD with CGA (pt holding onto furniture with  UE's for support)  Ambulation/Gait Ambulation/Gait assistance: Min guard;Supervision Gait Distance (Feet): 200 Feet Assistive device: Rolling walker (2 wheeled) Gait Pattern/deviations: Step-through pattern;Decreased step length - right;Decreased step length - left Gait velocity: decreased   General Gait Details: steady with ambulation   Stairs             Wheelchair Mobility    Modified Rankin (Stroke Patients Only)       Balance Overall balance assessment: Needs assistance Sitting-balance support: No upper extremity supported;Feet supported Sitting balance-Leahy Scale: Good Sitting balance - Comments: steady sitting reaching within BOS   Standing balance support: Single extremity supported Standing balance-Leahy Scale: Fair Standing balance comment: pt requiring at least single UE support for static standing balance                            Cognition Arousal/Alertness: Awake/alert Behavior During Therapy: WFL for tasks assessed/performed Overall Cognitive Status: Within Functional Limits for tasks assessed                                        Exercises      General Comments   Nursing cleared pt for participation in physical therapy.  Pt agreeable to PT session.      Pertinent Vitals/Pain Pain Assessment: No/denies pain  O2 sats WFL on room air  during sessions activities.    Home Living                      Prior Function            PT Goals (current goals can now be found in the care plan section) Acute Rehab PT Goals Patient Stated Goal: to return home PT Goal Formulation: With patient Time For Goal Achievement: 11/19/19 Potential to Achieve Goals: Good Progress towards PT goals: Progressing toward goals    Frequency    Min 2X/week      PT Plan Current plan remains appropriate    Co-evaluation              AM-PAC PT "6 Clicks" Mobility   Outcome Measure  Help needed turning from your  back to your side while in a flat bed without using bedrails?: None Help needed moving from lying on your back to sitting on the side of a flat bed without using bedrails?: None   Help needed standing up from a chair using your arms (e.g., wheelchair or bedside chair)?: A Little Help needed to walk in hospital room?: A Little Help needed climbing 3-5 steps with a railing? : A Little 6 Click Score: 17    End of Session Equipment Utilized During Treatment: Gait belt Activity Tolerance: Patient tolerated treatment well Patient left: in chair;with call bell/phone within reach;with chair alarm set;Other (comment)(B heels floating via pillow) Nurse Communication: Mobility status;Precautions PT Visit Diagnosis: Unsteadiness on feet (R26.81);Muscle weakness (generalized) (M62.81);Difficulty in walking, not elsewhere classified (R26.2)     Time: 1400-1500 PT Time Calculation (min) (ACUTE ONLY): 60 min  Charges:  $Gait Training: 8-22 mins $Therapeutic Activity: 38-52 mins                     Hendricks Limes, PT 11/11/19, 3:29 PM

## 2019-11-11 NOTE — Progress Notes (Signed)
[facility]  ROUNDING NOTE   Subjective:   Na 123 (127)  Patient states she feels better.   Objective:  Vital signs in last 24 hours:  Temp:  [97.9 F (36.6 C)-98.4 F (36.9 C)] 98.4 F (36.9 C) (03/17 0753) Pulse Rate:  [53-75] 59 (03/17 0753) Resp:  [15-19] 16 (03/17 0753) BP: (113-166)/(60-73) 113/64 (03/17 0753) SpO2:  [95 %-99 %] 97 % (03/17 0753)  Weight change:  Filed Weights   11/03/19 1351 11/04/19 1615  Weight: 49.9 kg 45 kg    Intake/Output: I/O last 3 completed shifts: In: 720 [P.O.:720] Out: 1100 [Urine:1100]   Intake/Output this shift:  Total I/O In: 240 [P.O.:240] Out: -   Physical Exam: General: No acute distress, sitting in chair  Head: Normocephalic, atraumatic. Moist oral mucosal membranes  Eyes: Anicteric  Neck: Supple, trachea midline  Lungs:  Clear to auscultation, normal effort  Heart: irregular  Abdomen:  Soft, nontender, bowel sounds present  Extremities: trace edema  Neurologic: Awake, alert, following commands  Skin: No lesions        Basic Metabolic Panel: Recent Labs  Lab 11/05/19 0459 11/05/19 0853 11/06/19 0523 11/06/19 0821 11/08/19 1221 11/08/19 1548 11/09/19 0406 11/09/19 0835 11/10/19 0515 11/10/19 0743 11/10/19 1542 11/10/19 2002 11/11/19 0430 11/11/19 0805 11/11/19 1218  NA 119*   < > 121*   < > 121*   < > 123*   < > 126*   < > 124* 123* 125* 127* 123*  K 3.5   < > 3.4*  --  4.7  --  3.6  --  3.3*  --   --   --  4.2  --   --   CL 90*   < > 96*  --  92*  --  91*  --  94*  --   --   --  91*  --   --   CO2 21*   < > 22  --  21*  --  22  --  24  --   --   --  25  --   --   GLUCOSE 95   < > 92  --  116*  --  108*  --  104*  --   --   --  102*  --   --   BUN 8   < > 6*  --  14  --  17  --  18  --   --   --  21  --   --   CREATININE 0.46   < > 0.51  --  0.41*  --  0.50  --  0.38*  --   --   --  0.50  --   --   CALCIUM 7.9*   < > 7.6*   < > 8.2*   < > 7.8*  --  7.9*  --   --   --  8.4*  --   --   MG  1.8  --  1.7  --   --   --   --   --   --   --   --   --   --   --   --    < > = values in this interval not displayed.    Liver Function Tests: Recent Labs  Lab 11/06/19 0523 11/08/19 1221 11/09/19 0406 11/10/19 0515 11/11/19 0430  AST 32 32 26 23 20   ALT 17 20 17 17 18   ALKPHOS  50 61 50 49 55  BILITOT 1.7* 1.6* 1.2 1.3* 1.1  PROT 5.8* 6.5 5.7* 5.7* 5.8*  ALBUMIN 3.3* 3.7 3.0* 3.0* 3.0*   No results for input(s): LIPASE, AMYLASE in the last 168 hours. No results for input(s): AMMONIA in the last 168 hours.  CBC: Recent Labs  Lab 11/05/19 0459 11/05/19 0459 11/06/19 0523 11/08/19 1221 11/09/19 0406 11/10/19 0515 11/11/19 0430  WBC 6.9   < > 6.4 9.6 8.9 6.4 7.6  NEUTROABS 5.2  --  4.7  --  7.0 4.8 5.6  HGB 12.7   < > 11.9* 12.2 11.1* 11.9* 11.9*  HCT 34.3*   < > 33.3* 35.2* 31.3* 31.7* 33.9*  MCV 92.2   < > 95.4 99.4 96.6 96.1 97.1  PLT 218   < > 190 197 170 176 227   < > = values in this interval not displayed.    Cardiac Enzymes: No results for input(s): CKTOTAL, CKMB, CKMBINDEX, TROPONINI in the last 168 hours.  BNP: Invalid input(s): POCBNP  CBG: No results for input(s): GLUCAP in the last 168 hours.  Microbiology: Results for orders placed or performed during the hospital encounter of 11/03/19  SARS CORONAVIRUS 2 (TAT 6-24 HRS) Nasopharyngeal Nasopharyngeal Swab     Status: None   Collection Time: 11/03/19  3:20 PM   Specimen: Nasopharyngeal Swab  Result Value Ref Range Status   SARS Coronavirus 2 NEGATIVE NEGATIVE Final    Comment: (NOTE) SARS-CoV-2 target nucleic acids are NOT DETECTED. The SARS-CoV-2 RNA is generally detectable in upper and lower respiratory specimens during the acute phase of infection. Negative results do not preclude SARS-CoV-2 infection, do not rule out co-infections with other pathogens, and should not be used as the sole basis for treatment or other patient management decisions. Negative results must be combined with  clinical observations, patient history, and epidemiological information. The expected result is Negative. Fact Sheet for Patients: SugarRoll.be Fact Sheet for Healthcare Providers: https://www.woods-mathews.com/ This test is not yet approved or cleared by the [place] FDA and  has been authorized for detection and/or diagnosis of SARS-CoV-2 by FDA under an Emergency Use Authorization (EUA). This EUA will remain  in effect (meaning this test can be used) for the duration of the COVID-19 declaration under Section 56 4(b)(1) of the Act, 21 U.S.C. section 360bbb-3(b)(1), unless the authorization is terminated or revoked sooner. Performed at [facility], [place] [address] [place], [place] [address]   MRSA PCR Screening     Status: None   Collection Time: 11/03/19  9:00 PM   Specimen: Nasopharyngeal  Result Value Ref Range Status   MRSA by PCR NEGATIVE NEGATIVE Final    Comment:        The GeneXpert MRSA Assay (FDA approved for NASAL specimens only), is one component of a comprehensive MRSA colonization surveillance program. It is not intended to diagnose MRSA infection nor to guide or monitor treatment for MRSA infections. Performed at [facility], [address] [place], [place] [address]     Coagulation Studies: No results for input(s): LABPROT, INR in the last 72 hours.  Urinalysis: No results for input(s): COLORURINE, LABSPEC, PHURINE, GLUCOSEU, HGBUR, BILIRUBINUR, KETONESUR, PROTEINUR, UROBILINOGEN, NITRITE, LEUKOCYTESUR in the last 72 hours.  Invalid input(s): APPERANCEUR    Imaging: No results found.   Medications:    . apixaban  2.5 mg Oral BID  . cloNIDine  0.1 mg Oral BID  . feeding supplement  1 Container Oral TID BM  .  feeding supplement (PRO-STAT SUGAR FREE 64)  30 mL Oral BID  . furosemide  20 mg Intravenous Daily  . hydrALAZINE  50 mg Oral TID  . isosorbide mononitrate  30 mg Oral Daily   . losartan  100 mg Oral Daily  . polyethylene glycol  17 g Oral Daily  . potassium chloride  20 mEq Oral Daily  . senna-docusate  1 tablet Oral BID  . sodium chloride flush  3 mL Intravenous Once   acetaminophen, ondansetron (ZOFRAN) IV  Assessment/ Plan:  86 y.o. female with a PMHx of hypertension, atrial fibrillation, prior episode of hyponatremia, cystocele, osteoporosis who was admitted to [facility] on 11/03/2019 for evaluation of severe hyponatremia.  1.  Hyponatremia.  Suspect secondary to use of chlorthalidone. Sodium 113 on admission.    Baseline sodium of 130 on  10/13/2019 - start tolvaptan   2.  Hypertension.  Blood pressure has been labile.  With atrial fibrillation Current regimen of clonidine, hydralazine, and losartan.   - discontinue furosemide.   3. Hypokalemia - secondary to furosemide.  - PO potassium chloride.    LOS: 8 Yvette [name] 3/17/20211:24 PM

## 2019-11-12 LAB — CBC WITH DIFFERENTIAL/PLATELET
Abs Immature Granulocytes: 0.04 10*3/uL (ref 0.00–0.07)
Basophils Absolute: 0 10*3/uL (ref 0.0–0.1)
Basophils Relative: 1 %
Eosinophils Absolute: 0.1 10*3/uL (ref 0.0–0.5)
Eosinophils Relative: 1 %
HCT: 31.7 % — ABNORMAL LOW (ref 36.0–46.0)
Hemoglobin: 11 g/dL — ABNORMAL LOW (ref 12.0–15.0)
Immature Granulocytes: 1 %
Lymphocytes Relative: 15 %
Lymphs Abs: 0.8 10*3/uL (ref 0.7–4.0)
MCH: 34.2 pg — ABNORMAL HIGH (ref 26.0–34.0)
MCHC: 34.7 g/dL (ref 30.0–36.0)
MCV: 98.4 fL (ref 80.0–100.0)
Monocytes Absolute: 0.7 10*3/uL (ref 0.1–1.0)
Monocytes Relative: 15 %
Neutro Abs: 3.4 10*3/uL (ref 1.7–7.7)
Neutrophils Relative %: 67 %
Platelets: 210 10*3/uL (ref 150–400)
RBC: 3.22 MIL/uL — ABNORMAL LOW (ref 3.87–5.11)
RDW: 12.5 % (ref 11.5–15.5)
WBC: 5 10*3/uL (ref 4.0–10.5)
nRBC: 0 % (ref 0.0–0.2)

## 2019-11-12 LAB — COMPREHENSIVE METABOLIC PANEL
ALT: 16 U/L (ref 0–44)
AST: 20 U/L (ref 15–41)
Albumin: 2.9 g/dL — ABNORMAL LOW (ref 3.5–5.0)
Alkaline Phosphatase: 47 U/L (ref 38–126)
Anion gap: 8 (ref 5–15)
BUN: 28 mg/dL — ABNORMAL HIGH (ref 8–23)
CO2: 26 mmol/L (ref 22–32)
Calcium: 8.4 mg/dL — ABNORMAL LOW (ref 8.9–10.3)
Chloride: 96 mmol/L — ABNORMAL LOW (ref 98–111)
Creatinine, Ser: 0.64 mg/dL (ref 0.44–1.00)
GFR calc Af Amer: >60 mL/min (ref >60)
GFR calc non Af Amer: >60 mL/min (ref >60)
Glucose, Bld: 110 mg/dL — ABNORMAL HIGH (ref 70–99)
Potassium: 4.4 mmol/L (ref 3.5–5.1)
Sodium: 130 mmol/L — ABNORMAL LOW (ref 135–145)
Total Bilirubin: 1 mg/dL (ref 0.3–1.2)
Total Protein: 5.7 g/dL — ABNORMAL LOW (ref 6.5–8.1)

## 2019-11-12 LAB — RESPIRATORY PANEL BY RT PCR (FLU A&B, COVID)
Influenza A by PCR: NEGATIVE
Influenza B by PCR: NEGATIVE
SARS Coronavirus 2 by RT PCR: NEGATIVE

## 2019-11-12 MED ORDER — CLONIDINE HCL 0.1 MG PO TABS: 0.1000 mg | ORAL_TABLET | Freq: Two times a day (BID) | ORAL | 11 refills | Status: AC

## 2019-11-12 MED ORDER — HYDRALAZINE HCL 10 MG PO TABS: 10.0000 mg | ORAL_TABLET | Freq: Three times a day (TID) | ORAL | Status: AC

## 2019-11-12 MED ORDER — FUROSEMIDE 20 MG PO TABS
20.0000 mg | ORAL_TABLET | Freq: Every day | ORAL | Status: DC
  Administered 2019-11-13: 20 mg via ORAL
  Filled 2019-11-12: qty 1

## 2019-11-12 MED ORDER — HYDRALAZINE HCL 10 MG PO TABS
10.0000 mg | ORAL_TABLET | Freq: Three times a day (TID) | ORAL | Status: DC
  Administered 2019-11-12 – 2019-11-13 (×4): 10 mg via ORAL
  Filled 2019-11-12 (×6): qty 1

## 2019-11-12 NOTE — Progress Notes (Signed)
Dr. Wynelle Link d/c her daily IV Lasix and changed it to daily po Lasix. She has had her IV Lasix for today. Messaged Dr. Wynelle Link and asked if she was supposed to get the po today as well and he said that the po is  to start tomorrow.

## 2019-11-12 NOTE — Care Management Important Message (Signed)
Important Message  Patient Details  Name: Yvette [name] MRN: [id number] Date of Birth: May 31, 1924   Medicare Important Message Given:  Yes Patient not in room, message left, notified by phone.      Trenton Founds, RN 11/12/2019, 10:47 AM

## 2019-11-12 NOTE — TOC Progression Note (Signed)
Transition of Care [facility]) - Progression Note    Patient Details  Name: Yvette [name] MRN: [id number] Date of Birth: March 14, 1924  [facility] CM/SW Contact  Allayne Butcher, RN Phone Number: 11/12/2019, 3:56 PM  Clinical Narrative:    Plan for discharge to [facility] pending insurance authorization from [employer].     Expected Discharge Plan: Skilled Nursing Facility Barriers to Discharge: Continued Medical Work up  Expected Discharge Plan and Services Expected Discharge Plan: Skilled Nursing Facility   Discharge Planning Services: CM Consult Post Acute Care Choice: Skilled Nursing Facility Living arrangements for the past 2 months: Apartment, Assisted Living Facility Expected Discharge Date: 11/12/19                                     Social Determinants of Health (SDOH) Interventions    Readmission Risk Interventions No flowsheet data found.

## 2019-11-12 NOTE — Discharge Summary (Signed)
Yvette [name] CXK:[id number] DOB: September 26, 1923 DOA: 11/03/2019  PCP: Lynnea Ferrier, MD  Admit date: 11/03/2019 Discharge date: 11/12/2019  [facility]  Recommendations for Outpatient Follow-up:  1. Follow up with PCP in 1 week 2. Please obtain BMP/CBC in one week 3. Nephrology Dr. Wynelle Link in one week     Discharge Condition:Stable CODE STATUS:full  Diet recommendation: Heart Healthy  Brief/Interim Summary: Yvette [name] is an 86 y.o. female with PMH significant for HTN, hyponatremia and atrial fibrillation on Eliquis who was well until this morning when she felt dizzy. The patient was seen in the ED and noted to have a sodium of 113.  Of note, she had been started on chlorthalidone by her PCP more than 6 weeks ago but patient is not sure how long ago for treatment of very difficult to control hypertension.  She was started on ivf for hydration. Then patient devenloped hypervolemia and was started on lasix. Her sodium level was 123. Then she was given Tolvaptan with improvement of Na 130. She feels better today. Denies any sx. She is ready for [facility].Her Chlorthialidone was discontinued. She was started on hydralazine. She will need dietary supplements such as Boost TID.  Discharge Diagnoses:  Principal Problem:   Hyponatremia Active Problems:   HLD (hyperlipidemia)   BP (high blood pressure)   Atrial fibrillation, chronic (HCC)    Discharge Instructions  Discharge Instructions    Diet - low sodium heart healthy   Complete by: As directed    Increase activity slowly   Complete by: As directed      Allergies as of 11/12/2019      Reactions   Metoprolol Swelling   In legs In legs   Amlodipine Other (See Comments)   edema   Hydrocodone-acetaminophen Nausea Only      Medication List    STOP taking these medications   chlorthalidone 25 MG tablet Commonly known as: HYGROTON     TAKE these medications    apixaban 2.5 MG Tabs tablet Commonly known as: ELIQUIS Take 1 tablet (2.5 mg total) by mouth 2 (two) times daily.   calcium citrate-vitamin D 315-200 MG-UNIT tablet Commonly known as: CITRACAL+D Take 1 tablet by mouth daily.   cloNIDine 0.1 MG tablet Commonly known as: CATAPRES Take 1 tablet (0.1 mg total) by mouth 2 (two) times daily. What changed: when to take this   hydrALAZINE 10 MG tablet Commonly known as: APRESOLINE Take 1 tablet (10 mg total) by mouth 3 (three) times daily.   losartan 100 MG tablet Commonly known as: COZAAR Take 100 mg by mouth daily.       Contact information for follow-up providers    Kolluru, Sarath, MD Follow up in 1 week(s).   Specialty: Nephrology Contact information: [address] [place] [address] [phone]        Curtis Sites III, MD Follow up in 3 day(s).   Specialty: Internal Medicine Why: discuss bp meds and blood work Contact information: [address] [facility] [place] [address] [phone]            Contact information for after-discharge care    Destination    [facility] .   Service: Skilled Nursing Contact information: [address] [place] [address] [phone]                 Allergies  Allergen  Reactions  . Metoprolol Swelling    In legs In legs   . Amlodipine Other (See Comments)    edema  . Hydrocodone-Acetaminophen Nausea Only    Consultations:     Procedures/Studies: DG Chest Port 1 View  Result Date: 11/06/2019 CLINICAL DATA:  Shortness of breath EXAM: PORTABLE CHEST 1 VIEW COMPARISON:  09/18/2006 FINDINGS: Cardiac shadow is mildly enlarged. Aortic calcifications are seen. The lungs are well aerated bilaterally. Nipple shadows are noted. No acute bony abnormality is seen. IMPRESSION: No acute abnormality noted. Electronically Signed   By: Inez Catalina M.D.   On: 11/06/2019 19:00    DG Abd Portable 1V  Result Date: 11/04/2019 CLINICAL DATA:  Nausea, vomiting. EXAM: PORTABLE ABDOMEN - 1 VIEW COMPARISON:  None. FINDINGS: The bowel gas pattern is normal. No radio-opaque calculi or other significant radiographic abnormality are seen. IMPRESSION: No evidence of bowel obstruction or ileus. Electronically Signed   By: Marijo Conception M.D.   On: 11/04/2019 15:09      Subjective: Feels well. Denies sob, cp, dizziness, or lightheadeness  Discharge Exam: Vitals:   11/11/19 2359 11/12/19 0743  BP: (!) 111/48 (!) 163/65  Pulse: 62 72  Resp: 17 17  Temp: 98.6 F (37 C) 99 F (37.2 C)  SpO2: 98% 95%   Vitals:   11/11/19 1555 11/11/19 1608 11/11/19 2359 11/12/19 0743  BP: (!) 128/50 (!) 111/49 (!) 111/48 (!) 163/65  Pulse: 67  62 72  Resp: 16  17 17   Temp: 97.9 F (36.6 C)  98.6 F (37 C) 99 F (37.2 C)  TempSrc: Oral  Oral Oral  SpO2: 98%  98% 95%  Weight:      Height:        General: Pt is alert, awake, not in acute distress Cardiovascular: RRR, S1/S2 +, no rubs, no gallops Respiratory: CTA bilaterally, no wheezing, no rhonchi Abdominal: Soft, NT, ND, bowel sounds + Extremities: trace pedal edema, no cyanosis    The results of significant diagnostics from this hospitalization (including imaging, microbiology, ancillary and laboratory) are listed below for reference.     Microbiology: Recent Results (from the past 240 hour(s))  SARS CORONAVIRUS 2 (TAT 6-24 HRS) Nasopharyngeal Nasopharyngeal Swab     Status: None   Collection Time: 11/03/19  3:20 PM   Specimen: Nasopharyngeal Swab  Result Value Ref Range Status   SARS Coronavirus 2 NEGATIVE NEGATIVE Final    Comment: (NOTE) SARS-CoV-2 target nucleic acids are NOT DETECTED. The SARS-CoV-2 RNA is generally detectable in upper and lower respiratory specimens during the acute phase of infection. Negative results do not preclude SARS-CoV-2 infection, do not rule out co-infections with other pathogens,  and should not be used as the sole basis for treatment or other patient management decisions. Negative results must be combined with clinical observations, patient history, and epidemiological information. The expected result is Negative. Fact Sheet for Patients: SugarRoll.be Fact Sheet for Healthcare Providers: https://www.woods-mathews.com/ This test is not yet approved or cleared by the [place] FDA and  has been authorized for detection and/or diagnosis of SARS-CoV-2 by FDA under an Emergency Use Authorization (EUA). This EUA will remain  in effect (meaning this test can be used) for the duration of the COVID-19 declaration under Section 56 4(b)(1) of the Act, 21 U.S.C. section 360bbb-3(b)(1), unless the authorization is terminated or revoked sooner. Performed at [facility], [place] [address] [place], [place] [address]   MRSA PCR Screening     Status: None  Collection Time: 11/03/19  9:00 PM   Specimen: Nasopharyngeal  Result Value Ref Range Status   MRSA by PCR NEGATIVE NEGATIVE Final    Comment:        The GeneXpert MRSA Assay (FDA approved for NASAL specimens only), is one component of a comprehensive MRSA colonization surveillance program. It is not intended to diagnose MRSA infection nor to guide or monitor treatment for MRSA infections. Performed at [facility], [address] [place], [place] [address]      Labs: BNP (last 3 results) No results for input(s): BNP in the last 8760 hours. Basic Metabolic Panel: Recent Labs  Lab 11/06/19 0523 11/06/19 0821 11/08/19 1221 11/08/19 1548 11/09/19 0406 11/09/19 0835 11/10/19 0515 11/10/19 0743 11/11/19 0430 11/11/19 0430 11/11/19 0805 11/11/19 1218 11/11/19 1453 11/11/19 2238 11/12/19 0422  NA 121*   < > 121*   < > 123*   < > 126*   < > 125*   < > 127* 123* 123* 127* 130*  K 3.4*   < > 4.7  --  3.6  --  3.3*  --  4.2  --   --   --   --   --   4.4  CL 96*   < > 92*  --  91*  --  94*  --  91*  --   --   --   --   --  96*  CO2 22   < > 21*  --  22  --  24  --  25  --   --   --   --   --  26  GLUCOSE 92   < > 116*  --  108*  --  104*  --  102*  --   --   --   --   --  110*  BUN 6*   < > 14  --  17  --  18  --  21  --   --   --   --   --  28*  CREATININE 0.51   < > 0.41*  --  0.50  --  0.38*  --  0.50  --   --   --   --   --  0.64  CALCIUM 7.6*   < > 8.2*  --  7.8*  --  7.9*  --  8.4*  --   --   --   --   --  8.4*  MG 1.7  --   --   --   --   --   --   --   --   --   --   --   --   --   --    < > = values in this interval not displayed.   Liver Function Tests: Recent Labs  Lab 11/08/19 1221 11/09/19 0406 11/10/19 0515 11/11/19 0430 11/12/19 0422  AST 32 26 23 20 20   ALT 20 17 17 18 16   ALKPHOS 61 50 49 55 47  BILITOT 1.6* 1.2 1.3* 1.1 1.0  PROT 6.5 5.7* 5.7* 5.8* 5.7*  ALBUMIN 3.7 3.0* 3.0* 3.0* 2.9*   No results for input(s): LIPASE, AMYLASE in the last 168 hours. No results for input(s): AMMONIA in the last 168 hours. CBC: Recent Labs  Lab 11/06/19 0523 11/06/19 0523 11/08/19 1221 11/09/19 0406 11/10/19 0515 11/11/19 0430 11/12/19 0422  WBC 6.4   < > 9.6 8.9 6.4 7.6 5.0  NEUTROABS 4.7  --   --  7.0 4.8 5.6 3.4  HGB 11.9*   < > 12.2 11.1* 11.9* 11.9* 11.0*  HCT 33.3*   < > 35.2* 31.3* 31.7* 33.9* 31.7*  MCV 95.4   < > 99.4 96.6 96.1 97.1 98.4  PLT 190   < > 197 170 176 227 210   < > = values in this interval not displayed.   Cardiac Enzymes: No results for input(s): CKTOTAL, CKMB, CKMBINDEX, TROPONINI in the last 168 hours. BNP: Invalid input(s): POCBNP CBG: No results for input(s): GLUCAP in the last 168 hours. D-Dimer No results for input(s): DDIMER in the last 72 hours. Hgb A1c No results for input(s): HGBA1C in the last 72 hours. Lipid Profile No results for input(s): CHOL, HDL, LDLCALC, TRIG, CHOLHDL, LDLDIRECT in the last 72 hours. Thyroid function studies No results for input(s): TSH, T4TOTAL,  T3FREE, THYROIDAB in the last 72 hours.  Invalid input(s): FREET3 Anemia work up No results for input(s): VITAMINB12, FOLATE, FERRITIN, TIBC, IRON, RETICCTPCT in the last 72 hours. Urinalysis    Component Value Date/Time   COLORURINE YELLOW (A) 11/03/2019 1424   APPEARANCEUR CLEAR (A) 11/03/2019 1424   LABSPEC 1.008 11/03/2019 1424   PHURINE 7.0 11/03/2019 1424   GLUCOSEU NEGATIVE 11/03/2019 1424   HGBUR NEGATIVE 11/03/2019 1424   BILIRUBINUR NEGATIVE 11/03/2019 1424   KETONESUR 5 (A) 11/03/2019 1424   PROTEINUR NEGATIVE 11/03/2019 1424   NITRITE NEGATIVE 11/03/2019 1424   LEUKOCYTESUR NEGATIVE 11/03/2019 1424   Sepsis Labs Invalid input(s): PROCALCITONIN,  WBC,  LACTICIDVEN Microbiology Recent Results (from the past 240 hour(s))  SARS CORONAVIRUS 2 (TAT 6-24 HRS) Nasopharyngeal Nasopharyngeal Swab     Status: None   Collection Time: 11/03/19  3:20 PM   Specimen: Nasopharyngeal Swab  Result Value Ref Range Status   SARS Coronavirus 2 NEGATIVE NEGATIVE Final    Comment: (NOTE) SARS-CoV-2 target nucleic acids are NOT DETECTED. The SARS-CoV-2 RNA is generally detectable in upper and lower respiratory specimens during the acute phase of infection. Negative results do not preclude SARS-CoV-2 infection, do not rule out co-infections with other pathogens, and should not be used as the sole basis for treatment or other patient management decisions. Negative results must be combined with clinical observations, patient history, and epidemiological information. The expected result is Negative. Fact Sheet for Patients: HairSlick.no Fact Sheet for Healthcare Providers: quierodirigir.com This test is not yet approved or cleared by the [place] FDA and  has been authorized for detection and/or diagnosis of SARS-CoV-2 by FDA under an Emergency Use Authorization (EUA). This EUA will remain  in effect (meaning this test can be  used) for the duration of the COVID-19 declaration under Section 56 4(b)(1) of the Act, 21 U.S.C. section 360bbb-3(b)(1), unless the authorization is terminated or revoked sooner. Performed at [facility], [address] [place], [place] [address]   MRSA PCR Screening     Status: None   Collection Time: 11/03/19  9:00 PM   Specimen: Nasopharyngeal  Result Value Ref Range Status   MRSA by PCR NEGATIVE NEGATIVE Final    Comment:        The GeneXpert MRSA Assay (FDA approved for NASAL specimens only), is one component of a comprehensive MRSA colonization surveillance program. It is not intended to diagnose MRSA infection nor to guide or monitor treatment for MRSA infections. Performed at [facility], [address] [place], [place] [address]      Time coordinating discharge: Over 30 minutes  SIGNED:   Freddi Che  Marylu LundAmery, MD  [facility] 11/12/2019, 12:38 PM Pager   If 7PM-7AM, please contact night-coverage [email or web address] Password [email or web address]

## 2019-11-12 NOTE — Progress Notes (Signed)
[facility]  ROUNDING NOTE   Subjective:   tolvaptan yesterday 15mg   Na 130  Objective:  Vital signs in last 24 hours:  Temp:  [97.9 F (36.6 C)-99 F (37.2 C)] 99 F (37.2 C) (03/18 0743) Pulse Rate:  [62-72] 72 (03/18 0743) Resp:  [16-17] 17 (03/18 0743) BP: (111-163)/(48-65) 163/65 (03/18 0743) SpO2:  [95 %-98 %] 95 % (03/18 0743)  Weight change:  Filed Weights   11/03/19 1351 11/04/19 1615  Weight: 49.9 kg 45 kg    Intake/Output: I/O last 3 completed shifts: In: 480 [P.O.:480] Out: 700 [Urine:700]   Intake/Output this shift:  Total I/O In: 360 [P.O.:360] Out: 1700 [Urine:1700]  Physical Exam: General: No acute distress, sitting in chair  Head: Normocephalic, atraumatic. Moist oral mucosal membranes  Eyes: Anicteric  Neck: Supple, trachea midline  Lungs:  Clear to auscultation, normal effort  Heart: irregular  Abdomen:  Soft, nontender, bowel sounds present  Extremities: trace edema  Neurologic: Awake, alert, following commands  Skin: No lesions        Basic Metabolic Panel: Recent Labs  Lab 11/06/19 0523 11/06/19 0821 11/08/19 1221 11/08/19 1548 11/09/19 0406 11/09/19 0835 11/10/19 0515 11/10/19 0743 11/11/19 0430 11/11/19 0430 11/11/19 0805 11/11/19 1218 11/11/19 1453 11/11/19 2238 11/12/19 0422  NA 121*   < > 121*   < > 123*   < > 126*   < > 125*   < > 127* 123* 123* 127* 130*  K 3.4*   < > 4.7  --  3.6  --  3.3*  --  4.2  --   --   --   --   --  4.4  CL 96*   < > 92*  --  91*  --  94*  --  91*  --   --   --   --   --  96*  CO2 22   < > 21*  --  22  --  24  --  25  --   --   --   --   --  26  GLUCOSE 92   < > 116*  --  108*  --  104*  --  102*  --   --   --   --   --  110*  BUN 6*   < > 14  --  17  --  18  --  21  --   --   --   --   --  28*  CREATININE 0.51   < > 0.41*  --  0.50  --  0.38*  --  0.50  --   --   --   --   --  0.64  CALCIUM 7.6*   < > 8.2*   < > 7.8*  --  7.9*  --  8.4*  --   --   --   --   --  8.4*  MG 1.7   --   --   --   --   --   --   --   --   --   --   --   --   --   --    < > = values in this interval not displayed.    Liver Function Tests: Recent Labs  Lab 11/08/19 1221 11/09/19 0406 11/10/19 0515 11/11/19 0430 11/12/19 0422  AST 32 26 23 20 20   ALT 20 17 17 18 16   ALKPHOS 61 50 49  55 47  BILITOT 1.6* 1.2 1.3* 1.1 1.0  PROT 6.5 5.7* 5.7* 5.8* 5.7*  ALBUMIN 3.7 3.0* 3.0* 3.0* 2.9*   No results for input(s): LIPASE, AMYLASE in the last 168 hours. No results for input(s): AMMONIA in the last 168 hours.  CBC: Recent Labs  Lab 11/06/19 0523 11/06/19 0523 11/08/19 1221 11/09/19 0406 11/10/19 0515 11/11/19 0430 11/12/19 0422  WBC 6.4   < > 9.6 8.9 6.4 7.6 5.0  NEUTROABS 4.7  --   --  7.0 4.8 5.6 3.4  HGB 11.9*   < > 12.2 11.1* 11.9* 11.9* 11.0*  HCT 33.3*   < > 35.2* 31.3* 31.7* 33.9* 31.7*  MCV 95.4   < > 99.4 96.6 96.1 97.1 98.4  PLT 190   < > 197 170 176 227 210   < > = values in this interval not displayed.    Cardiac Enzymes: No results for input(s): CKTOTAL, CKMB, CKMBINDEX, TROPONINI in the last 168 hours.  BNP: Invalid input(s): POCBNP  CBG: No results for input(s): GLUCAP in the last 168 hours.  Microbiology: Results for orders placed or performed during the hospital encounter of 11/03/19  SARS CORONAVIRUS 2 (TAT 6-24 HRS) Nasopharyngeal Nasopharyngeal Swab     Status: None   Collection Time: 11/03/19  3:20 PM   Specimen: Nasopharyngeal Swab  Result Value Ref Range Status   SARS Coronavirus 2 NEGATIVE NEGATIVE Final    Comment: (NOTE) SARS-CoV-2 target nucleic acids are NOT DETECTED. The SARS-CoV-2 RNA is generally detectable in upper and lower respiratory specimens during the acute phase of infection. Negative results do not preclude SARS-CoV-2 infection, do not rule out co-infections with other pathogens, and should not be used as the sole basis for treatment or other patient management decisions. Negative results must be combined with clinical  observations, patient history, and epidemiological information. The expected result is Negative. Fact Sheet for Patients: SugarRoll.be Fact Sheet for Healthcare Providers: https://www.woods-mathews.com/ This test is not yet approved or cleared by the [place] FDA and  has been authorized for detection and/or diagnosis of SARS-CoV-2 by FDA under an Emergency Use Authorization (EUA). This EUA will remain  in effect (meaning this test can be used) for the duration of the COVID-19 declaration under Section 56 4(b)(1) of the Act, 21 U.S.C. section 360bbb-3(b)(1), unless the authorization is terminated or revoked sooner. Performed at [facility], [place] [address] [place], [place] [address]   MRSA PCR Screening     Status: None   Collection Time: 11/03/19  9:00 PM   Specimen: Nasopharyngeal  Result Value Ref Range Status   MRSA by PCR NEGATIVE NEGATIVE Final    Comment:        The GeneXpert MRSA Assay (FDA approved for NASAL specimens only), is one component of a comprehensive MRSA colonization surveillance program. It is not intended to diagnose MRSA infection nor to guide or monitor treatment for MRSA infections. Performed at [facility], [address] [place],  [address]     Coagulation Studies: No results for input(s): LABPROT, INR in the last 72 hours.  Urinalysis: No results for input(s): COLORURINE, LABSPEC, PHURINE, GLUCOSEU, HGBUR, BILIRUBINUR, KETONESUR, PROTEINUR, UROBILINOGEN, NITRITE, LEUKOCYTESUR in the last 72 hours.  Invalid input(s): APPERANCEUR    Imaging: No results found.   Medications:    . apixaban  2.5 mg Oral BID  . cloNIDine  0.1 mg Oral BID  . feeding supplement  1 Container Oral TID BM  . feeding supplement (PRO-STAT  SUGAR FREE 64)  30 mL Oral BID  . furosemide  20 mg Intravenous Daily  . hydrALAZINE  10 mg Oral TID  . losartan  100 mg Oral Daily  . polyethylene glycol   17 g Oral Daily  . potassium chloride  20 mEq Oral Daily  . senna-docusate  1 tablet Oral BID  . sodium chloride flush  3 mL Intravenous Once   acetaminophen, ondansetron (ZOFRAN) IV  Assessment/ Plan:  86 y.o. female with a PMHx of hypertension, atrial fibrillation, prior episode of hyponatremia, cystocele, osteoporosis who was admitted to [facility] on 11/03/2019 for evaluation of severe hyponatremia.  1.  Hyponatremia.  Suspect secondary to use of chlorthalidone. Sodium 113 on admission.    Baseline sodium of 130 on  10/13/2019 Status post tolvaptan.   2.  Hypertension.  Blood pressure has been labile.  With atrial fibrillation Current regimen of clonidine, hydralazine, and losartan.   - PO furosemide.   3. Hypokalemia: secondary to furosemide.  - PO potassium chloride.    LOS: 9 Yvette [name] 3/18/202110:40 AM

## 2019-11-13 LAB — COMPREHENSIVE METABOLIC PANEL
ALT: 17 U/L (ref 0–44)
AST: 20 U/L (ref 15–41)
Albumin: 3.1 g/dL — ABNORMAL LOW (ref 3.5–5.0)
Alkaline Phosphatase: 54 U/L (ref 38–126)
Anion gap: 9 (ref 5–15)
BUN: 28 mg/dL — ABNORMAL HIGH (ref 8–23)
CO2: 26 mmol/L (ref 22–32)
Calcium: 8.3 mg/dL — ABNORMAL LOW (ref 8.9–10.3)
Chloride: 99 mmol/L (ref 98–111)
Creatinine, Ser: 0.56 mg/dL (ref 0.44–1.00)
GFR calc Af Amer: >60 mL/min (ref >60)
GFR calc non Af Amer: >60 mL/min (ref >60)
Glucose, Bld: 104 mg/dL — ABNORMAL HIGH (ref 70–99)
Potassium: 4.6 mmol/L (ref 3.5–5.1)
Sodium: 134 mmol/L — ABNORMAL LOW (ref 135–145)
Total Bilirubin: 1 mg/dL (ref 0.3–1.2)
Total Protein: 6 g/dL — ABNORMAL LOW (ref 6.5–8.1)

## 2019-11-13 LAB — CBC WITH DIFFERENTIAL/PLATELET
Abs Immature Granulocytes: 0.07 10*3/uL (ref 0.00–0.07)
Basophils Absolute: 0.1 10*3/uL (ref 0.0–0.1)
Basophils Relative: 1 %
Eosinophils Absolute: 0.1 10*3/uL (ref 0.0–0.5)
Eosinophils Relative: 1 %
HCT: 33.6 % — ABNORMAL LOW (ref 36.0–46.0)
Hemoglobin: 11.7 g/dL — ABNORMAL LOW (ref 12.0–15.0)
Immature Granulocytes: 1 %
Lymphocytes Relative: 10 %
Lymphs Abs: 0.7 10*3/uL (ref 0.7–4.0)
MCH: 34.6 pg — ABNORMAL HIGH (ref 26.0–34.0)
MCHC: 34.8 g/dL (ref 30.0–36.0)
MCV: 99.4 fL (ref 80.0–100.0)
Monocytes Absolute: 1 10*3/uL (ref 0.1–1.0)
Monocytes Relative: 15 %
Neutro Abs: 4.8 10*3/uL (ref 1.7–7.7)
Neutrophils Relative %: 72 %
Platelets: 219 10*3/uL (ref 150–400)
RBC: 3.38 MIL/uL — ABNORMAL LOW (ref 3.87–5.11)
RDW: 12.7 % (ref 11.5–15.5)
WBC: 6.6 10*3/uL (ref 4.0–10.5)
nRBC: 0 % (ref 0.0–0.2)

## 2019-11-13 NOTE — TOC Transition Note (Signed)
Transition of Care [facility]) - CM/SW Discharge Note   Patient Details  Name: Yvette [name] MRN: [id number] Date of Birth: 02-23-1924  [facility] CM/SW Contact:  Lucy Chris, LCSW Phone Number: 11/13/2019, 2:00 PM   Clinical Narrative:   Pt has appealed the insurance for coverage to SNF. She has decided to pay privately for her and will transport her there once paperwork complete by bedside RN. Bedside RN to call report to [phone]. Have asked bedside RN to call daughter once ready to transfer to facility.    Final next level of care: Skilled Nursing Facility Barriers to Discharge: Barriers Resolved   Patient Goals and CMS Choice   CMS Medicare.gov Compare Post Acute Care list provided to:: Patient Choice offered to / list presented to : Patient  Discharge Placement PASRR number recieved: 11/10/19            Patient [facility] bed at: [facility] Patient to be transferred to facility by: Daughter Name of family member notified: daughter Patient and family notified of of transfer: 11/13/19  Discharge Plan and Services   Discharge Planning Services: CM Consult Post Acute Care Choice: Skilled Nursing Facility                               Social Determinants of Health (SDOH) Interventions     Readmission Risk Interventions No flowsheet data found.

## 2019-11-13 NOTE — Progress Notes (Signed)
Physical Therapy Treatment Patient Details Name: Yvette [name] MRN: [id number] DOB: March 30, 1924 Today's Date: 11/13/2019    History of Present Illness 86 y/o female admitted due to weakness, dizziness. Found to have low sodium. PMH includes OA, HTN    PT Comments    Pt resting in bed upon PT arrival.  Pt initially hesitant to get up d/t just receiving bath and getting Purewick placed and feeling worn out from these activities but then pt agreeable to PT session d/t pt stating she wanted to walk and she did not get a chance yesterday (although pt reporting she did ask staff to walk but they were too busy).  Pt modified independent with bed mobility; SBA with transfers; and SBA with ambulation 200 feet with RW.  Overall pt steady with functional mobility using RW and appearing stronger than last therapy session.  Pt reporting feeling good after walking with therapist and then tolerated LE ex's in bed.  Pt's HR around 75 bpm at rest and increased up to 110 bpm with ambulation (HR returned to mid 70's bpm with sitting rest).  Will continue to focus on strengthening, balance, and progressive functional mobility during hospitalization.   Follow Up Recommendations  Home health PT     Equipment Recommendations  Rolling walker with 5" wheels;3in1 (PT)(youth sized)    Recommendations for Other Services       Precautions / Restrictions Precautions Precautions: Fall Restrictions Weight Bearing Restrictions: No    Mobility  Bed Mobility Overal bed mobility: Modified Independent Bed Mobility: Supine to Sit;Sit to Supine     Supine to sit: Modified independent (Device/Increase time) Sit to supine: Modified independent (Device/Increase time)   General bed mobility comments: mild increased effort to perform on own  Transfers Overall transfer level: Needs assistance Equipment used: Rolling walker (2 wheeled) Transfers: Sit to/from Stand Sit to Stand: Supervision         General transfer  comment: mild increased effort to stand from bed up to RW; steady controlled descent sitting onto bed  Ambulation/Gait Ambulation/Gait assistance: Supervision Gait Distance (Feet): 200 Feet Assistive device: Rolling walker (2 wheeled) Gait Pattern/deviations: Step-through pattern;Decreased step length - right;Decreased step length - left Gait velocity: decreased   General Gait Details: steady with ambulation   Stairs             Wheelchair Mobility    Modified Rankin (Stroke Patients Only)       Balance Overall balance assessment: Needs assistance Sitting-balance support: No upper extremity supported;Feet supported Sitting balance-Leahy Scale: Normal Sitting balance - Comments: steady sitting reaching outside BOS   Standing balance support: No upper extremity supported Standing balance-Leahy Scale: Fair Standing balance comment: steady static standing                            Cognition Arousal/Alertness: Awake/alert Behavior During Therapy: WFL for tasks assessed/performed Overall Cognitive Status: Within Functional Limits for tasks assessed                                        Exercises General Exercises - Lower Extremity Ankle Circles/Pumps: AROM;Strengthening;Both;10 reps;Supine Short Arc Quad: AROM;Strengthening;Both;10 reps;Supine Long Arc Quad: AROM;Strengthening;Both;10 reps;Seated Heel Slides: AROM;Strengthening;Both;10 reps;Supine Hip ABduction/ADduction: AROM;Strengthening;Both;10 reps;Supine Hip Flexion/Marching: AROM;Strengthening;Both;10 reps;Seated    General Comments   Nursing cleared pt for participation in physical therapy.  Pt agreeable to PT session.  Pertinent Vitals/Pain Pain Assessment: No/denies pain  O2 sats WFL on room air during sessions activities.    Home Living                      Prior Function            PT Goals (current goals can now be found in the care plan section)  Acute Rehab PT Goals Patient Stated Goal: to improve strength and activity tolerance PT Goal Formulation: With patient Time For Goal Achievement: 11/19/19 Potential to Achieve Goals: Good Progress towards PT goals: Progressing toward goals    Frequency    Min 2X/week      PT Plan Current plan remains appropriate    Co-evaluation              AM-PAC PT "6 Clicks" Mobility   Outcome Measure  Help needed turning from your back to your side while in a flat bed without using bedrails?: None Help needed moving from lying on your back to sitting on the side of a flat bed without using bedrails?: None Help needed moving to and from a bed to a chair (including a wheelchair)?: A Little Help needed standing up from a chair using your arms (e.g., wheelchair or bedside chair)?: A Little Help needed to walk in hospital room?: A Little Help needed climbing 3-5 steps with a railing? : A Little 6 Click Score: 20    End of Session Equipment Utilized During Treatment: Gait belt Activity Tolerance: Patient tolerated treatment well Patient left: in bed;with call bell/phone within reach;with bed alarm set;Other (comment)(B heels floating via pillow) Nurse Communication: Mobility status;Precautions;Other (comment)(NT notified pt requesting Purewick to be replaced) PT Visit Diagnosis: Unsteadiness on feet (R26.81);Muscle weakness (generalized) (M62.81);Difficulty in walking, not elsewhere classified (R26.2)     Time: 1740-8144 PT Time Calculation (min) (ACUTE ONLY): 26 min  Charges:  $Therapeutic Exercise: 8-22 mins $Therapeutic Activity: 8-22 mins                     Leitha Bleak, PT 11/13/19, 2:32 PM

## 2019-11-13 NOTE — TOC Progression Note (Addendum)
Transition of Care [facility]) - Progression Note    Patient Details  Name: Yvette [name] MRN: [id number] Date of Birth: [birth year]  Transition of Care [facility]) CM/SW Contact  Olaoluwa Grieder, Lemar Livings, LCSW Phone Number: 11/13/2019, 8:17 AM  Clinical Narrative:   [facility] regarding authorization is still pending and has gone to the medical director for a peer to peer at 11:00 am today. Ref number [id number]. Will make daughter aware. According to [facility] health they have up to 72 hours to make decision.   8:36 am Peer to Peer gave message to Dr Marylu Lund to call [phone] option 5 Await response from MD    9:41 am Informed daughter of the need for peer to peer  Expected Discharge Plan: Skilled Nursing Facility Barriers to Discharge: Continued Medical Work up  Expected Discharge Plan and Services Expected Discharge Plan: Skilled Nursing Facility   Discharge Planning Services: CM Consult Post Acute Care Choice: Skilled Nursing Facility Living arrangements for the past 2 months: Apartment, Assisted Living Facility Expected Discharge Date: 11/12/19                                     Social Determinants of Health (SDOH) Interventions    Readmission Risk Interventions No flowsheet data found.

## 2019-11-13 NOTE — TOC Progression Note (Addendum)
Transition of Care [facility]) - Progression Note    Patient Details  Name: Yvette [name] MRN: [id number] Date of Birth: 1924/03/15  [facility]) CM/SW Contact  Tatem Fesler, Lemar Livings, LCSW Phone Number: 11/13/2019, 12:59 PM  Clinical Narrative:   Peer to peer was denied, daughter plans on appealing also, but wants to go ahead and get what Mom needs for home equipment and home health. Daughter just call and has decided to private pay at [facility] until appeal is heard 72 hr. Have asked MD to amend DC summary and can get her over to [facility]  Faxed clinicals for family appeal [phone] ref [id number]   Expected Discharge Plan: Skilled Nursing Facility Barriers to Discharge: Continued Medical Work up  Expected Discharge Plan and Services Expected Discharge Plan: Skilled Nursing Facility   Discharge Planning Services: CM Consult Post Acute Care Choice: Skilled Nursing Facility Living arrangements for the past 2 months: Apartment, Assisted Living Facility Expected Discharge Date: 11/12/19                                     Social Determinants of Health (SDOH) Interventions    Readmission Risk Interventions No flowsheet data found.

## 2019-11-13 NOTE — Progress Notes (Signed)
Yvette [name] WUJ:[id number] DOB: [birth year] DOA: 11/03/2019  PCP: Adin Hector, MD  Admit date: 11/03/2019 Discharge date: 11/13/2019  [facility]  Recommendations for Outpatient Follow-up:  1. Follow up with PCP in 1 week 2. Please obtain BMP/CBC in one week 3. Nephrology Dr. Juleen China or Holley Raring in one week     Discharge Condition:Stable CODE STATUS:full  Diet recommendation: Heart Healthy  Brief/Interim Summary: Yvette [name] is an 86 y.o. female with PMH significant for HTN, hyponatremia and atrial fibrillation on Eliquis who was well until this morning when she felt dizzy. The patient was seen in the ED and noted to have a sodium of 113.  Of note, she had been started on chlorthalidone by her PCP more than 6 weeks ago but patient is not sure how long ago for treatment of very difficult to control hypertension.  She was started on ivf for hydration. Then patient devenloped hypervolemia and was started on lasix. Her sodium level was 123. Then she was given Tolvaptan with improvement of Na 130. She feels better today. Denies any sx. She is ready for [facility].Her Chlorthialidone was discontinued. She was started on hydralazine. She will need dietary supplements such as Boost TID.  Discharge Diagnoses:  Principal Problem:   Hyponatremia Active Problems:   HLD (hyperlipidemia)   BP (high blood pressure)   Atrial fibrillation, chronic (HCC)    Discharge Instructions  Discharge Instructions    Diet - low sodium heart healthy   Complete by: As directed    Increase activity slowly   Complete by: As directed      Allergies as of 11/13/2019      Reactions   Metoprolol Swelling   In legs In legs   Amlodipine Other (See Comments)   edema   Hydrocodone-acetaminophen Nausea Only      Medication List    STOP taking these medications   chlorthalidone 25 MG tablet Commonly known as: HYGROTON     TAKE these medications    apixaban 2.5 MG Tabs tablet Commonly known as: ELIQUIS Take 1 tablet (2.5 mg total) by mouth 2 (two) times daily.   calcium citrate-vitamin D 315-200 MG-UNIT tablet Commonly known as: CITRACAL+D Take 1 tablet by mouth daily.   cloNIDine 0.1 MG tablet Commonly known as: CATAPRES Take 1 tablet (0.1 mg total) by mouth 2 (two) times daily. What changed: when to take this   hydrALAZINE 10 MG tablet Commonly known as: APRESOLINE Take 1 tablet (10 mg total) by mouth 3 (three) times daily.   losartan 100 MG tablet Commonly known as: COZAAR Take 100 mg by mouth daily.       Contact information for follow-up providers    Lavonia Dana, MD. Go on 11/18/2019.   Specialty: Nephrology Why: @10 :20 AM Contact information: [address] [place] [address] [phone]        Adin Hector, MD. Go on 11/19/2019.   Specialty: Internal Medicine Why: @10 :45 AM Contact information: [address] [place] [address] [phone]            Contact information for after-discharge care    Destination    [facility]   Service: Skilled Nursing Contact information: [address] [place] [phone]                 Allergies  Allergen Reactions  . Metoprolol  Swelling    In legs In legs   . Amlodipine Other (See Comments)    edema  . Hydrocodone-Acetaminophen Nausea Only    Consultations:  nephrology   Procedures/Studies: DG Chest Port 1 View  Result Date: 11/06/2019 CLINICAL DATA:  Shortness of breath EXAM: PORTABLE CHEST 1 VIEW COMPARISON:  09/18/2006 FINDINGS: Cardiac shadow is mildly enlarged. Aortic calcifications are seen. The lungs are well aerated bilaterally. Nipple shadows are noted. No acute bony abnormality is seen. IMPRESSION: No acute abnormality noted. Electronically Signed   By: Alcide Clever M.D.   On: 11/06/2019 19:00   DG Abd  Portable 1V  Result Date: 11/04/2019 CLINICAL DATA:  Nausea, vomiting. EXAM: PORTABLE ABDOMEN - 1 VIEW COMPARISON:  None. FINDINGS: The bowel gas pattern is normal. No radio-opaque calculi or other significant radiographic abnormality are seen. IMPRESSION: No evidence of bowel obstruction or ileus. Electronically Signed   By: Lupita Raider M.D.   On: 11/04/2019 15:09      Subjective: No complaints  Discharge Exam: Vitals:   11/12/19 2320 11/13/19 0909  BP: (!) 155/59 (!) 198/80  Pulse: 72 79  Resp: 18 17  Temp:  (!) 97.4 F (36.3 C)  SpO2: 98%    Vitals:   11/12/19 2300 11/12/19 2320 11/13/19 0500 11/13/19 0909  BP: (!) 182/93 (!) 155/59  (!) 198/80  Pulse: 61 72  79  Resp: 18 18  17   Temp: 98.3 F (36.8 C)   (!) 97.4 F (36.3 C)  TempSrc: Oral   Axillary  SpO2:  98%    Weight:   51 kg   Height:        General: Pt is alert, awake, not in acute distress Cardiovascular: RRR, S1/S2 +, no rubs, no gallops Respiratory: CTA bilaterally, no wheezing, no rhonchi Abdominal: Soft, NT, ND, bowel sounds + Extremities: trace pedal edema, no cyanosis    The results of significant diagnostics from this hospitalization (including imaging, microbiology, ancillary and laboratory) are listed below for reference.     Microbiology: Recent Results (from the past 240 hour(s))  SARS CORONAVIRUS 2 (TAT 6-24 HRS) Nasopharyngeal Nasopharyngeal Swab     Status: None   Collection Time: 11/03/19  3:20 PM   Specimen: Nasopharyngeal Swab  Result Value Ref Range Status   SARS Coronavirus 2 NEGATIVE NEGATIVE Final    Comment: (NOTE) SARS-CoV-2 target nucleic acids are NOT DETECTED. The SARS-CoV-2 RNA is generally detectable in upper and lower respiratory specimens during the acute phase of infection. Negative results do not preclude SARS-CoV-2 infection, do not rule out co-infections with other pathogens, and should not be used as the sole basis for treatment or other patient management  decisions. Negative results must be combined with clinical observations, patient history, and epidemiological information. The expected result is Negative. Fact Sheet for Patients: 01/03/20 Fact Sheet for Healthcare Providers: HairSlick.no This test is not yet approved or cleared by the [place]ir.com FDA and  has been authorized for detection and/or diagnosis of SARS-CoV-2 by FDA under an Emergency Use Authorization (EUA). This EUA will remain  in effect (meaning this test can be used) for the duration of the COVID-19 declaration under Section 56 4(b)(1) of the Act, 21 U.S.C. section 360bbb-3(b)(1), unless the authorization is terminated or revoked sooner. Performed at [facility], [address], [place], [place]   MRSA PCR Screening     Status: None   Collection Time: 11/03/19  9:00 PM   Specimen: Nasopharyngeal  Result Value  Ref Range Status   MRSA by PCR NEGATIVE NEGATIVE Final    Comment:        The GeneXpert MRSA Assay (FDA approved for NASAL specimens only), is one component of a comprehensive MRSA colonization surveillance program. It is not intended to diagnose MRSA infection nor to guide or monitor treatment for MRSA infections. Performed at [facility], [address] [place], [place] [address]   Respiratory Panel by RT PCR (Flu A&B, Covid) - Nasopharyngeal Swab     Status: None   Collection Time: 11/12/19 12:21 PM   Specimen: Nasopharyngeal Swab  Result Value Ref Range Status   SARS Coronavirus 2 by RT PCR NEGATIVE NEGATIVE Final    Comment: (NOTE) SARS-CoV-2 target nucleic acids are NOT DETECTED. The SARS-CoV-2 RNA is generally detectable in upper respiratoy specimens during the acute phase of infection. The lowest concentration of SARS-CoV-2 viral copies this assay can detect is 131 copies/mL. A negative result does not preclude SARS-Cov-2 infection and should not be  used as the sole basis for treatment or other patient management decisions. A negative result may occur with  improper specimen collection/handling, submission of specimen other than nasopharyngeal swab, presence of viral mutation(s) within the areas targeted by this assay, and inadequate number of viral copies (<131 copies/mL). A negative result must be combined with clinical observations, patient history, and epidemiological information. The expected result is Negative. Fact Sheet for Patients:  https://www.moore.com/https://www.fda.gov/media/142436/download Fact Sheet for Healthcare Providers:  https://www.young.biz/https://www.fda.gov/media/142435/download This test is not yet ap proved or cleared by the [place] FDA and  has been authorized for detection and/or diagnosis of SARS-CoV-2 by FDA under an Emergency Use Authorization (EUA). This EUA will remain  in effect (meaning this test can be used) for the duration of the COVID-19 declaration under Section 564(b)(1) of the Act, 21 U.S.C. section 360bbb-3(b)(1), unless the authorization is terminated or revoked sooner.    Influenza A by PCR NEGATIVE NEGATIVE Final   Influenza B by PCR NEGATIVE NEGATIVE Final    Comment: (NOTE) The Xpert Xpress SARS-CoV-2/FLU/RSV assay is intended as an aid in  the diagnosis of influenza from Nasopharyngeal swab specimens and  should not be used as a sole basis for treatment. Nasal washings and  aspirates are unacceptable for Xpert Xpress SARS-CoV-2/FLU/RSV  testing. Fact Sheet for Patients: https://www.moore.com/https://www.fda.gov/media/142436/download Fact Sheet for Healthcare Providers: https://www.young.biz/https://www.fda.gov/media/142435/download This test is not yet approved or cleared by the [place] FDA and  has been authorized for detection and/or diagnosis of SARS-CoV-2 by  FDA under an Emergency Use Authorization (EUA). This EUA will remain  in effect (meaning this test can be used) for the duration of the  Covid-19 declaration under Section 564(b)(1) of the  Act, 21  U.S.C. section 360bbb-3(b)(1), unless the authorization is  terminated or revoked. Performed at [facility], [address] [place], [place] [address]      Labs: BNP (last 3 results) No results for input(s): BNP in the last 8760 hours. Basic Metabolic Panel: Recent Labs  Lab 11/09/19 0406 11/09/19 0835 11/10/19 0515 11/10/19 0743 11/11/19 0430 11/11/19 0805 11/11/19 1218 11/11/19 1453 11/11/19 2238 11/12/19 0422 11/13/19 0438  NA 123*   < > 126*   < > 125*   < > 123* 123* 127* 130* 134*  K 3.6  --  3.3*  --  4.2  --   --   --   --  4.4 4.6  CL 91*  --  94*  --  91*  --   --   --   --  96* 99  CO2 22  --  24  --  25  --   --   --   --  26 26  GLUCOSE 108*  --  104*  --  102*  --   --   --   --  110* 104*  BUN 17  --  18  --  21  --   --   --   --  28* 28*  CREATININE 0.50  --  0.38*  --  0.50  --   --   --   --  0.64 0.56  CALCIUM 7.8*  --  7.9*  --  8.4*  --   --   --   --  8.4* 8.3*   < > = values in this interval not displayed.   Liver Function Tests: Recent Labs  Lab 11/09/19 0406 11/10/19 0515 11/11/19 0430 11/12/19 0422 11/13/19 0438  AST 26 23 20 20 20   ALT 17 17 18 16 17   ALKPHOS 50 49 55 47 54  BILITOT 1.2 1.3* 1.1 1.0 1.0  PROT 5.7* 5.7* 5.8* 5.7* 6.0*  ALBUMIN 3.0* 3.0* 3.0* 2.9* 3.1*   No results for input(s): LIPASE, AMYLASE in the last 168 hours. No results for input(s): AMMONIA in the last 168 hours. CBC: Recent Labs  Lab 11/09/19 0406 11/10/19 0515 11/11/19 0430 11/12/19 0422 11/13/19 0438  WBC 8.9 6.4 7.6 5.0 6.6  NEUTROABS 7.0 4.8 5.6 3.4 4.8  HGB 11.1* 11.9* 11.9* 11.0* 11.7*  HCT 31.3* 31.7* 33.9* 31.7* 33.6*  MCV 96.6 96.1 97.1 98.4 99.4  PLT 170 176 227 210 219   Cardiac Enzymes: No results for input(s): CKTOTAL, CKMB, CKMBINDEX, TROPONINI in the last 168 hours. BNP: Invalid input(s): POCBNP CBG: No results for input(s): GLUCAP in the last 168 hours. D-Dimer No results for input(s): DDIMER in the last  72 hours. Hgb A1c No results for input(s): HGBA1C in the last 72 hours. Lipid Profile No results for input(s): CHOL, HDL, LDLCALC, TRIG, CHOLHDL, LDLDIRECT in the last 72 hours. Thyroid function studies No results for input(s): TSH, T4TOTAL, T3FREE, THYROIDAB in the last 72 hours.  Invalid input(s): FREET3 Anemia work up No results for input(s): VITAMINB12, FOLATE, FERRITIN, TIBC, IRON, RETICCTPCT in the last 72 hours. Urinalysis    Component Value Date/Time   COLORURINE YELLOW (A) 11/03/2019 1424   APPEARANCEUR CLEAR (A) 11/03/2019 1424   LABSPEC 1.008 11/03/2019 1424   PHURINE 7.0 11/03/2019 1424   GLUCOSEU NEGATIVE 11/03/2019 1424   HGBUR NEGATIVE 11/03/2019 1424   BILIRUBINUR NEGATIVE 11/03/2019 1424   KETONESUR 5 (A) 11/03/2019 1424   PROTEINUR NEGATIVE 11/03/2019 1424   NITRITE NEGATIVE 11/03/2019 1424   LEUKOCYTESUR NEGATIVE 11/03/2019 1424   Sepsis Labs Invalid input(s): PROCALCITONIN,  WBC,  LACTICIDVEN Microbiology Recent Results (from the past 240 hour(s))  SARS CORONAVIRUS 2 (TAT 6-24 HRS) Nasopharyngeal Nasopharyngeal Swab     Status: None   Collection Time: 11/03/19  3:20 PM   Specimen: Nasopharyngeal Swab  Result Value Ref Range Status   SARS Coronavirus 2 NEGATIVE NEGATIVE Final    Comment: (NOTE) SARS-CoV-2 target nucleic acids are NOT DETECTED. The SARS-CoV-2 RNA is generally detectable in upper and lower respiratory specimens during the acute phase of infection. Negative results do not preclude SARS-CoV-2 infection, do not rule out co-infections with other pathogens, and should not be used as the sole basis for treatment or other patient management decisions. Negative results must be combined with clinical observations, patient history, and  epidemiological information. The expected result is Negative. Fact Sheet for Patients: HairSlick.no Fact Sheet for Healthcare Providers: quierodirigir.com This  test is not yet approved or cleared by the [place] FDA and  has been authorized for detection and/or diagnosis of SARS-CoV-2 by FDA under an Emergency Use Authorization (EUA). This EUA will remain  in effect (meaning this test can be used) for the duration of the COVID-19 declaration under Section 56 4(b)(1) of the Act, 21 U.S.C. section 360bbb-3(b)(1), unless the authorization is terminated or revoked sooner. Performed at [facility], [address] [place], [place] [address]   MRSA PCR Screening     Status: None   Collection Time: 11/03/19  9:00 PM   Specimen: Nasopharyngeal  Result Value Ref Range Status   MRSA by PCR NEGATIVE NEGATIVE Final    Comment:        The GeneXpert MRSA Assay (FDA approved for NASAL specimens only), is one component of a comprehensive MRSA colonization surveillance program. It is not intended to diagnose MRSA infection nor to guide or monitor treatment for MRSA infections. Performed at [facility], [address] [place], [place] [address]   Respiratory Panel by RT PCR (Flu A&B, Covid) - Nasopharyngeal Swab     Status: None   Collection Time: 11/12/19 12:21 PM   Specimen: Nasopharyngeal Swab  Result Value Ref Range Status   SARS Coronavirus 2 by RT PCR NEGATIVE NEGATIVE Final    Comment: (NOTE) SARS-CoV-2 target nucleic acids are NOT DETECTED. The SARS-CoV-2 RNA is generally detectable in upper respiratoy specimens during the acute phase of infection. The lowest concentration of SARS-CoV-2 viral copies this assay can detect is 131 copies/mL. A negative result does not preclude SARS-Cov-2 infection and should not be used as the sole basis for treatment or other patient management decisions. A negative result may occur with  improper specimen collection/handling, submission of specimen other than nasopharyngeal swab, presence of viral mutation(s) within the areas targeted by this assay, and inadequate number of viral  copies (<131 copies/mL). A negative result must be combined with clinical observations, patient history, and epidemiological information. The expected result is Negative. Fact Sheet for Patients:  https://www.moore.com/ Fact Sheet for Healthcare Providers:  https://www.young.biz/ This test is not yet ap proved or cleared by the [place] FDA and  has been authorized for detection and/or diagnosis of SARS-CoV-2 by FDA under an Emergency Use Authorization (EUA). This EUA will remain  in effect (meaning this test can be used) for the duration of the COVID-19 declaration under Section 564(b)(1) of the Act, 21 U.S.C. section 360bbb-3(b)(1), unless the authorization is terminated or revoked sooner.    Influenza A by PCR NEGATIVE NEGATIVE Final   Influenza B by PCR NEGATIVE NEGATIVE Final    Comment: (NOTE) The Xpert Xpress SARS-CoV-2/FLU/RSV assay is intended as an aid in  the diagnosis of influenza from Nasopharyngeal swab specimens and  should not be used as a sole basis for treatment. Nasal washings and  aspirates are unacceptable for Xpert Xpress SARS-CoV-2/FLU/RSV  testing. Fact Sheet for Patients: https://www.moore.com/ Fact Sheet for Healthcare Providers: https://www.young.biz/ This test is not yet approved or cleared by the [place] FDA and  has been authorized for detection and/or diagnosis of SARS-CoV-2 by  FDA under an Emergency Use Authorization (EUA). This EUA will remain  in effect (meaning this test can be used) for the duration of the  Covid-19 declaration under Section 564(b)(1) of the Act, 21  U.S.C. section 360bbb-3(b)(1), unless  the authorization is  terminated or revoked. Performed at [facility], [address] [place], [place] [address]      Time coordinating discharge: Over 30 minutes  SIGNED:   Lynn Ito, MD  [facility] 11/13/2019, 2:02 PM Pager    If 7PM-7AM, please contact night-coverage [email or web address] Password [email or web address] ID: Shaune Pollack, female   DOB: [birth year], 86 y.o.   MRN: [id number]

## 2019-11-13 NOTE — Progress Notes (Signed)
[facility]  ROUNDING NOTE   Subjective:   Furosemide PO 20mg  daily  Na 134  Objective:  Vital signs in last 24 hours:  Temp:  [97.4 F (36.3 C)-98.3 F (36.8 C)] 97.4 F (36.3 C) (03/19 0909) Pulse Rate:  [61-79] 79 (03/19 0909) Resp:  [17-21] 17 (03/19 0909) BP: (155-198)/(59-93) 198/80 (03/19 0909) SpO2:  [98 %-100 %] 98 % (03/18 2320) Weight:  [51 kg] 51 kg (03/19 0500)  Weight change:  Filed Weights   11/03/19 1351 11/04/19 1615 11/13/19 0500  Weight: 49.9 kg 45 kg 51 kg    Intake/Output: I/O last 3 completed shifts: In: 1077 [P.O.:1077] Out: 2200 [Urine:2200]   Intake/Output this shift:  Total I/O In: 240 [P.O.:240] Out: -   Physical Exam: General: No acute distress, sitting in chair  Head: Normocephalic, atraumatic. Moist oral mucosal membranes  Eyes: Anicteric  Neck: Supple, trachea midline  Lungs:  Clear to auscultation, normal effort  Heart: irregular  Abdomen:  Soft, nontender, bowel sounds present  Extremities: trace edema  Neurologic: Awake, alert, following commands  Skin: No lesions        Basic Metabolic Panel: Recent Labs  Lab 11/09/19 0406 11/09/19 0835 11/10/19 0515 11/10/19 0743 11/11/19 0430 11/11/19 0805 11/11/19 1218 11/11/19 1453 11/11/19 2238 11/12/19 0422 11/13/19 0438  NA 123*   < > 126*   < > 125*   < > 123* 123* 127* 130* 134*  K 3.6  --  3.3*  --  4.2  --   --   --   --  4.4 4.6  CL 91*  --  94*  --  91*  --   --   --   --  96* 99  CO2 22  --  24  --  25  --   --   --   --  26 26  GLUCOSE 108*  --  104*  --  102*  --   --   --   --  110* 104*  BUN 17  --  18  --  21  --   --   --   --  28* 28*  CREATININE 0.50  --  0.38*  --  0.50  --   --   --   --  0.64 0.56  CALCIUM 7.8*  --  7.9*   < > 8.4*  --   --   --   --  8.4* 8.3*   < > = values in this interval not displayed.    Liver Function Tests: Recent Labs  Lab 11/09/19 0406 11/10/19 0515 11/11/19 0430 11/12/19 0422 11/13/19 0438  AST 26 23  20 20 20   ALT 17 17 18 16 17   ALKPHOS 50 49 55 47 54  BILITOT 1.2 1.3* 1.1 1.0 1.0  PROT 5.7* 5.7* 5.8* 5.7* 6.0*  ALBUMIN 3.0* 3.0* 3.0* 2.9* 3.1*   No results for input(s): LIPASE, AMYLASE in the last 168 hours. No results for input(s): AMMONIA in the last 168 hours.  CBC: Recent Labs  Lab 11/09/19 0406 11/10/19 0515 11/11/19 0430 11/12/19 0422 11/13/19 0438  WBC 8.9 6.4 7.6 5.0 6.6  NEUTROABS 7.0 4.8 5.6 3.4 4.8  HGB 11.1* 11.9* 11.9* 11.0* 11.7*  HCT 31.3* 31.7* 33.9* 31.7* 33.6*  MCV 96.6 96.1 97.1 98.4 99.4  PLT 170 176 227 210 219    Cardiac Enzymes: No results for input(s): CKTOTAL, CKMB, CKMBINDEX, TROPONINI in the last 168 hours.  BNP: Invalid input(s): POCBNP  CBG: No results for input(s): GLUCAP in the last 168 hours.  Microbiology: Results for orders placed or performed during the hospital encounter of 11/03/19  SARS CORONAVIRUS 2 (TAT 6-24 HRS) Nasopharyngeal Nasopharyngeal Swab     Status: None   Collection Time: 11/03/19  3:20 PM   Specimen: Nasopharyngeal Swab  Result Value Ref Range Status   SARS Coronavirus 2 NEGATIVE NEGATIVE Final    Comment: (NOTE) SARS-CoV-2 target nucleic acids are NOT DETECTED. The SARS-CoV-2 RNA is generally detectable in upper and lower respiratory specimens during the acute phase of infection. Negative results do not preclude SARS-CoV-2 infection, do not rule out co-infections with other pathogens, and should not be used as the sole basis for treatment or other patient management decisions. Negative results must be combined with clinical observations, patient history, and epidemiological information. The expected result is Negative. Fact Sheet for Patients: HairSlick.no Fact Sheet for Healthcare Providers: quierodirigir.com This test is not yet approved or cleared by the [place] FDA and  has been authorized for detection and/or diagnosis of SARS-CoV-2  by FDA under an Emergency Use Authorization (EUA). This EUA will remain  in effect (meaning this test can be used) for the duration of the COVID-19 declaration under Section 56 4(b)(1) of the Act, 21 U.S.C. section 360bbb-3(b)(1), unless the authorization is terminated or revoked sooner. Performed at [facility], [address] [place], [place] [address]   MRSA PCR Screening     Status: None   Collection Time: 11/03/19  9:00 PM   Specimen: Nasopharyngeal  Result Value Ref Range Status   MRSA by PCR NEGATIVE NEGATIVE Final    Comment:        The GeneXpert MRSA Assay (FDA approved for NASAL specimens only), is one component of a comprehensive MRSA colonization surveillance program. It is not intended to diagnose MRSA infection nor to guide or monitor treatment for MRSA infections. Performed at [facility], [address] [place], [place] [address]   Respiratory Panel by RT PCR (Flu A&B, Covid) - Nasopharyngeal Swab     Status: None   Collection Time: 11/12/19 12:21 PM   Specimen: Nasopharyngeal Swab  Result Value Ref Range Status   SARS Coronavirus 2 by RT PCR NEGATIVE NEGATIVE Final    Comment: (NOTE) SARS-CoV-2 target nucleic acids are NOT DETECTED. The SARS-CoV-2 RNA is generally detectable in upper respiratoy specimens during the acute phase of infection. The lowest concentration of SARS-CoV-2 viral copies this assay can detect is 131 copies/mL. A negative result does not preclude SARS-Cov-2 infection and should not be used as the sole basis for treatment or other patient management decisions. A negative result may occur with  improper specimen collection/handling, submission of specimen other than nasopharyngeal swab, presence of viral mutation(s) within the areas targeted by this assay, and inadequate number of viral copies (<131 copies/mL). A negative result must be combined with clinical observations, patient history, and epidemiological  information. The expected result is Negative. Fact Sheet for Patients:  https://www.moore.com/ Fact Sheet for Healthcare Providers:  https://www.young.biz/ This test is not yet ap proved or cleared by the [place] FDA and  has been authorized for detection and/or diagnosis of SARS-CoV-2 by FDA under an Emergency Use Authorization (EUA). This EUA will remain  in effect (meaning this test can be used) for the duration of the COVID-19 declaration under Section 564(b)(1) of the Act, 21 U.S.C. section 360bbb-3(b)(1), unless the authorization is terminated or revoked sooner.    Influenza A by  PCR NEGATIVE NEGATIVE Final   Influenza B by PCR NEGATIVE NEGATIVE Final    Comment: (NOTE) The Xpert Xpress SARS-CoV-2/FLU/RSV assay is intended as an aid in  the diagnosis of influenza from Nasopharyngeal swab specimens and  should not be used as a sole basis for treatment. Nasal washings and  aspirates are unacceptable for Xpert Xpress SARS-CoV-2/FLU/RSV  testing. Fact Sheet for Patients: PinkCheek.be Fact Sheet for Healthcare Providers: GravelBags.it This test is not yet approved or cleared by the [place] FDA and  has been authorized for detection and/or diagnosis of SARS-CoV-2 by  FDA under an Emergency Use Authorization (EUA). This EUA will remain  in effect (meaning this test can be used) for the duration of the  Covid-19 declaration under Section 564(b)(1) of the Act, 21  U.S.C. section 360bbb-3(b)(1), unless the authorization is  terminated or revoked. Performed at [facility], [address] [place], [place] [address]     Coagulation Studies: No results for input(s): LABPROT, INR in the last 72 hours.  Urinalysis: No results for input(s): COLORURINE, LABSPEC, PHURINE, GLUCOSEU, HGBUR, BILIRUBINUR, KETONESUR, PROTEINUR, UROBILINOGEN, NITRITE, LEUKOCYTESUR in the last  72 hours.  Invalid input(s): APPERANCEUR    Imaging: No results found.   Medications:    . apixaban  2.5 mg Oral BID  . cloNIDine  0.1 mg Oral BID  . feeding supplement  1 Container Oral TID BM  . feeding supplement (PRO-STAT SUGAR FREE 64)  30 mL Oral BID  . furosemide  20 mg Oral Daily  . hydrALAZINE  10 mg Oral TID  . losartan  100 mg Oral Daily  . polyethylene glycol  17 g Oral Daily  . potassium chloride  20 mEq Oral Daily  . senna-docusate  1 tablet Oral BID  . sodium chloride flush  3 mL Intravenous Once   acetaminophen, ondansetron (ZOFRAN) IV  Assessment/ Plan:  86 y.o. female with a PMHx of hypertension, atrial fibrillation, prior episode of hyponatremia, cystocele, osteoporosis who was admitted to [facility] on 11/03/2019 for evaluation of severe hyponatremia.  1.  Hyponatremia.  Suspect secondary to use of chlorthalidone. Sodium 113 on admission.    Baseline sodium of 130 on  10/13/2019 Status post tolvaptan on 3/17.   2.  Hypertension.  Blood pressure has been labile.  With atrial fibrillation Current regimen of clonidine, hydralazine, and losartan.   - PO furosemide.   3. Hypokalemia: secondary to furosemide.  - PO potassium chloride.    LOS: 10 Yvette [name] 3/19/20211:53 PM

## 2019-11-23 ENCOUNTER — Encounter: Payer: Self-pay | Admitting: Emergency Medicine

## 2019-11-23 ENCOUNTER — Other Ambulatory Visit: Payer: Self-pay

## 2019-11-23 ENCOUNTER — Emergency Department: Payer: Medicare Other

## 2019-11-23 ENCOUNTER — Emergency Department
Admission: EM | Admit: 2019-11-23 | Discharge: 2019-11-23 | Disposition: A | Discharge status: Skilled Nursing Facility | Payer: Medicare Other | Attending: Emergency Medicine | Admitting: Emergency Medicine

## 2019-11-23 DIAGNOSIS — I482 Chronic atrial fibrillation, unspecified: Secondary | ICD-10-CM | POA: Diagnosis not present

## 2019-11-23 DIAGNOSIS — Z79899 Other long term (current) drug therapy: Secondary | ICD-10-CM | POA: Insufficient documentation

## 2019-11-23 DIAGNOSIS — N3000 Acute cystitis without hematuria: Secondary | ICD-10-CM | POA: Diagnosis not present

## 2019-11-23 DIAGNOSIS — R531 Weakness: Secondary | ICD-10-CM

## 2019-11-23 DIAGNOSIS — Z7901 Long term (current) use of anticoagulants: Secondary | ICD-10-CM | POA: Insufficient documentation

## 2019-11-23 DIAGNOSIS — R509 Fever, unspecified: Secondary | ICD-10-CM | POA: Diagnosis present

## 2019-11-23 DIAGNOSIS — M6281 Muscle weakness (generalized): Secondary | ICD-10-CM | POA: Insufficient documentation

## 2019-11-23 DIAGNOSIS — I10 Essential (primary) hypertension: Secondary | ICD-10-CM | POA: Insufficient documentation

## 2019-11-23 LAB — COMPREHENSIVE METABOLIC PANEL
ALT: 35 U/L (ref 0–44)
AST: 40 U/L (ref 15–41)
Albumin: 3.1 g/dL — ABNORMAL LOW (ref 3.5–5.0)
Alkaline Phosphatase: 125 U/L (ref 38–126)
Anion gap: 11 (ref 5–15)
BUN: 17 mg/dL (ref 8–23)
CO2: 23 mmol/L (ref 22–32)
Calcium: 8 mg/dL — ABNORMAL LOW (ref 8.9–10.3)
Chloride: 99 mmol/L (ref 98–111)
Creatinine, Ser: 0.54 mg/dL (ref 0.44–1.00)
GFR calc Af Amer: >60 mL/min (ref >60)
GFR calc non Af Amer: >60 mL/min (ref >60)
Glucose, Bld: 112 mg/dL — ABNORMAL HIGH (ref 70–99)
Potassium: 2.9 mmol/L — ABNORMAL LOW (ref 3.5–5.1)
Sodium: 133 mmol/L — ABNORMAL LOW (ref 135–145)
Total Bilirubin: 1 mg/dL (ref 0.3–1.2)
Total Protein: 6.3 g/dL — ABNORMAL LOW (ref 6.5–8.1)

## 2019-11-23 LAB — CBC WITH DIFFERENTIAL/PLATELET
Abs Immature Granulocytes: 0.09 10*3/uL — ABNORMAL HIGH (ref 0.00–0.07)
Basophils Absolute: 0 10*3/uL (ref 0.0–0.1)
Basophils Relative: 0 %
Eosinophils Absolute: 0.1 10*3/uL (ref 0.0–0.5)
Eosinophils Relative: 2 %
HCT: 27.9 % — ABNORMAL LOW (ref 36.0–46.0)
Hemoglobin: 9.7 g/dL — ABNORMAL LOW (ref 12.0–15.0)
Immature Granulocytes: 1 %
Lymphocytes Relative: 4 %
Lymphs Abs: 0.3 10*3/uL — ABNORMAL LOW (ref 0.7–4.0)
MCH: 33.9 pg (ref 26.0–34.0)
MCHC: 34.8 g/dL (ref 30.0–36.0)
MCV: 97.6 fL (ref 80.0–100.0)
Monocytes Absolute: 0.4 10*3/uL (ref 0.1–1.0)
Monocytes Relative: 5 %
Neutro Abs: 7.8 10*3/uL — ABNORMAL HIGH (ref 1.7–7.7)
Neutrophils Relative %: 88 %
Platelets: 195 10*3/uL (ref 150–400)
RBC: 2.86 MIL/uL — ABNORMAL LOW (ref 3.87–5.11)
RDW: 13.2 % (ref 11.5–15.5)
WBC: 8.8 10*3/uL (ref 4.0–10.5)
nRBC: 0 % (ref 0.0–0.2)

## 2019-11-23 LAB — URINALYSIS, COMPLETE (UACMP) WITH MICROSCOPIC
Bilirubin Urine: NEGATIVE
Glucose, UA: NEGATIVE mg/dL
Hgb urine dipstick: NEGATIVE
Ketones, ur: NEGATIVE mg/dL
Nitrite: POSITIVE — AB
Protein, ur: 30 mg/dL — AB
Specific Gravity, Urine: 1.017 (ref 1.005–1.030)
pH: 6 (ref 5.0–8.0)

## 2019-11-23 LAB — LACTIC ACID, PLASMA: Lactic Acid, Venous: 1.3 mmol/L (ref 0.5–1.9)

## 2019-11-23 MED ORDER — SODIUM CHLORIDE 0.9 % IV SOLN
1.0000 g | Freq: Once | INTRAVENOUS | Status: AC
  Administered 2019-11-23: 1 g via INTRAVENOUS
  Filled 2019-11-23: qty 10

## 2019-11-23 MED ORDER — POTASSIUM CHLORIDE CRYS ER 20 MEQ PO TBCR
40.0000 meq | EXTENDED_RELEASE_TABLET | Freq: Once | ORAL | Status: AC
  Administered 2019-11-23: 40 meq via ORAL
  Filled 2019-11-23: qty 2

## 2019-11-23 MED ORDER — CEPHALEXIN 500 MG PO CAPS: 500.0000 mg | ORAL_CAPSULE | Freq: Two times a day (BID) | ORAL | 0 refills | Status: AC

## 2019-11-23 NOTE — ED Triage Notes (Signed)
Sent from [facility] for elevated HR after ambulating to restroom. Pt HR WNL at this time. Pt c/o malaise for past week but denies any pain.  Has no other complaints other than "feeling bad".  Unlabored. Febrile in triage.  No cough. No covid exposure known.

## 2019-11-23 NOTE — ED Notes (Signed)
Pt reports feeling bad today.  Pt was brought in via ems from [facility].  Iv in place.  Denies chest pain or sob at this time.  No n/v/d.  Pt alert.

## 2019-11-23 NOTE — ED Notes (Signed)
This rn updated daughter [place] via phone.

## 2019-11-23 NOTE — ED Provider Notes (Signed)
[facility] Emergency Department Provider Note   ____________________________________________   First MD Initiated Contact with Patient 11/23/19 1830     (approximate)  I have reviewed the triage vital signs and the nursing notes.   HISTORY  Chief Complaint Fever    HPI Yvette [name] is a 86 y.o. female with past medical history of anemia, chronic atrial fibrillation on Eliquis, and hyponatremia presents to the ED for malaise.  Patient was sent from [facility] for evaluation after she was noted to have a high heart rate when ambulating.  There was concern for orthostasis given drop in her blood pressure when moving from sitting to standing position.  Patient reports that she was feeling bad earlier today, is unable to expand on exactly how she was feeling bad, but states she feels better now.  She states she felt short of breath earlier, but this is since resolved and she denies any chest pain or cough.  She denies any nausea, vomiting, diarrhea, abdominal pain, dysuria, or hematuria.        Past Medical History:  Diagnosis Date  . Arthritis   . Cystocele    midline  . Hypertension   . Low sodium levels   . Osteoporosis   . Procidentia of uterus     Patient Active Problem List   Diagnosis Date Noted  . Atrial fibrillation, chronic (HCC) 11/03/2019  . Grief 06/20/2016  . Hyponatremia 04/29/2016  . UTI (lower urinary tract infection) 04/29/2016  . 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 Cystocele 03/25/2015  . Absolute anemia 12/14/2014  . Arthritis 12/14/2014  . HLD (hyperlipidemia) 12/14/2014  . BP (high blood pressure) 12/14/2014  . Back ache 04/20/2014    Past Surgical History:  Procedure Laterality Date  . NO PAST SURGERIES      Prior to Admission medications   Medication Sig Start Date End Date Taking? Authorizing Provider  apixaban (ELIQUIS) 2.5 MG TABS tablet Take 1 tablet (2.5 mg  total) by mouth 2 (two) times daily. 05/03/16  Yes Enid Baas, MD  calcium citrate-vitamin D (CITRACAL+D) 315-200 MG-UNIT tablet Take 1 tablet by mouth daily.    Yes [provider]  cloNIDine (CATAPRES) 0.1 MG tablet Take 1 tablet (0.1 mg total) by mouth 2 (two) times daily. 11/12/19  Yes Lynn Ito, MD  hydrALAZINE (APRESOLINE) 10 MG tablet Take 1 tablet (10 mg total) by mouth 3 (three) times daily. 11/12/19  Yes Lynn Ito, MD  losartan (COZAAR) 100 MG tablet Take 100 mg by mouth daily.   Yes [provider]  cephALEXin (KEFLEX) 500 MG capsule Take 1 capsule (500 mg total) by mouth 2 (two) times daily for 7 days. 11/23/19 11/30/19  Chesley Noon, MD    Allergies Metoprolol, Amlodipine, and Hydrocodone-acetaminophen  Family History  Problem Relation Age of Onset  . Breast cancer Maternal Aunt   . Breast cancer Paternal Aunt   . Ovarian cancer Neg Hx   . Diabetes Neg Hx     Social History Social History   Tobacco Use  . Smoking status: Never Smoker  . Smokeless tobacco: Never Used  Substance Use Topics  . Alcohol use: No  . Drug use: No    Review of Systems  Constitutional: No fever/chills.  Positive for malaise. Eyes: No visual changes. ENT: No sore throat. Cardiovascular: Denies chest pain. Respiratory: Positive for shortness of breath. Gastrointestinal: No abdominal pain.  No nausea, no vomiting.  No diarrhea.  No  constipation. Genitourinary: Negative for dysuria. Musculoskeletal: Negative for back pain. Skin: Negative for rash. Neurological: Negative for headaches, focal weakness or numbness.  ____________________________________________   PHYSICAL EXAM:  VITAL SIGNS: ED Triage Vitals [11/23/19 1609]  Enc Vitals Group     BP (!) 118/57     Pulse Rate 89     Resp 16     Temp (!) 100.4 F (38 C)     Temp Source Oral     SpO2 97 %     Weight 110 lb (49.9 kg)     Height 4\' 11"  (1.499 m)     Head Circumference      Peak Flow       Pain Score 0     Pain Loc      Pain Edu?      Excl. in [place]?     Constitutional: Alert and oriented. Eyes: Conjunctivae are normal. Head: Atraumatic. Nose: No congestion/rhinnorhea. Mouth/Throat: Mucous membranes are moist. Neck: Normal ROM Cardiovascular: Normal rate, irregularly irregular rhythm. Grossly normal heart sounds. Respiratory: Normal respiratory effort.  No retractions. Lungs CTAB. Gastrointestinal: Soft and nontender. No distention. Genitourinary: deferred Musculoskeletal: No lower extremity tenderness nor edema. Neurologic:  Normal speech and language. No gross focal neurologic deficits are appreciated. Skin:  Skin is warm, dry and intact. No rash noted. Psychiatric: Mood and affect are normal. Speech and behavior are normal.  ____________________________________________   LABS (all labs ordered are listed, but only abnormal results are displayed)  Labs Reviewed  COMPREHENSIVE METABOLIC PANEL - Abnormal; Notable for the following components:      Result Value   Sodium 133 (*)    Potassium 2.9 (*)    Glucose, Bld 112 (*)    Calcium 8.0 (*)    Total Protein 6.3 (*)    Albumin 3.1 (*)    All other components within normal limits  CBC WITH DIFFERENTIAL/PLATELET - Abnormal; Notable for the following components:   RBC 2.86 (*)    Hemoglobin 9.7 (*)    HCT 27.9 (*)    Neutro Abs 7.8 (*)    Lymphs Abs 0.3 (*)    Abs Immature Granulocytes 0.09 (*)    All other components within normal limits  URINALYSIS, COMPLETE (UACMP) WITH MICROSCOPIC - Abnormal; Notable for the following components:   Color, Urine YELLOW (*)    APPearance HAZY (*)    Protein, ur 30 (*)    Nitrite POSITIVE (*)    Leukocytes,Ua TRACE (*)    Bacteria, UA MANY (*)    All other components within normal limits  URINE CULTURE  LACTIC ACID, PLASMA   ____________________________________________  EKG  ED ECG REPORT I, Blake Divine, the attending physician, personally viewed and interpreted  this ECG.   Date: 11/23/2019  EKG Time: 16:21  Rate: 86  Rhythm: atrial fibrillation, rate 86  Axis: Normal  Intervals:none  ST&T Change: Nonspecific ST and T changes   PROCEDURES  Procedure(s) performed (including Critical Care):  Procedures   ____________________________________________   INITIAL IMPRESSION / ASSESSMENT AND PLAN / ED COURSE       86 year old female with history of anemia, chronic atrial fibrillation, and hyponatremia presents to the ED after episode of tachycardia when ambulating to the bathroom.  She reports feeling malaise with some shortness of breath earlier, but now states she feels much better and back to normal.  She was found to be febrile in triage, however this seems to have resolved without intervention and the remainder of her vital  signs are unremarkable.  She is in rate controlled atrial fibrillation without any acute ischemic changes and has stable blood pressure.  We will reassess orthostatic vital signs and check for any changes when patient stands or attempts to use walker.  Lab work shows very mild hyponatremia, improved from prior.  No leukocytosis or lactic acidosis and at this point I doubt sepsis.  Chest x-ray does show questionable pneumonia, UA is pending.  Patient able to tolerate standing up without difficulty, no drop in blood pressure or tachycardia noted.  At this point, I doubt pneumonia given patient has had no cough, chest pain, or shortness of breath.  Her generalized weakness seems more likely to be related to a UTI.  She was given initial dose of Rocephin and is appropriate for discharge home at this time.  We will treat with Keflex and culture urine, she was counseled to follow-up with her PCP and otherwise return to the ED for new or worsening symptoms.  Patient agrees with plan.      ____________________________________________   FINAL CLINICAL IMPRESSION(S) / ED DIAGNOSES  Final diagnoses:  Acute cystitis without  hematuria  Generalized weakness     ED Discharge Orders         Ordered    cephALEXin (KEFLEX) 500 MG capsule  2 times daily     11/23/19 2208           Note:  This document was prepared using Dragon voice recognition software and may include unintentional dictation errors.   Chesley Noon, MD 11/23/19 2209

## 2019-11-23 NOTE — ED Notes (Signed)
Pt alert   meds given.

## 2019-11-23 NOTE — ED Triage Notes (Signed)
From EMS with + orthostatics.  HR 170 per EMS when ambulating.  From [facility], they called for high HR when going to bathroom.  Hx hyponatremia.  Hx afib, fib with EMS.  20 pt loss in systolic sitting to standing.  Alert and oriented.

## 2019-11-25 LAB — URINE CULTURE: Culture: >=100000 — AB

## 2020-12-19 IMAGING — CR DG CHEST 2V
1 series · 2 of 2 positions shown · non-contrast
Comparison: 11/06/2019

CLINICAL DATA: Fever

EXAM:
CHEST - 2 VIEW

[Series 1: x chest ap · 0.14mm/px · 2 of 2 slices shown]
[im 1/2]
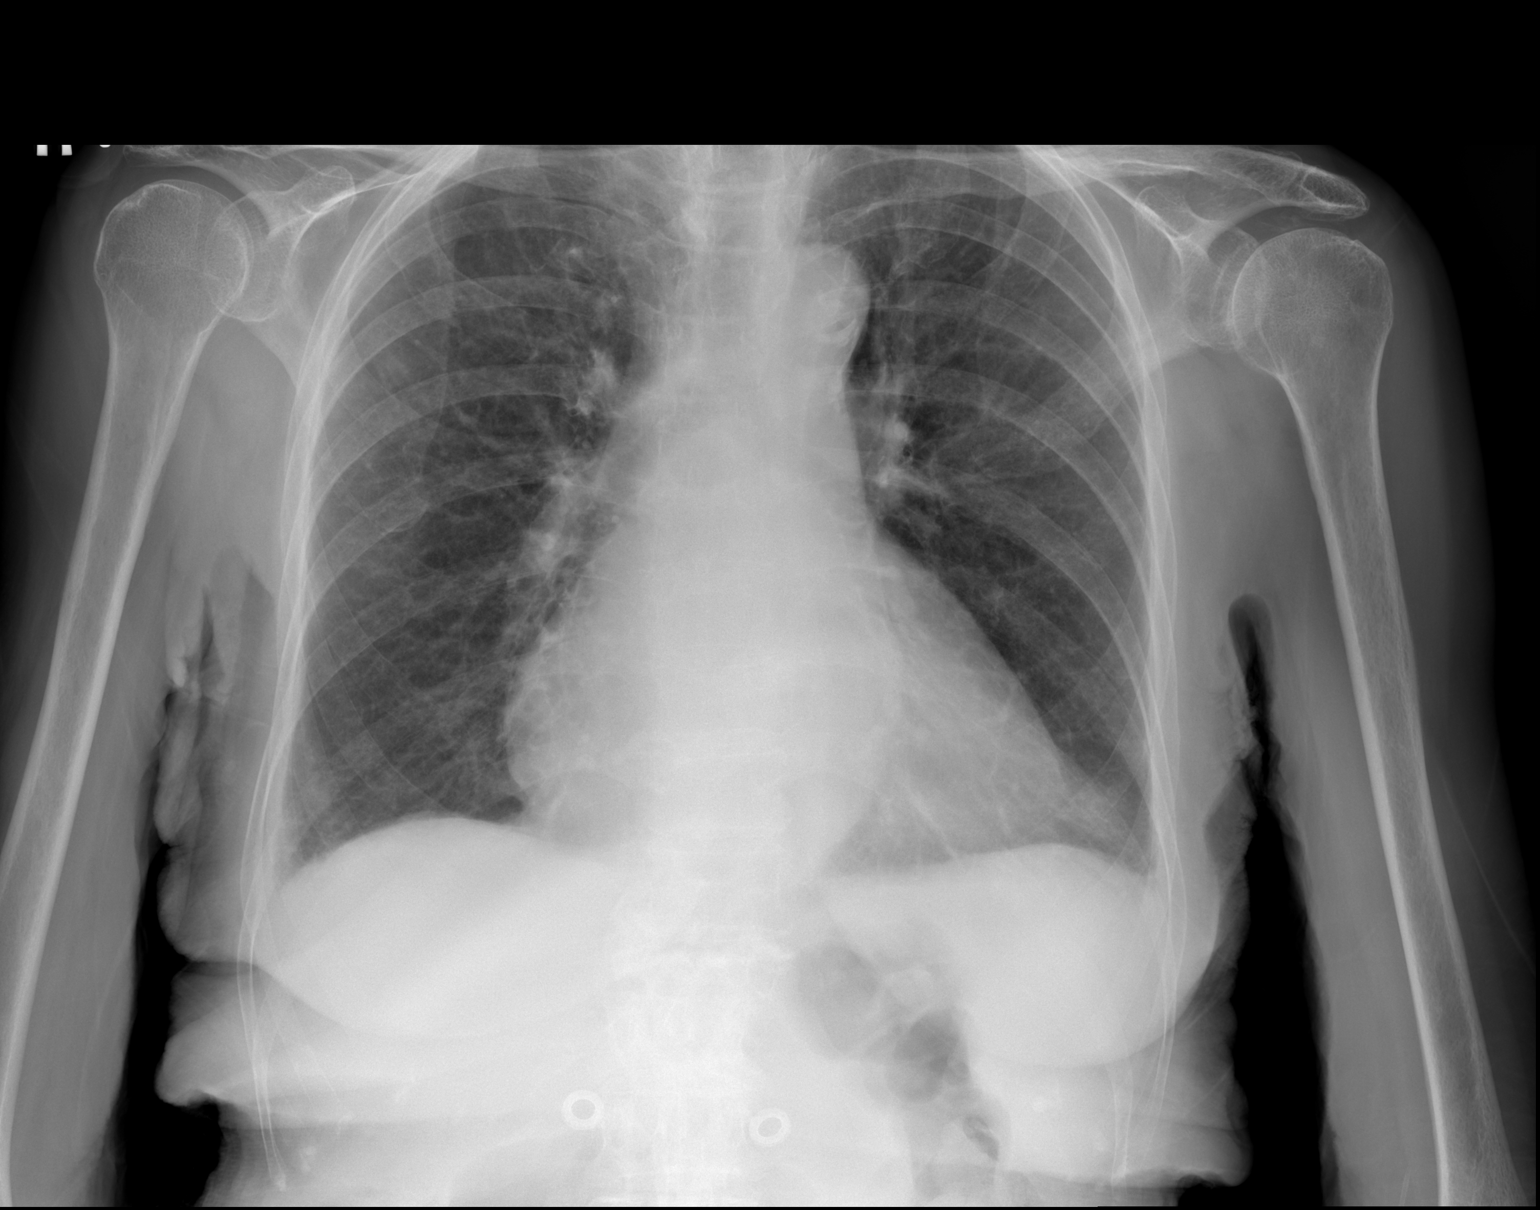
[im 2/2]
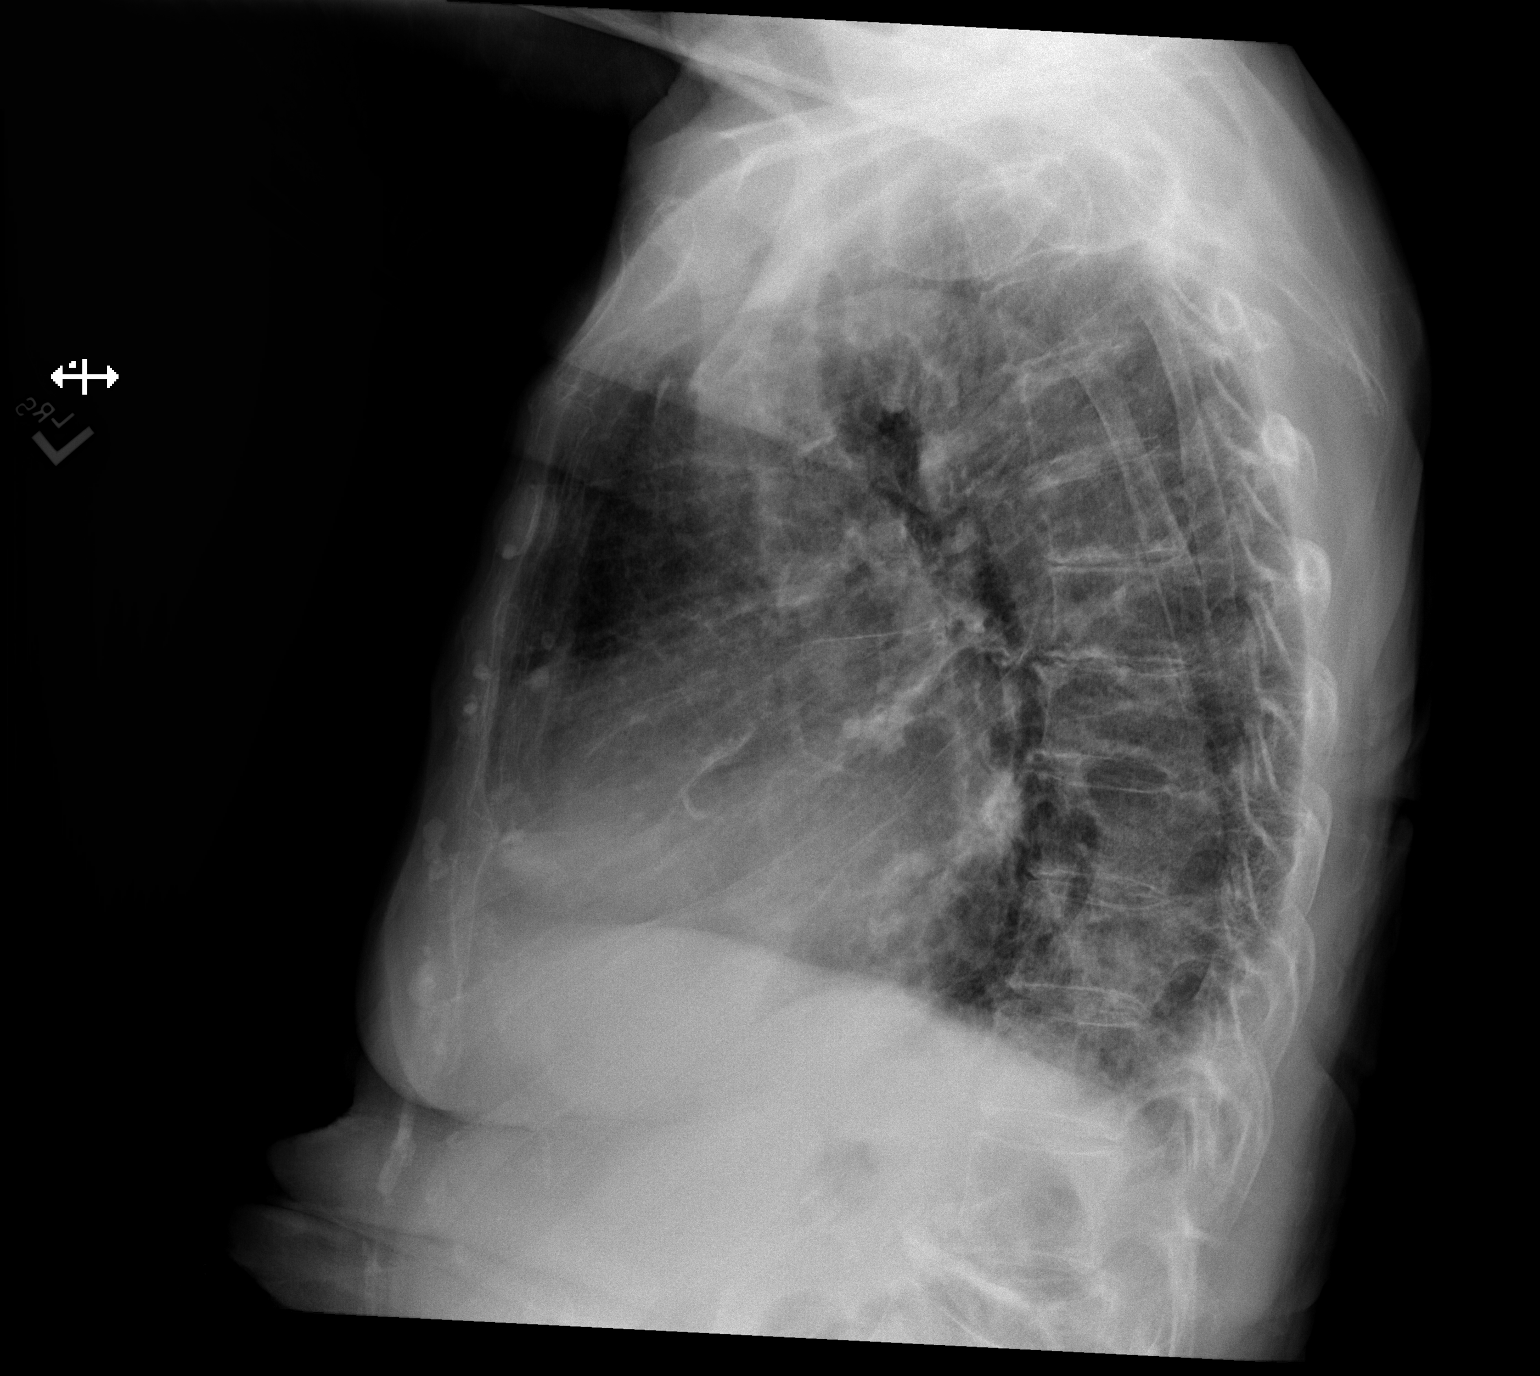

[2 of 2 positions shown; findings below may reference images not displayed]

FINDINGS: Mild cardiomegaly. No significant pleural effusion. Mild opacity at
the posterior lung base on lateral view, possibly left base. Aortic
atherosclerosis. No pneumothorax.
IMPRESSION: 1. Possible small posterior lung infiltrate on lateral view.
2. Cardiomegaly

## 2022-10-05 ENCOUNTER — Ambulatory Visit
Admission: RE | Admit: 2022-10-05 | Discharge: 2022-10-05 | Disposition: A | Discharge status: Home / Self Care | Payer: Medicare Other | Source: Ambulatory Visit | Attending: Physician Assistant | Admitting: Physician Assistant

## 2022-10-05 ENCOUNTER — Other Ambulatory Visit: Payer: Self-pay | Admitting: Physician Assistant

## 2022-10-05 DIAGNOSIS — M7989 Other specified soft tissue disorders: Secondary | ICD-10-CM

## 2022-10-05 DIAGNOSIS — M25472 Effusion, left ankle: Secondary | ICD-10-CM

## 2024-02-01 ENCOUNTER — Other Ambulatory Visit: Payer: Self-pay

## 2024-02-01 ENCOUNTER — Emergency Department
Admission: EM | Admit: 2024-02-01 | Discharge: 2024-02-01 | Disposition: A | Discharge status: Home / Self Care | Attending: Emergency Medicine | Admitting: Emergency Medicine

## 2024-02-01 ENCOUNTER — Emergency Department

## 2024-02-01 DIAGNOSIS — I10 Essential (primary) hypertension: Secondary | ICD-10-CM | POA: Insufficient documentation

## 2024-02-01 DIAGNOSIS — Z7901 Long term (current) use of anticoagulants: Secondary | ICD-10-CM | POA: Diagnosis not present

## 2024-02-01 DIAGNOSIS — N390 Urinary tract infection, site not specified: Secondary | ICD-10-CM | POA: Diagnosis not present

## 2024-02-01 DIAGNOSIS — R4182 Altered mental status, unspecified: Secondary | ICD-10-CM | POA: Insufficient documentation

## 2024-02-01 LAB — COMPREHENSIVE METABOLIC PANEL WITH GFR
ALT: 11 U/L (ref 0–44)
AST: 22 U/L (ref 15–41)
Albumin: 3.9 g/dL (ref 3.5–5.0)
Alkaline Phosphatase: 66 U/L (ref 38–126)
Anion gap: 8 (ref 5–15)
BUN: 21 mg/dL (ref 8–23)
CO2: 23 mmol/L (ref 22–32)
Calcium: 8.9 mg/dL (ref 8.9–10.3)
Chloride: 110 mmol/L (ref 98–111)
Creatinine, Ser: 0.76 mg/dL (ref 0.44–1.00)
GFR, Estimated: >60 mL/min (ref >60)
Glucose, Bld: 114 mg/dL — ABNORMAL HIGH (ref 70–99)
Potassium: 3.4 mmol/L — ABNORMAL LOW (ref 3.5–5.1)
Sodium: 141 mmol/L (ref 135–145)
Total Bilirubin: 0.9 mg/dL (ref 0.0–1.2)
Total Protein: 7.7 g/dL (ref 6.5–8.1)

## 2024-02-01 LAB — URINALYSIS, ROUTINE W REFLEX MICROSCOPIC
Bilirubin Urine: NEGATIVE
Glucose, UA: NEGATIVE mg/dL
Hgb urine dipstick: NEGATIVE
Ketones, ur: NEGATIVE mg/dL
Leukocytes,Ua: NEGATIVE
Nitrite: NEGATIVE
Protein, ur: 30 mg/dL — AB
Specific Gravity, Urine: 1.017 (ref 1.005–1.030)
pH: 5 (ref 5.0–8.0)

## 2024-02-01 LAB — CBC
HCT: 36 % (ref 36.0–46.0)
Hemoglobin: 11.8 g/dL — ABNORMAL LOW (ref 12.0–15.0)
MCH: 34.1 pg — ABNORMAL HIGH (ref 26.0–34.0)
MCHC: 32.8 g/dL (ref 30.0–36.0)
MCV: 104 fL — ABNORMAL HIGH (ref 80.0–100.0)
Platelets: 166 10*3/uL (ref 150–400)
RBC: 3.46 MIL/uL — ABNORMAL LOW (ref 3.87–5.11)
RDW: 13.6 % (ref 11.5–15.5)
WBC: 4.7 10*3/uL (ref 4.0–10.5)
nRBC: 0 % (ref 0.0–0.2)

## 2024-02-01 MED ORDER — LOSARTAN POTASSIUM 50 MG PO TABS
100.0000 mg | ORAL_TABLET | Freq: Once | ORAL | Status: AC
  Administered 2024-02-01: 100 mg via ORAL
  Filled 2024-02-01: qty 2

## 2024-02-01 MED ORDER — HYDRALAZINE HCL 20 MG/ML IJ SOLN
20.0000 mg | Freq: Once | INTRAMUSCULAR | Status: AC
  Administered 2024-02-01: 20 mg via INTRAVENOUS
  Filled 2024-02-01: qty 1

## 2024-02-01 MED ORDER — FOSFOMYCIN TROMETHAMINE 3 G PO PACK
3.0000 g | PACK | Freq: Once | ORAL | Status: AC
  Administered 2024-02-01: 3 g via ORAL
  Filled 2024-02-01: qty 3

## 2024-02-01 NOTE — ED Triage Notes (Addendum)
 Pt to ED via POV from [facility]. Pt called daughter and reported to her she felt more confused. Daughter states pt is normally A&Ox4. Pt unsure of why she is here. Pt is alert to self. Pt with hx of hyponatremia.   Family reports pt is also forgetting to take her medicine. Pt hypertensive in triage with hx of same.

## 2024-02-01 NOTE — ED Provider Notes (Signed)
 [facility] Provider Note   Event Date/Time   First MD Initiated Contact with Patient 02/01/24 1557     (approximate) History  Altered Mental Status (/)  HPI Yvette [name] is a 89 y.o. female with a past medical history of anemia, chronic back pain, hyperlipidemia, hypertension, and chronic atrial fibrillation on anticoagulation who presents via EMS after patient's daughter reported that she was more confused than usual.  Daughter states that patient is normally alert and oriented x 4 however when patient called her today she explained that she was confused and was concerned that she may not be taking her medication as prescribed.  Patient states that she feels back to her normal self now ROS: Patient currently denies any vision changes, tinnitus, difficulty speaking, facial droop, sore throat, chest pain, shortness of breath, abdominal pain, nausea/vomiting/diarrhea, dysuria, or weakness/numbness/paresthesias in any extremity   Physical Exam  Triage Vital Signs: ED Triage Vitals [02/01/24 1532]  Encounter Vitals Group     BP (!) 194/103     Systolic BP Percentile      Diastolic BP Percentile      Pulse Rate 78     Resp 20     Temp 98.1 F (36.7 C)     Temp Source Oral     SpO2 99 %     Weight      Height      Head Circumference      Peak Flow      Pain Score      Pain Loc      Pain Education      Exclude from Growth Chart    Most recent vital signs: Vitals:   02/01/24 1632 02/01/24 1700  BP: (!) 198/94 (!) 151/70  Pulse:  93  Resp:  16  Temp:    SpO2:  98%   General: Awake, oriented x4. CV:  Good peripheral perfusion. Resp:  Normal effort. Abd:  No distention. Other:  Elderly well-developed, well-nourished Caucasian female resting comfortably in no acute distress ED Results / Procedures / Treatments  Labs (all labs ordered are listed, but only abnormal results are displayed) Labs Reviewed  COMPREHENSIVE METABOLIC PANEL WITH GFR - Abnormal;  Notable for the following components:      Result Value   Potassium 3.4 (*)    Glucose, Bld 114 (*)    All other components within normal limits  CBC - Abnormal; Notable for the following components:   RBC 3.46 (*)    Hemoglobin 11.8 (*)    MCV 104.0 (*)    MCH 34.1 (*)    All other components within normal limits  URINALYSIS, ROUTINE W REFLEX MICROSCOPIC - Abnormal; Notable for the following components:   Color, Urine YELLOW (*)    APPearance CLOUDY (*)    Protein, ur 30 (*)    Bacteria, UA MANY (*)    All other components within normal limits  CBG MONITORING, ED   EKG ED ECG REPORT I, Charleen Conn, the attending physician, personally viewed and interpreted this ECG. Date: 02/01/2024 EKG Time: 1531 Rate: 78 Rhythm: normal sinus rhythm QRS Axis: normal Intervals: normal ST/T Wave abnormalities: normal Narrative Interpretation: no evidence of acute ischemia RADIOLOGY ED MD interpretation: CT of the head without contrast interpreted by me shows no evidence of acute abnormalities including no intracerebral hemorrhage, obvious masses, or significant edema - All radiology independently interpreted and agree with radiology assessment Official radiology report(s): CT HEAD WO CONTRAST Result Date: 02/01/2024  CLINICAL DATA:  Altered level of consciousness EXAM: CT HEAD WITHOUT CONTRAST TECHNIQUE: Contiguous axial images were obtained from the base of the skull through the vertex without intravenous contrast. RADIATION DOSE REDUCTION: This exam was performed according to the departmental dose-optimization program which includes automated exposure control, adjustment of the mA and/or kV according to patient size and/or use of iterative reconstruction technique. COMPARISON:  09/18/2006 FINDINGS: Brain: Confluent hypodensities throughout the periventricular white matter consistent with age-indeterminate small vessel ischemic changes. No other signs of acute infarct or hemorrhage. Lateral  ventricles and midline structures are unremarkable. No acute extra-axial fluid collections. No mass effect. Vascular: No hyperdense vessel or unexpected calcification. Skull: Normal. Negative for fracture or focal lesion. Sinuses/Orbits: No acute finding. Other: None. IMPRESSION: 1. Confluent hypodensities throughout the periventricular white matter consistent with age-indeterminate small vessel ischemic changes, favor chronic. 2. Otherwise no acute intracranial process. Electronically Signed   By: Bobbye Burrow M.D.   On: 02/01/2024 16:34   PROCEDURES: Critical Care performed: No Procedures MEDICATIONS ORDERED IN ED: Medications  hydrALAZINE  (APRESOLINE ) injection 20 mg (20 mg Intravenous Given 02/01/24 1632)  losartan  (COZAAR ) tablet 100 mg (100 mg Oral Given 02/01/24 1632)  fosfomycin (MONUROL) packet 3 g (3 g Oral Given 02/01/24 1723)   IMPRESSION / MDM / ASSESSMENT AND PLAN / ED COURSE  I reviewed the triage vital signs and the nursing notes.                             The patient is on the cardiac monitor to evaluate for evidence of arrhythmia and/or significant heart rate changes. Patient's presentation is most consistent with acute presentation with potential threat to life or bodily function. Not Pregnant. Unlikely TOA, Ovarian Torsion, PID, gonorrhea/chlamydia. Low suspicion for Infected Urolithiasis, AAA, Cholecystitis, Pancreatitis, SBO, Appendicitis, or other acute abdomen.  Tx: Fosfomycin 3 g Disposition: Discharge home. SRP discussed. Advise follow up with primary care provider within 24-72 hours.   FINAL CLINICAL IMPRESSION(S) / ED DIAGNOSES   Final diagnoses:  Altered mental status, unspecified altered mental status type  Lower urinary tract infectious disease   Rx / DC Orders   ED Discharge Orders     None      Note:  This document was prepared using Dragon voice recognition software and may include unintentional dictation errors.   Rikki Trosper K, MD 02/01/24  [phone]

## 2024-05-01 ENCOUNTER — Emergency Department
Admission: EM | Admit: 2024-05-01 | Discharge: 2024-05-01 | Disposition: A | Discharge status: Home / Self Care | Attending: Emergency Medicine | Admitting: Emergency Medicine

## 2024-05-01 ENCOUNTER — Other Ambulatory Visit: Payer: Self-pay

## 2024-05-01 ENCOUNTER — Emergency Department

## 2024-05-01 DIAGNOSIS — R519 Headache, unspecified: Secondary | ICD-10-CM | POA: Insufficient documentation

## 2024-05-01 DIAGNOSIS — W19XXXA Unspecified fall, initial encounter: Secondary | ICD-10-CM | POA: Diagnosis not present

## 2024-05-01 LAB — CBC
HCT: 35.4 % — ABNORMAL LOW (ref 36.0–46.0)
Hemoglobin: 11.7 g/dL — ABNORMAL LOW (ref 12.0–15.0)
MCH: 34.1 pg — ABNORMAL HIGH (ref 26.0–34.0)
MCHC: 33.1 g/dL (ref 30.0–36.0)
MCV: 103.2 fL — ABNORMAL HIGH (ref 80.0–100.0)
Platelets: 177 K/uL (ref 150–400)
RBC: 3.43 MIL/uL — ABNORMAL LOW (ref 3.87–5.11)
RDW: 14.1 % (ref 11.5–15.5)
WBC: 8.6 K/uL (ref 4.0–10.5)
nRBC: 0 % (ref 0.0–0.2)

## 2024-05-01 LAB — COMPREHENSIVE METABOLIC PANEL WITH GFR
ALT: 14 U/L (ref 0–44)
AST: 37 U/L (ref 15–41)
Albumin: 4.1 g/dL (ref 3.5–5.0)
Alkaline Phosphatase: 79 U/L (ref 38–126)
Anion gap: 14 (ref 5–15)
BUN: 13 mg/dL (ref 8–23)
CO2: 22 mmol/L (ref 22–32)
Calcium: 8.9 mg/dL (ref 8.9–10.3)
Chloride: 103 mmol/L (ref 98–111)
Creatinine, Ser: 0.73 mg/dL (ref 0.44–1.00)
GFR, Estimated: >60 mL/min (ref >60)
Glucose, Bld: 102 mg/dL — ABNORMAL HIGH (ref 70–99)
Potassium: 3.5 mmol/L (ref 3.5–5.1)
Sodium: 139 mmol/L (ref 135–145)
Total Bilirubin: 1.6 mg/dL — ABNORMAL HIGH (ref 0.0–1.2)
Total Protein: 8 g/dL (ref 6.5–8.1)

## 2024-05-01 LAB — URINALYSIS, ROUTINE W REFLEX MICROSCOPIC
Bacteria, UA: NONE SEEN
Bilirubin Urine: NEGATIVE
Glucose, UA: NEGATIVE mg/dL
Ketones, ur: NEGATIVE mg/dL
Leukocytes,Ua: NEGATIVE
Nitrite: NEGATIVE
Protein, ur: NEGATIVE mg/dL
Specific Gravity, Urine: 1.01 (ref 1.005–1.030)
WBC, UA: 0 WBC/hpf (ref 0–5)
pH: 8 (ref 5.0–8.0)

## 2024-05-01 NOTE — ED Provider Notes (Signed)
 [facility] Provider Note    Event Date/Time   First MD Initiated Contact with Patient 05/01/24 [phone]     (approximate)   History   Fall   HPI  Yvette [name] is a 89 y.o. female with a history of chronic atrial fibrillation, UTI, hyponatremia presents after a fall, she does not know precisely what caused her fall.  Complains of mild head discomfort.  Denies back pain, no abdominal pain, no extremity pain     Physical Exam   Triage Vital Signs: ED Triage Vitals  Encounter Vitals Group     BP 05/01/24 0927 (!) 184/96     Girls Systolic BP Percentile --      Girls Diastolic BP Percentile --      Boys Systolic BP Percentile --      Boys Diastolic BP Percentile --      Pulse Rate 05/01/24 0927 69     Resp 05/01/24 0927 (!) 26     Temp 05/01/24 0927 98.4 F (36.9 C)     Temp Source 05/01/24 0927 Oral     SpO2 05/01/24 0923 98 %     Weight 05/01/24 0931 49.2 kg (108 lb 8 oz)     Height --      Head Circumference --      Peak Flow --      Pain Score 05/01/24 0928 0     Pain Loc --      Pain Education --      Exclude from Growth Chart --     Most recent vital signs: Vitals:   05/01/24 0927 05/01/24 1100  BP: (!) 184/96 (!) 182/92  Pulse: 69   Resp: (!) 26 (!) 27  Temp: 98.4 F (36.9 C)   SpO2: 90%      General: Awake, no distress.  CV:  Good peripheral perfusion.  Resp:  Normal effort.  Abd:  No distention.  Other:  No evidence of head laceration or contusion, no vertebral tenderness to palpation, good range of motion of all extremities.   ED Results / Procedures / Treatments   Labs (all labs ordered are listed, but only abnormal results are displayed) Labs Reviewed  CBC - Abnormal; Notable for the following components:      Result Value   RBC 3.43 (*)    Hemoglobin 11.7 (*)    HCT 35.4 (*)    MCV 103.2 (*)    MCH 34.1 (*)    All other components within normal limits  COMPREHENSIVE METABOLIC PANEL WITH GFR - Abnormal; Notable  for the following components:   Glucose, Bld 102 (*)    Total Bilirubin 1.6 (*)    All other components within normal limits  URINALYSIS, ROUTINE W REFLEX MICROSCOPIC - Abnormal; Notable for the following components:   Color, Urine YELLOW (*)    APPearance CLEAR (*)    Hgb urine dipstick SMALL (*)    All other components within normal limits     EKG     RADIOLOGY Pelvis x-ray view interpret by me, no fracture noted, confirm by radiology    PROCEDURES:  Critical Care performed:   Procedures   MEDICATIONS ORDERED IN ED: Medications - No data to display   IMPRESSION / MDM / ASSESSMENT AND PLAN / ED COURSE  I reviewed the triage vital signs and the nursing notes. Patient's presentation is most consistent with acute presentation with potential threat to life or bodily function.  Patient presents after fall as  detailed above, overall generally well-appearing, she does think she suffered a head injury.  Will send for CT head, cervical spine, obtain pelvic x-rays as well as a chest x-ray.  She is mildly tachypneic upon arrival however is afebrile  ED lab work reviewed, not significant change from prior, urinalysis is negative for UTI, imaging is negative  Discussed with family they feel that she is at her baseline, no indication for admission at this time, appropriate for discharge with close outpatient follow-up        FINAL CLINICAL IMPRESSION(S) / ED DIAGNOSES   Final diagnoses:  Fall, initial encounter     Rx / DC Orders   ED Discharge Orders     None        Note:  This document was prepared using Dragon voice recognition software and may include unintentional dictation errors.   Arlander Charleston, MD 05/01/24 1311

## 2024-05-01 NOTE — ED Notes (Signed)
 Patient transported to CT

## 2024-05-01 NOTE — ED Triage Notes (Signed)
 Pt comes in via [facility] after a unwitnessed fall. Pt states that she was walking from her living room to her bedroom when she fell and hit her head. Pt reports no LOC. Pt is not on blood thinners. Pt has no complaints of pain at this time. Pt is alert and oriented x4.

## 2024-05-10 ENCOUNTER — Emergency Department

## 2024-05-10 ENCOUNTER — Other Ambulatory Visit: Payer: Self-pay

## 2024-05-10 ENCOUNTER — Inpatient Hospital Stay
Admission: EM | Admit: 2024-05-10 | Discharge: 2024-05-15 | DRG: 871 | Disposition: A | Discharge status: Skilled Nursing Facility | Attending: Internal Medicine | Admitting: Internal Medicine

## 2024-05-10 DIAGNOSIS — E162 Hypoglycemia, unspecified: Secondary | ICD-10-CM | POA: Diagnosis present

## 2024-05-10 DIAGNOSIS — N179 Acute kidney failure, unspecified: Secondary | ICD-10-CM | POA: Diagnosis present

## 2024-05-10 DIAGNOSIS — Z1152 Encounter for screening for COVID-19: Secondary | ICD-10-CM

## 2024-05-10 DIAGNOSIS — D539 Nutritional anemia, unspecified: Secondary | ICD-10-CM | POA: Diagnosis present

## 2024-05-10 DIAGNOSIS — E875 Hyperkalemia: Secondary | ICD-10-CM | POA: Diagnosis present

## 2024-05-10 DIAGNOSIS — E785 Hyperlipidemia, unspecified: Secondary | ICD-10-CM | POA: Diagnosis present

## 2024-05-10 DIAGNOSIS — D696 Thrombocytopenia, unspecified: Secondary | ICD-10-CM | POA: Diagnosis present

## 2024-05-10 DIAGNOSIS — Z515 Encounter for palliative care: Secondary | ICD-10-CM | POA: Diagnosis not present

## 2024-05-10 DIAGNOSIS — Z803 Family history of malignant neoplasm of breast: Secondary | ICD-10-CM

## 2024-05-10 DIAGNOSIS — I119 Hypertensive heart disease without heart failure: Secondary | ICD-10-CM | POA: Diagnosis present

## 2024-05-10 DIAGNOSIS — A419 Sepsis, unspecified organism: Principal | ICD-10-CM | POA: Diagnosis present

## 2024-05-10 DIAGNOSIS — Z79899 Other long term (current) drug therapy: Secondary | ICD-10-CM

## 2024-05-10 DIAGNOSIS — A040 Enteropathogenic Escherichia coli infection: Secondary | ICD-10-CM | POA: Diagnosis present

## 2024-05-10 DIAGNOSIS — R609 Edema, unspecified: Secondary | ICD-10-CM | POA: Diagnosis present

## 2024-05-10 DIAGNOSIS — Z681 Body mass index (BMI) 19 or less, adult: Secondary | ICD-10-CM | POA: Diagnosis not present

## 2024-05-10 DIAGNOSIS — I160 Hypertensive urgency: Secondary | ICD-10-CM | POA: Diagnosis present

## 2024-05-10 DIAGNOSIS — R531 Weakness: Secondary | ICD-10-CM

## 2024-05-10 DIAGNOSIS — E43 Unspecified severe protein-calorie malnutrition: Secondary | ICD-10-CM | POA: Diagnosis present

## 2024-05-10 DIAGNOSIS — I4510 Unspecified right bundle-branch block: Secondary | ICD-10-CM | POA: Diagnosis present

## 2024-05-10 DIAGNOSIS — Z7901 Long term (current) use of anticoagulants: Secondary | ICD-10-CM

## 2024-05-10 DIAGNOSIS — I3139 Other pericardial effusion (noninflammatory): Secondary | ICD-10-CM | POA: Diagnosis present

## 2024-05-10 DIAGNOSIS — I700 Atherosclerosis of aorta: Secondary | ICD-10-CM | POA: Diagnosis present

## 2024-05-10 DIAGNOSIS — I482 Chronic atrial fibrillation, unspecified: Secondary | ICD-10-CM | POA: Diagnosis present

## 2024-05-10 DIAGNOSIS — Z888 Allergy status to other drugs, medicaments and biological substances status: Secondary | ICD-10-CM

## 2024-05-10 DIAGNOSIS — R0682 Tachypnea, not elsewhere classified: Secondary | ICD-10-CM | POA: Diagnosis present

## 2024-05-10 DIAGNOSIS — Z7189 Other specified counseling: Secondary | ICD-10-CM | POA: Diagnosis not present

## 2024-05-10 DIAGNOSIS — M810 Age-related osteoporosis without current pathological fracture: Secondary | ICD-10-CM | POA: Diagnosis present

## 2024-05-10 DIAGNOSIS — E876 Hypokalemia: Secondary | ICD-10-CM | POA: Diagnosis present

## 2024-05-10 DIAGNOSIS — H9190 Unspecified hearing loss, unspecified ear: Secondary | ICD-10-CM | POA: Diagnosis present

## 2024-05-10 DIAGNOSIS — K529 Noninfective gastroenteritis and colitis, unspecified: Secondary | ICD-10-CM | POA: Diagnosis not present

## 2024-05-10 DIAGNOSIS — Z9181 History of falling: Secondary | ICD-10-CM

## 2024-05-10 DIAGNOSIS — Z885 Allergy status to narcotic agent status: Secondary | ICD-10-CM

## 2024-05-10 DIAGNOSIS — Z66 Do not resuscitate: Secondary | ICD-10-CM | POA: Diagnosis present

## 2024-05-10 LAB — URINALYSIS, ROUTINE W REFLEX MICROSCOPIC
Bacteria, UA: NONE SEEN
Bilirubin Urine: NEGATIVE
Glucose, UA: NEGATIVE mg/dL
Hgb urine dipstick: NEGATIVE
Ketones, ur: NEGATIVE mg/dL
Leukocytes,Ua: NEGATIVE
Nitrite: NEGATIVE
Protein, ur: 30 mg/dL — AB
Specific Gravity, Urine: 1.02 (ref 1.005–1.030)
pH: 5 (ref 5.0–8.0)

## 2024-05-10 LAB — CBC
HCT: 33.2 % — ABNORMAL LOW (ref 36.0–46.0)
Hemoglobin: 11.1 g/dL — ABNORMAL LOW (ref 12.0–15.0)
MCH: 34.4 pg — ABNORMAL HIGH (ref 26.0–34.0)
MCHC: 33.4 g/dL (ref 30.0–36.0)
MCV: 102.8 fL — ABNORMAL HIGH (ref 80.0–100.0)
Platelets: 132 K/uL — ABNORMAL LOW (ref 150–400)
RBC: 3.23 MIL/uL — ABNORMAL LOW (ref 3.87–5.11)
RDW: 13.6 % (ref 11.5–15.5)
WBC: 5.1 K/uL (ref 4.0–10.5)
nRBC: 0 % (ref 0.0–0.2)

## 2024-05-10 LAB — COMPREHENSIVE METABOLIC PANEL WITH GFR
ALT: 11 U/L (ref 0–44)
AST: 23 U/L (ref 15–41)
Albumin: 3.8 g/dL (ref 3.5–5.0)
Alkaline Phosphatase: 67 U/L (ref 38–126)
Anion gap: 11 (ref 5–15)
BUN: 16 mg/dL (ref 8–23)
CO2: 20 mmol/L — ABNORMAL LOW (ref 22–32)
Calcium: 8.4 mg/dL — ABNORMAL LOW (ref 8.9–10.3)
Chloride: 106 mmol/L (ref 98–111)
Creatinine, Ser: 1.12 mg/dL — ABNORMAL HIGH (ref 0.44–1.00)
GFR, Estimated: 45 mL/min — ABNORMAL LOW (ref >60)
Glucose, Bld: 97 mg/dL (ref 70–99)
Potassium: 3.3 mmol/L — ABNORMAL LOW (ref 3.5–5.1)
Sodium: 137 mmol/L (ref 135–145)
Total Bilirubin: 1.1 mg/dL (ref 0.0–1.2)
Total Protein: 7.4 g/dL (ref 6.5–8.1)

## 2024-05-10 LAB — RESP PANEL BY RT-PCR (RSV, FLU A&B, COVID)  RVPGX2
Influenza A by PCR: NEGATIVE
Influenza B by PCR: NEGATIVE
Resp Syncytial Virus by PCR: NEGATIVE
SARS Coronavirus 2 by RT PCR: NEGATIVE

## 2024-05-10 MED ORDER — IOHEXOL 300 MG/ML  SOLN
80.0000 mL | Freq: Once | INTRAMUSCULAR | Status: AC | PRN
  Administered 2024-05-10: 80 mL via INTRAVENOUS

## 2024-05-10 MED ORDER — LACTATED RINGERS IV BOLUS
1000.0000 mL | Freq: Once | INTRAVENOUS | Status: AC
  Administered 2024-05-10: 1000 mL via INTRAVENOUS

## 2024-05-10 NOTE — ED Triage Notes (Signed)
 Pt comes via [facility] from [facility] independent living with c/o weakness. Pt has been feeling weak and had little trouble getting up but does use walker. Pt had diarrhea yesterday.   Pt did have fall last week and was seen here in ED for that.

## 2024-05-10 NOTE — ED Provider Notes (Signed)
 [facility] Provider Note    Event Date/Time   First MD Initiated Contact with Patient 05/10/24 1850     (approximate)   History   Weakness   HPI  Yvette [name] is a 89 y.o. female who presents to the ED for evaluation of Weakness   History of A-fib on anticoagulation, HTN, HLD. Patient resides at a local independent living facility.  She presents to the ED alongside her daughter for evaluation of diarrhea for the past couple days and associated generalized weakness.   Physical Exam   Triage Vital Signs: ED Triage Vitals  Encounter Vitals Group     BP 05/10/24 1553 (!) 145/85     Girls Systolic BP Percentile --      Girls Diastolic BP Percentile --      Boys Systolic BP Percentile --      Boys Diastolic BP Percentile --      Pulse Rate 05/10/24 1553 86     Resp 05/10/24 1553 (!) 21     Temp 05/10/24 1553 100.1 F (37.8 C)     Temp Source 05/10/24 1553 Oral     SpO2 05/10/24 1553 98 %     Weight 05/10/24 1553 108 lb (49 kg)     Height 05/10/24 1848 4' 11 (1.499 m)     Head Circumference --      Peak Flow --      Pain Score 05/10/24 1553 0     Pain Loc --      Pain Education --      Exclude from Growth Chart --     Most recent vital signs: Vitals:   05/10/24 1930 05/10/24 2030  BP: (!) 174/89 121/79  Pulse: 68 77  Resp: (!) 27 (!) 24  Temp:    SpO2: 97% 96%    General: Awake, no distress.  CV:  Good peripheral perfusion.  Resp:  Normal effort.  Abd:  No distention.  Mild LLQ abdominal tenderness, otherwise nontender without guarding or peritoneal features. MSK:  No deformity noted.  Neuro:  No focal deficits appreciated. Other:     ED Results / Procedures / Treatments   Labs (all labs ordered are listed, but only abnormal results are displayed) Labs Reviewed  COMPREHENSIVE METABOLIC PANEL WITH GFR - Abnormal; Notable for the following components:      Result Value   Potassium 3.3 (*)    CO2 20 (*)    Creatinine, Ser  1.12 (*)    Calcium  8.4 (*)    GFR, Estimated 45 (*)    All other components within normal limits  CBC - Abnormal; Notable for the following components:   RBC 3.23 (*)    Hemoglobin 11.1 (*)    HCT 33.2 (*)    MCV 102.8 (*)    MCH 34.4 (*)    Platelets 132 (*)    All other components within normal limits  URINALYSIS, ROUTINE W REFLEX MICROSCOPIC - Abnormal; Notable for the following components:   Color, Urine YELLOW (*)    APPearance CLEAR (*)    Protein, ur 30 (*)    All other components within normal limits  RESP PANEL BY RT-PCR (RSV, FLU A&B, COVID)  [id number]  CBG MONITORING, ED    EKG A-fib with a rate of 76 bpm, incomplete right bundle, no STEMI.  RADIOLOGY RUQ ultrasound interpreted by me with no clear signs of acute cholecystitis CT abdomen/pelvis interpreted by me with signs of colitis  Official radiology  report(s): US  ABDOMEN LIMITED RUQ (LIVER/GB) Result Date: 05/10/2024 CLINICAL DATA:  Right upper quadrant pain EXAM: ULTRASOUND ABDOMEN LIMITED RIGHT UPPER QUADRANT COMPARISON:  CT abdomen and pelvis 05/10/2024 FINDINGS: Gallbladder: Gallbladder wall is upper limits of normal in thickness at 3 mm. There is no pericholecystic fluid. No gallstones are identified. Sonographic Beverley sign is negative. Common bile duct: Diameter: 4.6 mm. Liver: There is coarse echotexture diffusely with nodular liver contour. Echotexture is diffusely increased. There is a 1.4 x 1.2 x 1.8 cm cystic area in the left lobe of the liver corresponding to CT findings. Portal vein is patent on color Doppler imaging with normal direction of blood flow towards the liver. Other: None. IMPRESSION: 1. Cirrhotic liver morphology. 2. Gallbladder wall is upper limits of normal in thickness at 3 mm. No gallstones or pericholecystic fluid. Sonographic Beverley sign is negative. 3. 1.8 cm cystic area in the left lobe of the liver corresponding to CT findings. Electronically Signed   By: Greig Pique M.D.   On: 05/10/2024  23:04   CT ABDOMEN PELVIS W CONTRAST Result Date: 05/10/2024 CLINICAL DATA:  Acute diarrhea with weakness. EXAM: CT ABDOMEN AND PELVIS WITH CONTRAST TECHNIQUE: Multidetector CT imaging of the abdomen and pelvis was performed using the standard protocol following bolus administration of intravenous contrast. RADIATION DOSE REDUCTION: This exam was performed according to the departmental dose-optimization program which includes automated exposure control, adjustment of the mA and/or kV according to patient size and/or use of iterative reconstruction technique. CONTRAST:  80mL OMNIPAQUE  IOHEXOL  300 MG/ML  SOLN COMPARISON:  CT abdomen 12/15/2009. CT abdomen and pelvis 12/04/2006. FINDINGS: Lower chest: There is new cardiomegaly with small pericardial effusion. There is a small right pleural effusion. Hepatobiliary: There is markedly heterogeneous appearance of liver parenchyma. Liver has nodular contour. This is a new finding. There is a new hypodense lesion in the peripheral right lobe of the liver measuring 14 mm. There is a new hypodense area in the left lobe of the liver measuring 12 mm. There is diffuse gallbladder wall edema. There is no biliary ductal dilatation. Pancreas: Unremarkable. No pancreatic ductal dilatation or surrounding inflammatory changes. Spleen: Normal in size without focal abnormality. Adrenals/Urinary Tract: Adrenal glands are unremarkable. Kidneys are normal, without renal calculi, focal lesion, or hydronephrosis. Bladder is unremarkable. Stomach/Bowel: Stomach is within normal limits. Appendix appears normal. There are air-fluid levels scattered throughout the colon. There is mild diffuse colonic wall thickening. Small bowel is within normal limits. Proximal stomach is decompressed. There is questionable large diverticula with air-fluid level versus dilated proximal small bowel loop in the right upper quadrant image 2/37. This appears unchanged from multiple prior examinations dating back  to 1,008. No pneumatosis or free air. Vascular/Lymphatic: Aorta and IVC are normal in size. There are atherosclerotic calcifications of the aorta. No enlarged lymph nodes are identified. Reproductive: Uterus and bilateral adnexa are unremarkable. Other: There is a small amount of free fluid throughout the abdomen and pelvis. Musculoskeletal: Severe L1 compression fracture is again seen. There is retropulsion of fracture fragments. No acute osseous findings. IMPRESSION: 1. New cardiomegaly with small pericardial effusion. 2. New small right pleural effusion. 3. New heterogeneous appearance of the liver with nodular contour worrisome for cirrhosis. 4. New hypodense lesions in the liver are indeterminate. Recommend further evaluation with MRI. 5. Diffuse gallbladder wall edema. Findings may be related to hepatic dysfunction, but cholecystitis not excluded. 6. Mild diffuse colonic wall thickening with air-fluid levels compatible with colitis. 7. Small amount of free fluid  in the abdomen and pelvis. 8. Aortic atherosclerosis. Aortic Atherosclerosis (ICD10-I70.0). Electronically Signed   By: Greig Pique M.D.   On: 05/10/2024 21:47    PROCEDURES and INTERVENTIONS:  .1-3 Lead EKG Interpretation  Performed by: Claudene Rover, MD Authorized by: Claudene Rover, MD     Interpretation: normal     ECG rate:  80   ECG rate assessment: normal     Rhythm: atrial fibrillation     Ectopy: none     Conduction: normal     Medications  lactated ringers  bolus 1,000 mL (0 mLs Intravenous Stopped 05/10/24 2321)  iohexol  (OMNIPAQUE ) 300 MG/ML solution 80 mL (80 mLs Intravenous Contrast Given 05/10/24 2122)     IMPRESSION / MDM / ASSESSMENT AND PLAN / ED COURSE  I reviewed the triage vital signs and the nursing notes.  Differential diagnosis includes, but is not limited to, colitis, SBO, UTI, sepsis, AKI  {Patient presents with symptoms of an acute illness or injury that is potentially life-threatening.  Patient  presents to the ED with weakness associated with a diarrheal illness with signs of an AKI and colitis requiring medical admission.  Minimal localized tenderness, no leukocytosis, AKI is noted.  Urine without infectious features.  Imaging, as above.  Provided IV fluids and consult medicine for admission.  No diarrhea while she is here, daughter reports that she got Imodium at home earlier today  Clinical Course as of 05/10/24 2325  Sun May 10, 2024  2048 2 days diarrhea, weakness, from ILF [DS]  2259 S/o: 61F being admitted for weakness, diarrhea - CT w/ abnormal gallbladder - US  pending - anticipate admission  TO DO: - f/u US  [MM]  2310 Went to reassess and discussed with children at the bedside but they are no longer present.  I consult medicine for admission. [DS]    Clinical Course User Index [DS] Claudene Rover, MD [MM] Clarine Ozell LABOR, MD     FINAL CLINICAL IMPRESSION(S) / ED DIAGNOSES   Final diagnoses:  Acute colitis  Generalized weakness     Rx / DC Orders   ED Discharge Orders     None        Note:  This document was prepared using Dragon voice recognition software and may include unintentional dictation errors.   Claudene Rover, MD 05/10/24 [phone]

## 2024-05-11 DIAGNOSIS — I160 Hypertensive urgency: Secondary | ICD-10-CM

## 2024-05-11 DIAGNOSIS — K529 Noninfective gastroenteritis and colitis, unspecified: Secondary | ICD-10-CM | POA: Diagnosis not present

## 2024-05-11 DIAGNOSIS — E876 Hypokalemia: Secondary | ICD-10-CM

## 2024-05-11 LAB — PROTIME-INR
INR: 1.4 — ABNORMAL HIGH (ref 0.8–1.2)
Prothrombin Time: 17.4 s — ABNORMAL HIGH (ref 11.4–15.2)

## 2024-05-11 LAB — CBC
HCT: 32.9 % — ABNORMAL LOW (ref 36.0–46.0)
Hemoglobin: 10.8 g/dL — ABNORMAL LOW (ref 12.0–15.0)
MCH: 33.5 pg (ref 26.0–34.0)
MCHC: 32.8 g/dL (ref 30.0–36.0)
MCV: 102.2 fL — ABNORMAL HIGH (ref 80.0–100.0)
Platelets: 121 K/uL — ABNORMAL LOW (ref 150–400)
RBC: 3.22 MIL/uL — ABNORMAL LOW (ref 3.87–5.11)
RDW: 13.7 % (ref 11.5–15.5)
WBC: 6.8 K/uL (ref 4.0–10.5)
nRBC: 0 % (ref 0.0–0.2)

## 2024-05-11 LAB — CORTISOL-AM, BLOOD: Cortisol - AM: 17.9 ug/dL (ref 6.7–22.6)

## 2024-05-11 LAB — BASIC METABOLIC PANEL WITH GFR
Anion gap: 17 — ABNORMAL HIGH (ref 5–15)
BUN: 12 mg/dL (ref 8–23)
CO2: 19 mmol/L — ABNORMAL LOW (ref 22–32)
Calcium: 8.1 mg/dL — ABNORMAL LOW (ref 8.9–10.3)
Chloride: 103 mmol/L (ref 98–111)
Creatinine, Ser: 0.72 mg/dL (ref 0.44–1.00)
GFR, Estimated: >60 mL/min (ref >60)
Glucose, Bld: 83 mg/dL (ref 70–99)
Potassium: 3.8 mmol/L (ref 3.5–5.1)
Sodium: 139 mmol/L (ref 135–145)

## 2024-05-11 LAB — MAGNESIUM: Magnesium: 1.7 mg/dL (ref 1.7–2.4)

## 2024-05-11 MED ORDER — ONDANSETRON HCL 4 MG PO TABS: 4.0000 mg | ORAL_TABLET | Freq: Four times a day (QID) | ORAL | Status: DC | PRN

## 2024-05-11 MED ORDER — ACETAMINOPHEN 325 MG PO TABS: 650.0000 mg | ORAL_TABLET | Freq: Four times a day (QID) | ORAL | Status: DC | PRN

## 2024-05-11 MED ORDER — TRAZODONE HCL 50 MG PO TABS: 25.0000 mg | ORAL_TABLET | Freq: Every evening | ORAL | Status: DC | PRN

## 2024-05-11 MED ORDER — LOSARTAN POTASSIUM 50 MG PO TABS
100.0000 mg | ORAL_TABLET | Freq: Every day | ORAL | Status: DC
  Administered 2024-05-11 – 2024-05-15 (×5): 100 mg via ORAL
  Filled 2024-05-11 (×5): qty 2

## 2024-05-11 MED ORDER — MIRTAZAPINE 15 MG PO TABS
30.0000 mg | ORAL_TABLET | Freq: Every day | ORAL | Status: DC
  Administered 2024-05-11 – 2024-05-14 (×5): 30 mg via ORAL
  Filled 2024-05-11 (×5): qty 2

## 2024-05-11 MED ORDER — CLONIDINE HCL 0.1 MG PO TABS
0.1000 mg | ORAL_TABLET | Freq: Two times a day (BID) | ORAL | Status: DC
Start: 2024-05-11 — End: 2024-05-15
  Administered 2024-05-11 – 2024-05-15 (×9): 0.1 mg via ORAL
  Filled 2024-05-11 (×9): qty 1

## 2024-05-11 MED ORDER — ONDANSETRON HCL 4 MG/2ML IJ SOLN: 4.0000 mg | Freq: Four times a day (QID) | INTRAMUSCULAR | Status: DC | PRN

## 2024-05-11 MED ORDER — LACTATED RINGERS IV SOLN: INTRAVENOUS | Status: DC

## 2024-05-11 MED ORDER — POTASSIUM CHLORIDE 20 MEQ PO PACK
40.0000 meq | PACK | Freq: Once | ORAL | Status: AC
  Administered 2024-05-11: 40 meq via ORAL
  Filled 2024-05-11: qty 2

## 2024-05-11 MED ORDER — HYDRALAZINE HCL 20 MG/ML IJ SOLN: 5.0000 mg | Freq: Four times a day (QID) | INTRAMUSCULAR | Status: DC | PRN

## 2024-05-11 MED ORDER — SODIUM CHLORIDE 0.9 % IV SOLN
2.0000 g | INTRAVENOUS | Status: DC
  Administered 2024-05-11 – 2024-05-12 (×3): 2 g via INTRAVENOUS
  Filled 2024-05-11 (×3): qty 20

## 2024-05-11 MED ORDER — LACTATED RINGERS IV SOLN
150.0000 mL/h | INTRAVENOUS | Status: DC
  Administered 2024-05-11 (×2): 150 mL/h via INTRAVENOUS

## 2024-05-11 MED ORDER — METRONIDAZOLE 500 MG/100ML IV SOLN
500.0000 mg | Freq: Two times a day (BID) | INTRAVENOUS | Status: DC
  Administered 2024-05-11 – 2024-05-13 (×5): 500 mg via INTRAVENOUS
  Filled 2024-05-11 (×6): qty 100

## 2024-05-11 MED ORDER — ENOXAPARIN SODIUM 30 MG/0.3ML IJ SOSY
30.0000 mg | PREFILLED_SYRINGE | INTRAMUSCULAR | Status: DC
  Administered 2024-05-11 – 2024-05-15 (×5): 30 mg via SUBCUTANEOUS
  Filled 2024-05-11 (×5): qty 0.3

## 2024-05-11 MED ORDER — ACETAMINOPHEN 650 MG RE SUPP: 650.0000 mg | Freq: Four times a day (QID) | RECTAL | Status: DC | PRN

## 2024-05-11 MED ORDER — HYDRALAZINE HCL 10 MG PO TABS
10.0000 mg | ORAL_TABLET | Freq: Two times a day (BID) | ORAL | Status: DC
  Administered 2024-05-11 – 2024-05-15 (×8): 10 mg via ORAL
  Filled 2024-05-11 (×9): qty 1

## 2024-05-11 NOTE — Progress Notes (Signed)
 Anticoagulation monitoring(Lovenox ):  89 yo female ordered Lovenox  40 mg Q24h    Filed Weights   05/10/24 1553 05/10/24 1848  Weight: 49 kg (108 lb) 44.5 kg (98 lb)   BMI 19.8   Lab Results  Component Value Date   CREATININE 1.12 (H) 05/10/2024   CREATININE 0.73 05/01/2024   CREATININE 0.76 02/01/2024   Estimated Creatinine Clearance: 19.6 mL/min (A) (by C-G formula based on SCr of 1.12 mg/dL (H)). Hemoglobin & Hematocrit     Component Value Date/Time   HGB 11.1 (L) 05/10/2024 1556   HCT 33.2 (L) 05/10/2024 1556     Per Protocol for Patient with estCrcl < 30 ml/min and BMI < 40, will transition to Lovenox  30 mg Q24h.

## 2024-05-11 NOTE — Assessment & Plan Note (Addendum)
-   She has controlled ventricular response. - Her Eliquis  was discontinued by her provider.  This is likely secondary to fall risk as she was seen for a fall on 9/5. - Will continue to monitor her rhythm and rate. - Will utilize IV Cardizem as needed.

## 2024-05-11 NOTE — Assessment & Plan Note (Addendum)
-   The patient has subsequent sepsis as manifested by tachycardia and tachypnea. - She will be admitted to a medical telemetry bed. - Will continue antibiotic therapy with IV Rocephin  and Flagyl . - Will continue hydration with IV lactated ringer . - As needed antidiarrheals will be provided.

## 2024-05-11 NOTE — Progress Notes (Signed)
 PROGRESS NOTE Yvette [name]  FMW:[id number] DOB: August 10, 1924 DOA: 05/10/2024 PCP: Yvette Clarity, NP  Brief Narrative/Hospital Course: Yvette [name] is a 89 y.o. female with PMH of essential hypertension, osteoarthritis, and chronic atrial fibrillation not currently on Eliquis  per medical advice, who presented to the ED with the onset of diarrhea over the last couple days with associated generalized weakness and fever and chills w/o nausea or vomiting or abdominal pain. In ED: Low-grade temp 100.1 and RR 21>30s. heart rate of 86 and later 106 and pulse oximetry of 98% on room air. Labs revealed hypokalemia 3.3 and CO2 of 20 with calcium  8.4 and creatinine 1.12.  CBC showed mild anemia with hemoglobin 11.1 hematocrit 33.2 close to baseline and thrombocytopenia of 132.  Respiratory panel came back negative.  UA was unremarkable. EKG >atrial fibrillation with controlled ventricular response 76 with incomplete right bundle branch block.  It showed Q waves septally. CT Abdominal and pelvic W/: New cardiomegaly with small pericardial effusion.New small right pleural effusion. New heterogeneous appearance of the liver with nodular contour worrisome for cirrhosis. New hypodense lesions in the liver are indeterminate. Recommend further evaluation with MRI. Diffuse gallbladder wall edema. Findings may be related to hepatic dysfunction, but cholecystitis not excluded. Mild diffuse colonic wall thickening with air-fluid levels compatible with colitis. Small amount of free fluid in the abdomen and pelvis.Aortic atherosclerosis.  The patient was given 1 L bolus of IV lactated Ringer  AND admitted for further workup on ceftriaxone  and Flagyl .   Subjective: Seen and examined today She is very hard of hearing, has no specific complaint appears alert awake reports she is feeling fine Nursing reports she just had diarrhea Overnight afebrile blood pressure 150s-170s, respiration 20s-30s Labs reviewed BMP with bicarb 19 CBC  with hemoglobin 12.8 INR 1.4 platelet 121  Assessment and plan:   Diarrhea times few days with weakness and fever Acute colitis per CT finding Sepsis POA due to colitis: No abdominal pain but presenting with diarrhea CT findings noted as above.  Patient tachycardic tachypneic meeting sepsis criteria.Continue ceftriaxone , Flagyl  hydration  and  obtain stool studies.  Loss of appetite for few months Weight loss about 15 lb in past year: RD consult.Palliative medicine consult. Confirmed DNR status with her. Patient at risk for decompensation high risk for readmission-daughter is agreeable for palliative care and understands that if she does not improve but she may be candidate for hospice   Abnormal CT finding Worrisome for cirrhosis and Liver lesion in CT: needs nonemergent MRI likely before discharge if family wants to proceed - will benefit with outpatient GI follow-up. Family-daughter does not want to proceed.   Small pericardial effusion and small right pleural effusion: Asymptomatic patient will need close follow-up with chest x-ray and echocardiogram as outpatient.  No cardiac or respiratory complaints currently.  Daughter not interested to pursue further workup  Hypertensive urgency: Cont prn IV hydralazine  and labetalol  and home meds-clonidine  hydralazine  and losartan    Mild AKI and hyperkalemia: Resolved    Atrial fibrillation, chronic: Rate control. Her Eliquis  was discontinued by her provider.  This is likely secondary to fall risk as she was seen for a fall on 9/5. Will continue to monitor her rhythm  Malnutrition moderate Body mass index is 19.79 kg/m.: Augment diet   Macrocytic anemia: Check anemia panel  Mobility: PT OT consulted  DVT prophylaxis: enoxaparin  (LOVENOX ) injection 30 mg Start: 05/11/24 0800 Code Status:   Code Status: Limited: Do not attempt resuscitation (DNR) -DNR-LIMITED -Do Not Intubate/DNI  she is  DNR as per daughter. Family Communication:  plan of care discussed with patient at bedside.Daughter bedside. Patient status is: Remains hospitalized because of severity of illness. Level of care: Telemetry Medical   Dispo: The patient is from: ILF            Anticipated disposition: TBD Objective: Vitals last 24 hrs: Vitals:   05/11/24 0930 05/11/24 1000 05/11/24 1030 05/11/24 1155  BP: (!) 185/107 (!) 162/84 132/75 104/87  Pulse: 76 71 63 68  Resp: (!) 28 (!) 25 (!) 23 16  Temp:    97.6 F (36.4 C)  TempSrc:      SpO2: 97% 97% 98% 97%  Weight:      Height:        Physical Examination: General exam: alert awake, oriented, hard of hearing,older than stated age HEENT:Oral mucosa moist, Ear/Nose WNL grossly Respiratory system: Bilaterally clear BS,no use of accessory muscle Cardiovascular system: S1 & S2 +, No JVD. Gastrointestinal system: Abdomen soft, non- tender ,ND, BS+ Nervous System: Alert, awake, moving all extremities,and following commands. Extremities: extremities warm, leg edema neg Skin: No rashes,no icterus. MSK: Normal muscle bulk,tone, power   Medications reviewed:  Scheduled Meds:  cloNIDine   0.1 mg Oral BID   enoxaparin  (LOVENOX ) injection  30 mg Subcutaneous Q24H   hydrALAZINE   10 mg Oral BID   losartan   100 mg Oral Daily   mirtazapine   30 mg Oral QHS   Continuous Infusions:  cefTRIAXone  (ROCEPHIN )  IV Stopped (05/11/24 0119)   lactated ringers  150 mL/hr (05/11/24 0926)   metronidazole  500 mg (05/11/24 1226)   Diet: Diet Order             Diet clear liquid Room service appropriate? Yes; Fluid consistency: Thin  Diet effective now                    Data Reviewed: I have personally reviewed following labs and imaging studies ( see epic result tab) CBC: Recent Labs  Lab 05/10/24 1556 05/11/24 0437  WBC 5.1 6.8  HGB 11.1* 10.8*  HCT 33.2* 32.9*  MCV 102.8* 102.2*  PLT 132* 121*   CMP: Recent Labs  Lab 05/10/24 1556 05/11/24 0437  NA 137 139  K 3.3* 3.8  CL 106 103  CO2  20* 19*  GLUCOSE 97 83  BUN 16 12  CREATININE 1.12* 0.72  CALCIUM  8.4* 8.1*  MG 1.7  --    GFR: Estimated Creatinine Clearance: 27.4 mL/min (by C-G formula based on SCr of 0.72 mg/dL). Recent Labs  Lab 05/10/24 1556  AST 23  ALT 11  ALKPHOS 67  BILITOT 1.1  PROT 7.4  ALBUMIN 3.8   No results for input(s): LIPASE, AMYLASE in the last 168 hours. No results for input(s): AMMONIA in the last 168 hours. Coagulation Profile:  Recent Labs  Lab 05/11/24 0437  INR 1.4*   Unresulted Labs (From admission, onward)     Start     Ordered   05/12/24 0500  Basic metabolic panel with GFR  Daily,   R      05/11/24 0844   05/12/24 0500  CBC  Daily,   R      05/11/24 0844   05/12/24 0500  Vitamin B12  (Anemia Panel (PNL))  Tomorrow morning,   R        05/11/24 0844   05/12/24 0500  Folate  (Anemia Panel (PNL))  Tomorrow morning,   R        05/11/24 9155  05/12/24 0500  Iron and TIBC  (Anemia Panel (PNL))  Tomorrow morning,   R        05/11/24 0844   05/12/24 0500  Ferritin  (Anemia Panel (PNL))  Tomorrow morning,   R        05/11/24 0844   05/12/24 0500  Reticulocytes  (Anemia Panel (PNL))  Tomorrow morning,   R        05/11/24 0844   05/11/24 0845  Gastrointestinal Panel by PCR , Stool  (Gastrointestinal Panel by PCR, Stool                                                                                                                                                     **Does Not include CLOSTRIDIUM DIFFICILE testing. **If CDIFF testing is needed, place order from the C Difficile Testing order set.**)  Once,   R        05/11/24 0844   05/11/24 0844  C Difficile Quick Screen w PCR reflex  (C Difficile quick screen w PCR reflex panel )  Once, for 24 hours,   TIMED       References:    CDiff Information Tool   05/11/24 0844           Antimicrobials/Microbiology: Anti-infectives (From admission, onward)    Start     Dose/Rate Route Frequency Ordered Stop   05/11/24 0015   cefTRIAXone  (ROCEPHIN ) 2 g in sodium chloride  0.9 % 100 mL IVPB        2 g 200 mL/hr over 30 Minutes Intravenous Every 24 hours 05/11/24 0006 05/18/24 0014   05/11/24 0015  metroNIDAZOLE  (FLAGYL ) IVPB 500 mg        500 mg 100 mL/hr over 60 Minutes Intravenous Every 12 hours 05/11/24 0006 05/18/24 0014         Component Value Date/Time   SDES  11/23/2019 1615    URINE, RANDOM Performed at [facility], [address] [place] [place], [place] [address]    [facility]  11/23/2019 1615    NONE Performed at [facility], [address] [place], [place] [address]    CULT >=100,000 COLONIES/mL ESCHERICHIA COLI (A) 11/23/2019 1615   REPTSTATUS 11/25/2019 FINAL 11/23/2019 1615    Procedures:    Mennie LAMY, MD [facility] 05/11/2024, 2:06 PM

## 2024-05-11 NOTE — Evaluation (Signed)
 Physical Therapy Evaluation Patient Details Name: Yvette [name] MRN: [id number] DOB: 1924-03-29 Today's Date: 05/11/2024  History of Present Illness  89 y.o. female with medical history significant for essential hypertension, osteoarthritis, and chronic atrial fibrillation not currently on Eliquis  per medical advice, who presented to the emergency room with the onset of diarrhea over the last couple days with associated generalized weakness and tactile fever and chills.  Clinical Impression  Pt seen for PT evaluation with pt agreeable, received in bed, difficult to glean PLOF/home set up 2/2 HOH (information taken from chart). Pt is able to complete bed mobility with min assist, sit>stand with min assist, & ambulate in room/bathroom with RW & CGA. Pt is mobilizing fairly well but would benefit from ongoing PT services to address balance, gait with LRAD.       If plan is discharge home, recommend the following: A little help with walking and/or transfers;A little help with bathing/dressing/bathroom;Assistance with cooking/housework;Help with stairs or ramp for entrance   Can travel by private vehicle        Equipment Recommendations Rolling walker (2 wheels);BSC/3in1  Recommendations for Other Services       Functional Status Assessment Patient has had a recent decline in their functional status and demonstrates the ability to make significant improvements in function in a reasonable and predictable amount of time.     Precautions / Restrictions Precautions Precautions: Fall Restrictions Weight Bearing Restrictions Per Provider Order: No      Mobility  Bed Mobility Overal bed mobility: Needs Assistance Bed Mobility: Supine to Sit     Supine to sit: Min assist, Used rails, HOB elevated          Transfers Overall transfer level: Needs assistance Equipment used: Rolling walker (2 wheels) Transfers: Sit to/from Stand Sit to Stand: Min assist           General transfer  comment: sit>stand from EOB, toilet with grab bar    Ambulation/Gait Ambulation/Gait assistance: Contact guard assist Gait Distance (Feet): 15 Feet (+ 20 ft) Assistive device: Rolling walker (2 wheels) Gait Pattern/deviations: Decreased step length - right, Decreased step length - left, Decreased stride length Gait velocity: decreased        Stairs            Wheelchair Mobility     Tilt Bed    Modified Rankin (Stroke Patients Only)       Balance Overall balance assessment: Needs assistance Sitting-balance support: Feet supported Sitting balance-Leahy Scale: Good Sitting balance - Comments: peri hygiene without LOB when toileting   Standing balance support: Bilateral upper extremity supported, During functional activity, Reliant on assistive device for balance, No upper extremity supported Standing balance-Leahy Scale: Fair Standing balance comment: hand hygiene standing at sink with CGA<>close supervision                             Pertinent Vitals/Pain Pain Assessment Pain Assessment: No/denies pain    Home Living Family/patient expects to be discharged to:: Other (Comment) [facility] ILF) Living Arrangements: Alone Available Help at Discharge: Family;Available PRN/intermittently Type of Home: Independent living facility Home Access: Level entry       Home Layout: One level Home Equipment: Cane - single point      Prior Function Prior Level of Function : Independent/Modified Independent               ADLs Comments: Pt ambulates with use of SPC and is able  to Independently dress, wash, and ambulates to dinning room herself. Someone has been hired to help with AM and PM meals. Pt does not drive so family assists with appts.     Extremity/Trunk Assessment   Upper Extremity Assessment Upper Extremity Assessment: Overall WFL for tasks assessed    Lower Extremity Assessment Lower Extremity Assessment: Generalized weakness        Communication   Communication Communication: Impaired Factors Affecting Communication: Hearing impaired    Cognition Arousal: Alert Behavior During Therapy: WFL for tasks assessed/performed   PT - Cognitive impairments: Difficult to assess Difficult to assess due to: Hard of hearing/deaf                       Following commands: Impaired Following commands impaired: Follows one step commands with increased time     Cueing Cueing Techniques: Verbal cues, Visual cues, Tactile cues     General Comments General comments (skin integrity, edema, etc.): continent void on toilet    Exercises     Assessment/Plan    PT Assessment Patient needs continued PT services  PT Problem List Decreased coordination;Decreased range of motion;Decreased strength;Decreased activity tolerance;Decreased balance;Decreased mobility;Decreased knowledge of use of DME;Decreased safety awareness       PT Treatment Interventions DME instruction;Balance training;Modalities;Neuromuscular re-education;Gait training;Stair training;Patient/family education;Functional mobility training;Therapeutic exercise;Therapeutic activities    PT Goals (Current goals can be found in the Care Plan section)  Acute Rehab PT Goals Patient Stated Goal: none stated PT Goal Formulation: With patient Time For Goal Achievement: 05/25/24 Potential to Achieve Goals: Good    Frequency Min 2X/week     Co-evaluation               AM-PAC PT 6 Clicks Mobility  Outcome Measure Help needed turning from your back to your side while in a flat bed without using bedrails?: None Help needed moving from lying on your back to sitting on the side of a flat bed without using bedrails?: A Little Help needed moving to and from a bed to a chair (including a wheelchair)?: A Little Help needed standing up from a chair using your arms (e.g., wheelchair or bedside chair)?: A Little Help needed to walk in hospital room?: A  Little Help needed climbing 3-5 steps with a railing? : A Lot 6 Click Score: 18    End of Session   Activity Tolerance: Patient tolerated treatment well Patient left: in chair;with chair alarm set;with call bell/phone within reach Nurse Communication: Mobility status PT Visit Diagnosis: Other abnormalities of gait and mobility (R26.89);Muscle weakness (generalized) (M62.81)    Time: 8394-8377 PT Time Calculation (min) (ACUTE ONLY): 17 min   Charges:   PT Evaluation $PT Eval Low Complexity: 1 Low   PT General Charges $$ ACUTE PT VISIT: 1 Visit         Richerd Pinal, PT, DPT 05/11/24, 4:33 PM'   Richerd CHRISTELLA Pinal 05/11/2024, 4:32 PM

## 2024-05-11 NOTE — Assessment & Plan Note (Signed)
-   The patient will be placed on as needed IV hydralazine  and labetalol . - Will continue antihypertensives.

## 2024-05-11 NOTE — Assessment & Plan Note (Signed)
-   Will replace potassium and check magnesium level.

## 2024-05-11 NOTE — Hospital Course (Addendum)
 Yvette [name] is a 89 y.o. female with PMH of essential hypertension, osteoarthritis, and chronic atrial fibrillation not currently on Eliquis  per medical advice, who presented to the ED with the onset of diarrhea over the last couple days with associated generalized weakness and fever and chills w/o nausea or vomiting or abdominal pain. In ED: Low-grade temp 100.1 and RR 21>30s. heart rate of 86 and later 106 and pulse oximetry of 98% on room air. Labs revealed hypokalemia 3.3 and CO2 of 20 with calcium  8.4 and creatinine 1.12.  CBC showed mild anemia with hemoglobin 11.1 hematocrit 33.2 close to baseline and thrombocytopenia of 132.  Respiratory panel came back negative.  UA was unremarkable. EKG >atrial fibrillation with controlled ventricular response 76 with incomplete right bundle branch block.  It showed Q waves septally. CT Abdominal and pelvic W/: New cardiomegaly with small pericardial effusion.New small right pleural effusion. New heterogeneous appearance of the liver with nodular contour worrisome for cirrhosis. New hypodense lesions in the liver are indeterminate. Recommend further evaluation with MRI. Diffuse gallbladder wall edema. Findings may be related to hepatic dysfunction, but cholecystitis not excluded. Mild diffuse colonic wall thickening with air-fluid levels compatible with colitis. Small amount of free fluid in the abdomen and pelvis.Aortic atherosclerosis.  The patient was given 1 L bolus of IV lactated Ringer  AND admitted for further workup on ceftriaxone  and Flagyl .  9/17: Hemodynamically stable, improving diarrhea, GI pathogen panel with enteropathogenic E. Coli.  Discontinuing ceftriaxone  and Flagyl , given a dose of 1 g of Zithromax .  PT recommending SNF-pending insurance authorization. 9/18: Hemodynamically stable, obtained insurance authorization to go to rehab tomorrow 9/19: Vitals and labs stable.  Patient will continue with her home medications and need to have a  close follow-up with her PCP for further assistance.  She is being discharged to SNF for rehab.  Assessment and plan:   Diarrhea times few days with weakness and fever Acute colitis per CT finding Sepsis POA due to colitis: No abdominal pain but presenting with diarrhea CT findings noted as above.Patient w/  tachycardic tachypneic meeting sepsis criteria on admission.  Overall clinically improving, tolerating advancement in diet.  C. difficile PCR negative, GI pathogen panel with enteropathogenic E. coli -Discontinue ceftriaxone  and Flagyl  - 1 dose of 1 g of Zithromax  was given on 9/17 - Continue supportive care  Loss of appetite for few months Weight loss about 15 lb in past year Hypoglycemia: RD consulted.Palliative medicine consulted.Confirmed DNR status with POA Patient at risk for decompensation high risk for readmission-daughter is agreeable for palliative care and understands that if she does not improve but she may be candidate for hospice Encourage hydration-  Abnormal CT finding Worrisome for cirrhosis and Liver lesion in CT: needs nonemergent MRI likely before discharge if family wants to proceed - will benefit with outpatient GI follow-up. Family-daughter does not want to proceed with further or given her age.   Small pericardial effusion and small right pleural effusion: Asymptomatic patient will need close follow-up with chest x-ray and echocardiogram as outpatient.  No cardiac or respiratory complaints currently.  Daughter not interested to pursue further workup  Hypertensive urgency: Blood pressure now well-controlled continue oral meds including as needed. Cont home clonidine  hydralazine  and losartan    Mild AKI Resolved .    Hypokalemia. Hypomagnesemia. Likely secondary to GI losses, potassium of 3 and magnesium  of 1.3 today. - Replace magnesium  and potassium -Continue to monitor   Atrial fibrillation, chronic: Rate controlled.Her Eliquis  was discontinued by  her provider  likely secondary to fall risk as she was seen for a fall on 9/5. Will continue to monitor her rhythm  Malnutrition Severe: Body mass index is 19.79 kg/m.: Dietitian consult.  Macrocytic anemia: Anemia panel stable.  Mobility: PT OT consulted  DVT prophylaxis: enoxaparin  (LOVENOX ) injection 30 mg Start: 05/11/24 0800 Code Status:   Code Status: Limited: Do not attempt resuscitation (DNR) -DNR-LIMITED -Do Not Intubate/DNI  she is DNR as per daughter. Family Communication: Discussed with daughter at bedside  Patient status is: Remains hospitalized because of severity of illness. Level of care: Telemetry Medical   Dispo: The patient is from: ILF            Anticipated disposition: SNF  Objective: Vitals last 24 hrs: Vitals:   05/11/24 2016 05/12/24 0020 05/12/24 0511 05/12/24 0800  BP: (!) 174/98 (!) 165/89 (!) 148/92 (!) 150/70  Pulse: 79 87 68 93  Resp: 16 16 19 18   Temp: 98.1 F (36.7 C)  97.8 F (36.6 C) 98 F (36.7 C)  TempSrc: Oral     SpO2: 97% 96% 95% 97%  Weight:      Height:        Physical Examination: General.  Frail and malnourished elderly lady, in no acute distress. Pulmonary.  Lungs clear bilaterally, normal respiratory effort. CV.  Regular rate and rhythm, no JVD, rub or murmur. Abdomen.  Soft, nontender, nondistended, BS positive. CNS.  Alert and oriented .  No focal neurologic deficit. Extremities.  No edema, no cyanosis, pulses intact and symmetrical. Psychiatry.  Judgment and insight appears normal.    Medications reviewed:  Scheduled Meds:  cloNIDine   0.1 mg Oral BID   enoxaparin  (LOVENOX ) injection  30 mg Subcutaneous Q24H   hydrALAZINE   10 mg Oral BID   losartan   100 mg Oral Daily   mirtazapine   30 mg Oral QHS   Continuous Infusions:  cefTRIAXone  (ROCEPHIN )  IV Stopped (05/12/24 0040)   dextrose  5 % and 0.9 % NaCl with KCl 20 mEq/L 40 mL/hr at 05/12/24 1029   metronidazole  Stopped (05/12/24 0243)   Diet: Diet Order              DIET SOFT Room service appropriate? Yes; Fluid consistency: Thin  Diet effective now

## 2024-05-11 NOTE — Plan of Care (Signed)
   Problem: Clinical Measurements: Goal: Diagnostic test results will improve Outcome: Progressing

## 2024-05-11 NOTE — Evaluation (Signed)
 Occupational Therapy Evaluation Patient Details Name: Yvette [name] MRN: [id number] DOB: [birth year] Today's Date: 05/11/2024   History of Present Illness   89 y.o. female with medical history significant for essential hypertension, osteoarthritis, and chronic atrial fibrillation not currently on Eliquis  per medical advice, who presented to the emergency room with the onset of diarrhea over the last couple days with associated generalized weakness and tactile fever and chills.     Clinical Impressions Patient presenting with decreased Ind in self care,balance, mobility, transfers, endurance, and safety awareness. Patient's daughter present in room at time of evaluation to confirm baseline. Pt lives at [facility] in an apartment with use of [facility] for mobility. She is Ind at baseline for self care tasks and ambulates to dining room for meals. Someone does assist her with medications. She has had a decline over the last several weeks. Pt needing min guard for bed mobility. Pt stands with min A from EOB with use of RW. She begins to urinate in standing but is able to ambulate with min guard and RW to bathroom for toileting needs. Pt needing min A for balance while pt performs hygiene. Min guard when standing at sink for balance during hand hygiene and LOB requiring min A when exiting bathroom this session.Pt fatigues quickly. Patient will benefit from acute OT to increase overall independence in the areas of ADLs, functional mobility, and safety awareness in order to safely discharge.     If plan is discharge home, recommend the following:   A little help with walking and/or transfers;A little help with bathing/dressing/bathroom;Assist for transportation;Help with stairs or ramp for entrance     Functional Status Assessment   Patient has had a recent decline in their functional status and demonstrates the ability to make significant improvements in function in a reasonable and predictable amount  of time.     Equipment Recommendations   None recommended by OT      Precautions/Restrictions   Precautions Precautions: Fall     Mobility Bed Mobility Overal bed mobility: Needs Assistance Bed Mobility: Sit to Supine, Supine to Sit     Supine to sit: Min assist Sit to supine: Contact guard assist        Transfers Overall transfer level: Needs assistance Equipment used: Rolling walker (2 wheels) Transfers: Sit to/from Stand Sit to Stand: Min assist, Contact guard assist                  Balance Overall balance assessment: Mild deficits observed, not formally tested                                         ADL either performed or assessed with clinical judgement   ADL Overall ADL's : Needs assistance/impaired     Grooming: Wash/dry hands;Contact guard assist;Standing                   Toilet Transfer: Minimal assistance;Rolling walker (2 wheels)   Toileting- Clothing Manipulation and Hygiene: Minimal assistance;Sit to/from stand               Vision Patient Visual Report: No change from baseline              Pertinent Vitals/Pain Pain Assessment Pain Assessment: No/denies pain     Extremity/Trunk Assessment Upper Extremity Assessment Upper Extremity Assessment: Generalized weakness   Lower Extremity Assessment Lower Extremity Assessment: Generalized  weakness       Communication Communication Communication: Impaired Factors Affecting Communication: Hearing impaired   Cognition Arousal: Alert Behavior During Therapy: WFL for tasks assessed/performed Cognition: No apparent impairments                               Following commands: Intact                  Home Living Family/patient expects to be discharged to:: Other (Comment) ([facility]) Living Arrangements: Alone Available Help at Discharge: Family;Available PRN/intermittently Type of Home: Independent living facility  ([facility]) Home Access: Level entry     Home Layout: One level     Bathroom Shower/Tub: Producer, television/film/video: Standard Bathroom Accessibility: Yes   Home Equipment: Cane - single point          Prior Functioning/Environment Prior Level of Function : Independent/Modified Independent               ADLs Comments: Pt ambulates with use of SPC and is able to Independently dress, wash, and ambulates to dinning room herself. Someone has been hired to help with AM and PM meals. Pt does not drive so family assists with appts.    OT Problem List: Decreased strength;Decreased safety awareness;Decreased activity tolerance;Decreased knowledge of use of DME or AE;Impaired balance (sitting and/or standing)   OT Treatment/Interventions: Self-care/ADL training;Therapeutic activities;Energy conservation;Balance training;Patient/family education      OT Goals(Current goals can be found in the care plan section)   Acute Rehab OT Goals Patient Stated Goal: to get stronger and go to rehab OT Goal Formulation: With patient/family Time For Goal Achievement: 05/25/24 Potential to Achieve Goals: Fair ADL Goals Pt Will Perform Grooming: (P) with modified independence Pt Will Perform Lower Body Dressing: (P) with modified independence Pt Will Transfer to Toilet: (P) with modified independence Pt Will Perform Toileting - Clothing Manipulation and hygiene: (P) with modified independence   OT Frequency:  Min 2X/week       AM-PAC OT 6 Clicks Daily Activity     Outcome Measure Help from another person eating meals?: None Help from another person taking care of personal grooming?: None Help from another person toileting, which includes using toliet, bedpan, or urinal?: A Little Help from another person bathing (including washing, rinsing, drying)?: A Little Help from another person to put on and taking off regular upper body clothing?: A Little Help from another person to  put on and taking off regular lower body clothing?: A Little 6 Click Score: 20   End of Session Equipment Utilized During Treatment: Rolling walker (2 wheels) Nurse Communication: Mobility status  Activity Tolerance: Patient tolerated treatment well Patient left: in bed;with call bell/phone within reach;with bed alarm set;with family/visitor present;with nursing/sitter in room  OT Visit Diagnosis: Unsteadiness on feet (R26.81);Muscle weakness (generalized) (M62.81)                Time: 8794-8769 OT Time Calculation (min): 25 min Charges:  OT General Charges $OT Visit: 1 Visit OT Evaluation $OT Eval Moderate Complexity: 1 Mod OT Treatments $Self Care/Home Management : 8-22 mins  Izetta Claude, MS, OTR/L , CBIS ascom [phone]  05/11/24, 1:39 PM

## 2024-05-11 NOTE — H&P (Signed)
 [place]   PATIENT NAME: Yvette [name]    MR#:  [id number]  DATE OF BIRTH:  [birth year]  DATE OF ADMISSION:  05/10/2024  PRIMARY CARE PHYSICIAN: Salina Clarity, NP   Patient is coming from: Home  REQUESTING/REFERRING PHYSICIAN: Claudene Rover, MD  CHIEF COMPLAINT:   Chief Complaint  Patient presents with   Weakness    HISTORY OF PRESENT ILLNESS:  Viha Kriegel is a 89 y.o. female with medical history significant for essential hypertension, osteoarthritis, and chronic atrial fibrillation not currently on Eliquis  per medical advice, who presented to the emergency room with the onset of diarrhea over the last couple days with associated generalized weakness and tactile fever and chills.  No nausea or vomiting or abdominal pain.  No chest pain or palpitations.  No cough or wheezing or dyspnea.  No dysuria, oliguria or hematuria or flank pain.  No melena or bright red blood per rectum.  No other bleeding diathesis.  ED Course: When she came to the ER, BP was 145/85 with temperature 100.1 and respiratory rate of 21 and later 28, heart rate of 86 and later 106 and pulse oximetry of 98% on room air.  Labs revealed hypokalemia 3.3 and CO2 of 20 with calcium  8.4 and creatinine 1.12.  CBC showed mild anemia with hemoglobin 11.1 hematocrit 33.2 close to baseline and thrombocytopenia of 132.  Respiratory panel came back negative.  UA was unremarkable. EKG as reviewed by me : EKG showed atrial fibrillation with controlled ventricular response 76 with incomplete right bundle branch block.  It showed Q waves septally. Imaging: Abdominal and pelvic CT scan with contrast revealed the following: 1. New cardiomegaly with small pericardial effusion. 2. New small right pleural effusion. 3. New heterogeneous appearance of the liver with nodular contour worrisome for cirrhosis. 4. New hypodense lesions in the liver are indeterminate. Recommend further evaluation with MRI. 5. Diffuse gallbladder wall edema.  Findings may be related to hepatic dysfunction, but cholecystitis not excluded. 6. Mild diffuse colonic wall thickening with air-fluid levels compatible with colitis. 7. Small amount of free fluid in the abdomen and pelvis. 8. Aortic atherosclerosis.  The patient was given 1 L bolus of IV lactated Ringer .  She will be admitted to a medical telemetry bed for further evaluation and management.   PAST MEDICAL HISTORY:   Past Medical History:  Diagnosis Date   Arthritis    Cystocele    midline   Hypertension    Low sodium levels    Osteoporosis    Procidentia of uterus   Chronic atrial fibrillation  PAST SURGICAL HISTORY:   Past Surgical History:  Procedure Laterality Date   NO PAST SURGERIES      SOCIAL HISTORY:   Social History   Tobacco Use   Smoking status: Never   Smokeless tobacco: Never  Substance Use Topics   Alcohol use: No    FAMILY HISTORY:   Family History  Problem Relation Age of Onset   Breast cancer Maternal Aunt    Breast cancer Paternal Aunt    Ovarian cancer Neg Hx    Diabetes Neg Hx     DRUG ALLERGIES:   Allergies  Allergen Reactions   Metoprolol  Swelling    In legs In legs    Amlodipine Other (See Comments)    edema   Hydrocodone-Acetaminophen  Nausea Only    REVIEW OF SYSTEMS:   ROS As per history of present illness. All pertinent systems were reviewed above. Constitutional, HEENT, cardiovascular, respiratory,  GI, GU, musculoskeletal, neuro, psychiatric, endocrine, integumentary and hematologic systems were reviewed and are otherwise negative/unremarkable except for positive findings mentioned above in the HPI.   MEDICATIONS AT HOME:   Prior to Admission medications   Medication Sig Start Date End Date Taking? Authorizing Provider  calcium  citrate-vitamin D  (CITRACAL+D) 315-200 MG-UNIT tablet Take 1 tablet by mouth daily.    Yes [provider]  cloNIDine  (CATAPRES ) 0.1 MG tablet Take 1 tablet (0.1 mg total) by mouth 2  (two) times daily. 11/12/19  Yes Dickie Begun, MD  hydrALAZINE  (APRESOLINE ) 10 MG tablet Take 1 tablet (10 mg total) by mouth 3 (three) times daily. Patient taking differently: Take 10 mg by mouth 2 (two) times daily. 11/12/19  Yes Dickie Begun, MD  losartan  (COZAAR ) 100 MG tablet Take 100 mg by mouth daily.   Yes [provider]  mirtazapine  (REMERON ) 30 MG tablet Take 30 mg by mouth at bedtime.   Yes [provider]  apixaban  (ELIQUIS ) 2.5 MG TABS tablet Take 1 tablet (2.5 mg total) by mouth 2 (two) times daily. Patient not taking: Reported on 05/10/2024 05/03/16   Sherial Bail, MD      VITAL SIGNS:  Blood pressure (!) 187/77, pulse 84, temperature 100.1 F (37.8 C), temperature source Oral, resp. rate (!) 29, height 4' 11 (1.499 m), weight 44.5 kg, SpO2 93%.  PHYSICAL EXAMINATION:  Physical Exam  GENERAL:  89 y.o.-year-old patient lying in the bed with no acute distress.  EYES: Pupils equal, round, reactive to light and accommodation. No scleral icterus. Extraocular muscles intact.  HEENT: Head atraumatic, normocephalic. Oropharynx and nasopharynx clear.  NECK:  Supple, no jugular venous distention. No thyroid enlargement, no tenderness.  LUNGS: Normal breath sounds bilaterally, no wheezing, rales,rhonchi or crepitation. No use of accessory muscles of respiration.  CARDIOVASCULAR: Regular rate and rhythm, S1, S2 normal. No murmurs, rubs, or gallops.  ABDOMEN: Soft, nondistended, with mild left lower quadrant abdominal tenderness without rebound tenderness guarding or rigidity.  Bowel sounds present. No organomegaly or mass.  EXTREMITIES: No pedal edema, cyanosis, or clubbing.  NEUROLOGIC: Cranial nerves II through XII are intact. Muscle strength 5/5 in all extremities. Sensation intact. Gait not checked.  PSYCHIATRIC: The patient is alert and oriented x 3.  Normal affect and good eye contact. SKIN: No obvious rash, lesion, or ulcer.   LABORATORY PANEL:    CBC Recent Labs  Lab 05/10/24 1556  WBC 5.1  HGB 11.1*  HCT 33.2*  PLT 132*   ------------------------------------------------------------------------------------------------------------------  Chemistries  Recent Labs  Lab 05/10/24 1556  NA 137  K 3.3*  CL 106  CO2 20*  GLUCOSE 97  BUN 16  CREATININE 1.12*  CALCIUM  8.4*  AST 23  ALT 11  ALKPHOS 67  BILITOT 1.1   ------------------------------------------------------------------------------------------------------------------  Cardiac Enzymes No results for input(s): TROPONINI in the last 168 hours. ------------------------------------------------------------------------------------------------------------------  RADIOLOGY:  US  ABDOMEN LIMITED RUQ (LIVER/GB) Result Date: 05/10/2024 CLINICAL DATA:  Right upper quadrant pain EXAM: ULTRASOUND ABDOMEN LIMITED RIGHT UPPER QUADRANT COMPARISON:  CT abdomen and pelvis 05/10/2024 FINDINGS: Gallbladder: Gallbladder wall is upper limits of normal in thickness at 3 mm. There is no pericholecystic fluid. No gallstones are identified. Sonographic Beverley sign is negative. Common bile duct: Diameter: 4.6 mm. Liver: There is coarse echotexture diffusely with nodular liver contour. Echotexture is diffusely increased. There is a 1.4 x 1.2 x 1.8 cm cystic area in the left lobe of the liver corresponding to CT findings. Portal vein is patent on color Doppler  imaging with normal direction of blood flow towards the liver. Other: None. IMPRESSION: 1. Cirrhotic liver morphology. 2. Gallbladder wall is upper limits of normal in thickness at 3 mm. No gallstones or pericholecystic fluid. Sonographic Beverley sign is negative. 3. 1.8 cm cystic area in the left lobe of the liver corresponding to CT findings. Electronically Signed   By: Greig Pique M.D.   On: 05/10/2024 23:04   CT ABDOMEN PELVIS W CONTRAST Result Date: 05/10/2024 CLINICAL DATA:  Acute diarrhea with weakness. EXAM: CT ABDOMEN AND PELVIS  WITH CONTRAST TECHNIQUE: Multidetector CT imaging of the abdomen and pelvis was performed using the standard protocol following bolus administration of intravenous contrast. RADIATION DOSE REDUCTION: This exam was performed according to the departmental dose-optimization program which includes automated exposure control, adjustment of the mA and/or kV according to patient size and/or use of iterative reconstruction technique. CONTRAST:  80mL OMNIPAQUE  IOHEXOL  300 MG/ML  SOLN COMPARISON:  CT abdomen 12/15/2009. CT abdomen and pelvis 12/04/2006. FINDINGS: Lower chest: There is new cardiomegaly with small pericardial effusion. There is a small right pleural effusion. Hepatobiliary: There is markedly heterogeneous appearance of liver parenchyma. Liver has nodular contour. This is a new finding. There is a new hypodense lesion in the peripheral right lobe of the liver measuring 14 mm. There is a new hypodense area in the left lobe of the liver measuring 12 mm. There is diffuse gallbladder wall edema. There is no biliary ductal dilatation. Pancreas: Unremarkable. No pancreatic ductal dilatation or surrounding inflammatory changes. Spleen: Normal in size without focal abnormality. Adrenals/Urinary Tract: Adrenal glands are unremarkable. Kidneys are normal, without renal calculi, focal lesion, or hydronephrosis. Bladder is unremarkable. Stomach/Bowel: Stomach is within normal limits. Appendix appears normal. There are air-fluid levels scattered throughout the colon. There is mild diffuse colonic wall thickening. Small bowel is within normal limits. Proximal stomach is decompressed. There is questionable large diverticula with air-fluid level versus dilated proximal small bowel loop in the right upper quadrant image 2/37. This appears unchanged from multiple prior examinations dating back to 1,008. No pneumatosis or free air. Vascular/Lymphatic: Aorta and IVC are normal in size. There are atherosclerotic calcifications of  the aorta. No enlarged lymph nodes are identified. Reproductive: Uterus and bilateral adnexa are unremarkable. Other: There is a small amount of free fluid throughout the abdomen and pelvis. Musculoskeletal: Severe L1 compression fracture is again seen. There is retropulsion of fracture fragments. No acute osseous findings. IMPRESSION: 1. New cardiomegaly with small pericardial effusion. 2. New small right pleural effusion. 3. New heterogeneous appearance of the liver with nodular contour worrisome for cirrhosis. 4. New hypodense lesions in the liver are indeterminate. Recommend further evaluation with MRI. 5. Diffuse gallbladder wall edema. Findings may be related to hepatic dysfunction, but cholecystitis not excluded. 6. Mild diffuse colonic wall thickening with air-fluid levels compatible with colitis. 7. Small amount of free fluid in the abdomen and pelvis. 8. Aortic atherosclerosis. Aortic Atherosclerosis (ICD10-I70.0). Electronically Signed   By: Greig Pique M.D.   On: 05/10/2024 21:47      IMPRESSION AND PLAN:  Assessment and Plan: * Acute colitis - The patient has subsequent sepsis as manifested by tachycardia and tachypnea. - She will be admitted to a medical telemetry bed. - Will continue antibiotic therapy with IV Rocephin  and Flagyl . - Will continue hydration with IV lactated ringer . - As needed antidiarrheals will be provided.   Hypertensive urgency - The patient will be placed on as needed IV hydralazine  and labetalol . - Will continue  antihypertensives.  Hypokalemia - Will replace potassium and check magnesium  level.  Atrial fibrillation, chronic (HCC) - She has controlled ventricular response. - Her Eliquis  was discontinued by her provider.  This is likely secondary to fall risk as she was seen for a fall on 9/5. - Will continue to monitor her rhythm and rate. - Will utilize IV Cardizem as needed.   DVT prophylaxis: Lovenox .  Advanced Care Planning:  Code Status: full  code.  Family Communication:  The plan of care was discussed in details with the patient (and family). I answered all questions. The patient agreed to proceed with the above mentioned plan. Further management will depend upon hospital course. Disposition Plan: Back to previous home environment Consults called: none.  All the records are reviewed and case discussed with ED provider.  Status is: Inpatient  At the time of the admission, it appears that the appropriate admission status for this patient is inpatient.  This is judged to be reasonable and necessary in order to provide the required intensity of service to ensure the patient's safety given the presenting symptoms, physical exam findings and initial radiographic and laboratory data in the context of comorbid conditions.  The patient requires inpatient status due to high intensity of service, high risk of further deterioration and high frequency of surveillance required.  I certify that at the time of admission, it is my clinical judgment that the patient will require inpatient hospital care extending more than 2 midnights.                            Dispo: The patient is from: Home              Anticipated d/c is to: Home              Patient currently is not medically stable to d/c.              Difficult to place patient: No  Madison DELENA Peaches M.D on 05/11/2024 at 12:32 AM  [facility]   From 7 PM-7 AM, contact night-coverage [email or web address]  CC: Primary care physician; Salina Clarity, NP

## 2024-05-11 NOTE — ED Notes (Signed)
 This tech and Niels EDT changed pt after urinary and BM incontinence. Linen change and peri care were performed. Pt was readjusted in the bed and given warm blankets. Fall bundle in place.

## 2024-05-12 ENCOUNTER — Encounter: Payer: Self-pay | Admitting: Family Medicine

## 2024-05-12 DIAGNOSIS — Z515 Encounter for palliative care: Secondary | ICD-10-CM

## 2024-05-12 DIAGNOSIS — K529 Noninfective gastroenteritis and colitis, unspecified: Secondary | ICD-10-CM | POA: Diagnosis not present

## 2024-05-12 DIAGNOSIS — Z7189 Other specified counseling: Secondary | ICD-10-CM

## 2024-05-12 LAB — BASIC METABOLIC PANEL WITH GFR
Anion gap: 9 (ref 5–15)
BUN: 11 mg/dL (ref 8–23)
CO2: 23 mmol/L (ref 22–32)
Calcium: 7.8 mg/dL — ABNORMAL LOW (ref 8.9–10.3)
Chloride: 105 mmol/L (ref 98–111)
Creatinine, Ser: 0.57 mg/dL (ref 0.44–1.00)
GFR, Estimated: >60 mL/min (ref >60)
Glucose, Bld: 68 mg/dL — ABNORMAL LOW (ref 70–99)
Potassium: 3.3 mmol/L — ABNORMAL LOW (ref 3.5–5.1)
Sodium: 137 mmol/L (ref 135–145)

## 2024-05-12 LAB — CBC
HCT: 32.2 % — ABNORMAL LOW (ref 36.0–46.0)
Hemoglobin: 11 g/dL — ABNORMAL LOW (ref 12.0–15.0)
MCH: 34.2 pg — ABNORMAL HIGH (ref 26.0–34.0)
MCHC: 34.2 g/dL (ref 30.0–36.0)
MCV: 100 fL (ref 80.0–100.0)
Platelets: 123 K/uL — ABNORMAL LOW (ref 150–400)
RBC: 3.22 MIL/uL — ABNORMAL LOW (ref 3.87–5.11)
RDW: 13.5 % (ref 11.5–15.5)
WBC: 5.3 K/uL (ref 4.0–10.5)
nRBC: 0 % (ref 0.0–0.2)

## 2024-05-12 LAB — RETICULOCYTES
Immature Retic Fract: 19.5 % — ABNORMAL HIGH (ref 2.3–15.9)
RBC.: 3.21 MIL/uL — ABNORMAL LOW (ref 3.87–5.11)
Retic Count, Absolute: 50.7 K/uL (ref 19.0–186.0)
Retic Ct Pct: 1.6 % (ref 0.4–3.1)

## 2024-05-12 LAB — IRON AND TIBC
Iron: 56 ug/dL (ref 28–170)
Saturation Ratios: 22 % (ref 10.4–31.8)
TIBC: 253 ug/dL (ref 250–450)
UIBC: 197 ug/dL

## 2024-05-12 LAB — FOLATE: Folate: 14.8 ng/mL

## 2024-05-12 LAB — VITAMIN B12: Vitamin B-12: 434 pg/mL (ref 180–914)

## 2024-05-12 LAB — GLUCOSE, CAPILLARY: Glucose-Capillary: 100 mg/dL — ABNORMAL HIGH (ref 70–99)

## 2024-05-12 LAB — FERRITIN: Ferritin: 98 ng/mL (ref 11–307)

## 2024-05-12 MED ORDER — ADULT MULTIVITAMIN W/MINERALS CH
1.0000 | ORAL_TABLET | Freq: Every day | ORAL | Status: DC
  Administered 2024-05-12 – 2024-05-15 (×4): 1 via ORAL
  Filled 2024-05-12 (×4): qty 1

## 2024-05-12 MED ORDER — BOOST / RESOURCE BREEZE PO LIQD CUSTOM
1.0000 | Freq: Three times a day (TID) | ORAL | Status: DC
  Administered 2024-05-12 – 2024-05-15 (×2): 1 via ORAL

## 2024-05-12 MED ORDER — KCL IN DEXTROSE-NACL 20-5-0.9 MEQ/L-%-% IV SOLN
INTRAVENOUS | Status: AC
  Filled 2024-05-12 (×2): qty 1000

## 2024-05-12 NOTE — Progress Notes (Signed)
 Initial Nutrition Assessment  DOCUMENTATION CODES:   Severe malnutrition in context of acute illness/injury  INTERVENTION:   -Liberalize diet to regular for widest variety of meal selections -Boost Breeze po TID, each supplement provides 250 kcal and 9 grams of protein  -MVI with minerals daily  NUTRITION DIAGNOSIS:   Severe Malnutrition related to acute illness as evidenced by energy intake < or equal to 50% for > or equal to 5 days, percent weight loss, mild fat depletion, mild muscle depletion, moderate muscle depletion.  GOAL:   Patient will meet greater than or equal to 90% of their needs  MONITOR:   PO intake, Supplement acceptance  REASON FOR ASSESSMENT:   Consult Assessment of nutrition requirement/status  ASSESSMENT:   Pt with PMH of essential hypertension, osteoarthritis, and chronic atrial fibrillation not currently on Eliquis  per medical advice, who presented with the onset of diarrhea over the last couple days with associated generalized weakness and fever and chills w/o nausea or vomiting or abdominal pain.  Pt admitted with acute colitis.   Reviewed I/O's: +1.8 L x 24 hours and +3 L since admission  UOP: 725 ml x 24 hours  Per MD notes, abnormal CT worrisome for cirrhosis and liver lesion declining further workup.   Spoke with pt at bedside, who was pleasant and in good spirits today. Pt shares that it is both her and her daughter's birthday today. Pt frustrated due to being in the hospital on her birthday; emotional support provided.   Pt reports that she has lived at [facility] assisted living for approximately 1 year. She reports she has never been a big eater. She generally consumes 1 meal per day (lunch) which includes a meat, starch, vegetables, and drink. Pt shares that she does not drink of snack throughout the day. Pt has tried Ensure in the past, but she hates it because it is too thick.   Pt shares that she has experienced progressive weight  loss over the past year, approximately 20-30#. Reviewed wt hx; pt has experienced a 9.6% wt loss over the past week, which is significant for time frame.   Highly suspect pt with malnutrition at baseline, however, worsened from acute illness.   Discussed importance of good meal and supplement intake to promote healing. Pt reluctantly agreeable to Boost Breeze.   Recommendations discussed with RN.   Palliative care following for goals of care discussions. Plan to allow time for outcomes.   Medications reviewed and include lovenox , apresoline , remeron , and dextrose  5% and 0.9% NaCl with KCl 20 mEq/L infusion @ 40 ml/hr.   Labs reviewed: K: 3.3, CBGS: 100 (inpatient orders for glycemic control are none).    NUTRITION - FOCUSED PHYSICAL EXAM:  Flowsheet Row Most Recent Value  Orbital Region Mild depletion  Upper Arm Region Moderate depletion  Thoracic and Lumbar Region Mild depletion  Buccal Region Moderate depletion  Temple Region Mild depletion  Clavicle Bone Region Mild depletion  Clavicle and Acromion Bone Region Mild depletion  Scapular Bone Region Mild depletion  Dorsal Hand Moderate depletion  Patellar Region Moderate depletion  Anterior Thigh Region Moderate depletion  Posterior Calf Region Moderate depletion  Edema (RD Assessment) None  Hair Reviewed  Eyes Reviewed  Mouth Reviewed  Skin Reviewed  Nails Reviewed    Diet Order:   Diet Order             Diet regular Fluid consistency: Thin  Diet effective now  EDUCATION NEEDS:   Education needs have been addressed  Skin:  Skin Assessment: Reviewed RN Assessment  Last BM:  05/11/24 (type 6)  Height:   Ht Readings from Last 1 Encounters:  05/10/24 4' 11 (1.499 m)    Weight:   Wt Readings from Last 1 Encounters:  05/10/24 44.5 kg    Ideal Body Weight:  44.7 kg  BMI:  Body mass index is 19.79 kg/m.  Estimated Nutritional Needs:   Kcal:  1350-1550  Protein:  65-80  grams  Fluid:  1.3-1.5 L    Margery ORN, RD, LDN, CDCES Registered Dietitian III Certified Diabetes Care and Education Specialist If unable to reach this RD, please use RD Inpatient group chat on secure chat between hours of 8am-4 pm daily

## 2024-05-12 NOTE — Progress Notes (Signed)
 PROGRESS NOTE Yvette [name]  FMW:[id number] DOB: May 29, 1924 DOA: 05/10/2024 PCP: Salina Clarity, NP  Brief Narrative/Hospital Course: Yvette [name] is a 89 y.o. female with PMH of essential hypertension, osteoarthritis, and chronic atrial fibrillation not currently on Eliquis  per medical advice, who presented to the ED with the onset of diarrhea over the last couple days with associated generalized weakness and fever and chills w/o nausea or vomiting or abdominal pain. In ED: Low-grade temp 100.1 and RR 21>30s. heart rate of 86 and later 106 and pulse oximetry of 98% on room air. Labs revealed hypokalemia 3.3 and CO2 of 20 with calcium  8.4 and creatinine 1.12.  CBC showed mild anemia with hemoglobin 11.1 hematocrit 33.2 close to baseline and thrombocytopenia of 132.  Respiratory panel came back negative.  UA was unremarkable. EKG >atrial fibrillation with controlled ventricular response 76 with incomplete right bundle branch block.  It showed Q waves septally. CT Abdominal and pelvic W/: New cardiomegaly with small pericardial effusion.New small right pleural effusion. New heterogeneous appearance of the liver with nodular contour worrisome for cirrhosis. New hypodense lesions in the liver are indeterminate. Recommend further evaluation with MRI. Diffuse gallbladder wall edema. Findings may be related to hepatic dysfunction, but cholecystitis not excluded. Mild diffuse colonic wall thickening with air-fluid levels compatible with colitis. Small amount of free fluid in the abdomen and pelvis.Aortic atherosclerosis.  The patient was given 1 L bolus of IV lactated Ringer  AND admitted for further workup on ceftriaxone  and Flagyl .   Subjective: Seen and examined At the bedside Resting comfortably no complaint, She had episode of diarrhea yesterday but nursing unable to collect sample Overnight afebrile BP in 140s-160s, on room air Labs reviewed potassium 3.3 blood glucose 68 anemia panel stable ferritin  folate She is very hard of hearing  Assessment and plan:   Diarrhea times few days with weakness and fever Acute colitis per CT finding Sepsis POA due to colitis: No abdominal pain but presenting with diarrhea CT findings noted as above.Patient w/  tachycardic tachypneic meeting sepsis criteria on admission.  Overall clinically improving, advance diet to soft diet and to regular diet later today if tolerating.   continue ceftriaxone , Flagyl  and stool studies as able- c diff and gip-no sample yet.  Loss of appetite for few months Weight loss about 15 lb in past year Hypoglycemia: RD consulted.Palliative medicine consulted.Confirmed DNR status with POA Patient at risk for decompensation high risk for readmission-daughter is agreeable for palliative care and understands that if she does not improve but she may be candidate for hospice Encourage hydration-wean of IV fluids once p.o. adequate  Abnormal CT finding Worrisome for cirrhosis and Liver lesion in CT: needs nonemergent MRI likely before discharge if family wants to proceed - will benefit with outpatient GI follow-up. Family-daughter does not want to proceed with further or given her age.   Small pericardial effusion and small right pleural effusion: Asymptomatic patient will need close follow-up with chest x-ray and echocardiogram as outpatient.  No cardiac or respiratory complaints currently.  Daughter not interested to pursue further workup  Hypertensive urgency: Blood pressure now well-controlled continue oral meds including as needed. Cont home clonidine  hydralazine  and losartan    Mild AKI and hyperkalemia: Resolved .  Potassium slightly 3.3 will replace   Atrial fibrillation, chronic: Rate controlled.Her Eliquis  was discontinued by her provider likely secondary to fall risk as she was seen for a fall on 9/5. Will continue to monitor her rhythm  Malnutrition moderate Body mass index is 19.79 kg/m.:  Augment  diet Nutrition Status:           Macrocytic anemia: Anemia panel stable  Mobility: PT OT consulted  DVT prophylaxis: enoxaparin  (LOVENOX ) injection 30 mg Start: 05/11/24 0800 Code Status:   Code Status: Limited: Do not attempt resuscitation (DNR) -DNR-LIMITED -Do Not Intubate/DNI  she is DNR as per daughter. Family Communication: plan of care discussed with patient at bedside.Daughter  was updated  at bedside Patient status is: Remains hospitalized because of severity of illness. Level of care: Telemetry Medical   Dispo: The patient is from: ILF            Anticipated disposition: Skilled nursing facility in 1 to 2 days once available   Objective: Vitals last 24 hrs: Vitals:   05/11/24 2016 05/12/24 0020 05/12/24 0511 05/12/24 0800  BP: (!) 174/98 (!) 165/89 (!) 148/92 (!) 150/70  Pulse: 79 87 68 93  Resp: 16 16 19 18   Temp: 98.1 F (36.7 C)  97.8 F (36.6 C) 98 F (36.7 C)  TempSrc: Oral     SpO2: 97% 96% 95% 97%  Weight:      Height:        Physical Examination: General exam: alert awake, oriented to self, people HOH HEENT:Oral mucosa moist, Ear/Nose WNL grossly Respiratory system: Bilaterally clear BS,no use of accessory muscle Cardiovascular system: S1 & S2 +, No JVD. Gastrointestinal system: Abdomen soft, non- tender ,ND, BS+ Nervous System: Alert, awake, moving all extremities,and following commands. Extremities: extremities warm, leg edema neg Skin: No rashes,no icterus. MSK: Normal muscle bulk,tone, power   Medications reviewed:  Scheduled Meds:  cloNIDine   0.1 mg Oral BID   enoxaparin  (LOVENOX ) injection  30 mg Subcutaneous Q24H   hydrALAZINE   10 mg Oral BID   losartan   100 mg Oral Daily   mirtazapine   30 mg Oral QHS   Continuous Infusions:  cefTRIAXone  (ROCEPHIN )  IV Stopped (05/12/24 0040)   dextrose  5 % and 0.9 % NaCl with KCl 20 mEq/L 40 mL/hr at 05/12/24 1029   metronidazole  Stopped (05/12/24 0243)   Diet: Diet Order             DIET  SOFT Room service appropriate? Yes; Fluid consistency: Thin  Diet effective now                    Data Reviewed: I have personally reviewed following labs and imaging studies ( see epic result tab) CBC: Recent Labs  Lab 05/10/24 1556 05/11/24 0437 05/12/24 0540  WBC 5.1 6.8 5.3  HGB 11.1* 10.8* 11.0*  HCT 33.2* 32.9* 32.2*  MCV 102.8* 102.2* 100.0  PLT 132* 121* 123*   CMP: Recent Labs  Lab 05/10/24 1556 05/11/24 0437 05/12/24 0540  NA 137 139 137  K 3.3* 3.8 3.3*  CL 106 103 105  CO2 20* 19* 23  GLUCOSE 97 83 68*  BUN 16 12 11   CREATININE 1.12* 0.72 0.57  CALCIUM  8.4* 8.1* 7.8*  MG 1.7  --   --    GFR: Estimated Creatinine Clearance: 26.8 mL/min (by C-G formula based on SCr of 0.57 mg/dL). Recent Labs  Lab 05/10/24 1556  AST 23  ALT 11  ALKPHOS 67  BILITOT 1.1  PROT 7.4  ALBUMIN 3.8   No results for input(s): LIPASE, AMYLASE in the last 168 hours. No results for input(s): AMMONIA in the last 168 hours. Coagulation Profile:  Recent Labs  Lab 05/11/24 0437  INR 1.4*   Unresulted Labs (From admission, onward)  Start     Ordered   05/12/24 0500  Basic metabolic panel with GFR  Daily,   R      05/11/24 0844   05/12/24 0500  CBC  Daily,   R      05/11/24 0844   05/11/24 0845  Gastrointestinal Panel by PCR , Stool  (Gastrointestinal Panel by PCR, Stool                                                                                                                                                     **Does Not include CLOSTRIDIUM DIFFICILE testing. **If CDIFF testing is needed, place order from the C Difficile Testing order set.**)  Once,   R        05/11/24 0844   05/11/24 0844  C Difficile Quick Screen w PCR reflex  (C Difficile quick screen w PCR reflex panel )  Once, for 24 hours,   TIMED       References:    CDiff Information Tool   05/11/24 0844           Antimicrobials/Microbiology: Anti-infectives (From admission, onward)    Start      Dose/Rate Route Frequency Ordered Stop   05/11/24 0015  cefTRIAXone  (ROCEPHIN ) 2 g in sodium chloride  0.9 % 100 mL IVPB        2 g 200 mL/hr over 30 Minutes Intravenous Every 24 hours 05/11/24 0006 05/18/24 0014   05/11/24 0015  metroNIDAZOLE  (FLAGYL ) IVPB 500 mg        500 mg 100 mL/hr over 60 Minutes Intravenous Every 12 hours 05/11/24 0006 05/18/24 0014         Component Value Date/Time   SDES  11/23/2019 1615    URINE, RANDOM Performed at [facility], [address] [place] [place], [place] [address]    [facility]  11/23/2019 1615    NONE Performed at [facility], [address] [place], [place] [address]    CULT >=100,000 COLONIES/mL ESCHERICHIA COLI (A) 11/23/2019 1615   REPTSTATUS 11/25/2019 FINAL 11/23/2019 1615    Procedures:    Yvette LAMY, MD [facility] 05/12/2024, 1:23 PM

## 2024-05-12 NOTE — Consult Note (Signed)
 Consultation Note Date: 05/12/2024   Patient Name: Yvette [name]  DOB: [birth year]  MRN: [id number]  Age / Sex: 89 y.o., female  PCP: Salina Clarity, NP Referring Physician: Christobal Guadalajara, MD  Reason for Consultation: Establishing goals of care  HPI/Patient Profile: 89 y.o. female  with past medical history of hypertension, osteoarthritis, and chronic atrial fibrillation not currently on Eliquis  secondary to falls admitted on 05/10/2024 with diarrhea/acute colitis with sepsis prior to arrival and abnormal CT worrisome for cirrhosis and liver lesion declining further workup.   Clinical Assessment and Goals of Care: I have reviewed medical records including EPIC notes, labs and imaging, received report from RN, assessed the patient.  Mrs. Geron is sitting up in the [place] chair in her room.  She appears elderly and ill, but not frail or toxic.  She greets me, making and somewhat keeping eye contact.  She is alert and oriented, able to make her needs known.  Her daughter/HCPOA, Randall, is present at bedside.  Today is both of their birthdays, birthday wishes offered.   We meet  o discuss diagnosis prognosis, GOC, EOL wishes, disposition and options.  I introduced Palliative Medicine as specialized medical care for people living with serious illness. It focuses on providing relief from the symptoms and stress of a serious illness. The goal is to improve quality of life for both the patient and the family.  We discussed a brief life review of the patient.  Mrs. Caison is a widow, her husband died in 2016-05-31.  She worked as a Associate Professor.  She has 2 children, Randall and a son.  Randall and her brother share Airline pilot.  Mrs. Suchocki moved into independent living after her husband died, and moved to [facility] independent living approximately 3 years ago.  Around May of this year at [facility] elected to have a paid caregiver  available twice a day for medication administration.  She endorses that they may need more services soon.  We then focused on their current illness.  We talked about colitis and the treatment plan.  Mrs. Fronie states that she has not had a bowel movement since yesterday evening.  We talked about IV fluids and medications.  We talked about time for outcomes, test results.  We review selected labs.  Encouragement offered.  We talked about 15 pound weight loss over the last year, liver lesion on CT.  Daughter Randall states that they have decided to not discuss this with Mrs. Molinaro and are requesting no further workup.  The natural disease trajectory and expectations at EOL were discussed.  I attempted to elicit values and goals of care important to the patient.    Advanced directives, concepts specific to code status, artifical feeding and hydration, and rehospitalization were considered and discussed.  DNR endorsed.  No further workup for suspected liver lesion, no surgeries or invasive testing/procedures.  Hospice and Palliative Care services outpatient were explained and offered.  We talk in detail about the benefits of outpatient palliative and hospice services.  Provider choice  offered, they choose [facility].  Randall readily accepts palliative and hospice care.  She states that she would prefer hospice services, but understands that she may need to start with palliative services if Mrs. Gilmartin qualifies for and accepts rehab.  Discussed the importance of continued conversation with family and the medical providers regarding overall plan of care and treatment options, ensuring decisions are within the context of the patient's values and GOCs.  Questions and concerns were addressed.  The family was encouraged to call with questions or concerns.  PMT will continue to support holistically.  Conference with attending, bedside nursing staff, transition of care team, and house [facility] representative related to  patient condition, needs, goals of care, disposition.   HCPOA HCPOA - daughter, Randall Blush is HCPOA, but involves her brother and decision making    SUMMARY OF RECOMMENDATIONS   Continue to treat the treatable but no CPR or intubation Time for outcomes Short-term rehab if needed/qualified but would prefer to return to [facility] independent living where she has been for 3 years Treat the treatable hospice care with [facility], understands may need to start with palliative   Code Status/Advance Care Planning: DNR  Symptom Management:  Per hospitalist, no additional needs at this time.  Palliative Prophylaxis:  Frequent Pain Assessment and Oral Care  Additional Recommendations (Limitations, Scope, Preferences): Continue to treat but no CPR or intubation, no workup for possible liver lesion  Psycho-social/Spiritual:  Desire for further Chaplaincy support:no Additional Recommendations: Caregiving  Support/Resources and Education on Hospice  Prognosis:  < 12 months, would be anticipated based on advanced age, decreasing functional status, possible liver lesion.  Randall seems knowledgeable and reasonable about her mothers life span stating that if she has 3 months 6 months 2 years, Randall wants it to be the best it can be.  Discharge Planning: To be determined, based on qualifications, would prefer to return to [facility] ALF where she has been for 3 years, but would accept short-term rehab.       Primary Diagnoses: Present on Admission:  Acute colitis  Atrial fibrillation, chronic (HCC)   I have reviewed the medical record, interviewed the patient and family, and examined the patient. The following aspects are pertinent.  Past Medical History:  Diagnosis Date   Arthritis    Cystocele    midline   Hypertension    Low sodium levels    Osteoporosis    Procidentia of uterus    Social History   Socioeconomic History   Marital status: Widowed    Spouse name: Not on file    Number of children: Not on file   Years of education: Not on file   Highest education level: Not on file  Occupational History   Occupation: retired  Tobacco Use   Smoking status: Never   Smokeless tobacco: Never  Substance and Sexual Activity   Alcohol use: No   Drug use: No   Sexual activity: Never  Other Topics Concern   Not on file  Social History Narrative   Not on file   Social Drivers of Health   Financial Resource Strain: Not on file  Food Insecurity: Patient Unable To Answer (05/11/2024)   Hunger Vital Sign    Worried About Running Out of Food in the Last Year: Patient unable to answer    Ran Out of Food in the Last Year: Patient unable to answer  Transportation Needs: Patient Unable To Answer (05/11/2024)   PRAPARE - Transportation    Lack  of Transportation (Medical): Patient unable to answer    Lack of Transportation (Non-Medical): Patient unable to answer  Physical Activity: Not on file  Stress: Not on file  Social Connections: Patient Unable To Answer (05/11/2024)   Social Connection and Isolation Panel    Frequency of Communication with Friends and Family: Patient unable to answer    Frequency of Social Gatherings with Friends and Family: Patient unable to answer    Attends Religious Services: Patient unable to answer    Active Member of Clubs or Organizations: Patient unable to answer    Attends Banker Meetings: Patient unable to answer    Marital Status: Patient unable to answer   Family History  Problem Relation Age of Onset   Breast cancer Maternal Aunt    Breast cancer Paternal Aunt    Ovarian cancer Neg Hx    Diabetes Neg Hx    Scheduled Meds:  cloNIDine   0.1 mg Oral BID   enoxaparin  (LOVENOX ) injection  30 mg Subcutaneous Q24H   hydrALAZINE   10 mg Oral BID   losartan   100 mg Oral Daily   mirtazapine   30 mg Oral QHS   Continuous Infusions:  cefTRIAXone  (ROCEPHIN )  IV Stopped (05/12/24 0040)   dextrose  5 % and 0.9 % NaCl with KCl  20 mEq/L 40 mL/hr at 05/12/24 1029   metronidazole  Stopped (05/12/24 0243)   PRN Meds:.acetaminophen  **OR** acetaminophen , hydrALAZINE , ondansetron  **OR** ondansetron  (ZOFRAN ) IV, traZODone  Medications Prior to Admission:  Prior to Admission medications   Medication Sig Start Date End Date Taking? Authorizing Provider  calcium  citrate-vitamin D  (CITRACAL+D) 315-200 MG-UNIT tablet Take 1 tablet by mouth daily.    Yes [provider]  cloNIDine  (CATAPRES ) 0.1 MG tablet Take 1 tablet (0.1 mg total) by mouth 2 (two) times daily. 11/12/19  Yes Dickie Begun, MD  hydrALAZINE  (APRESOLINE ) 10 MG tablet Take 1 tablet (10 mg total) by mouth 3 (three) times daily. Patient taking differently: Take 10 mg by mouth 2 (two) times daily. 11/12/19  Yes Dickie Begun, MD  losartan  (COZAAR ) 100 MG tablet Take 100 mg by mouth daily.   Yes [provider]  mirtazapine  (REMERON ) 30 MG tablet Take 30 mg by mouth at bedtime.   Yes [provider]  apixaban  (ELIQUIS ) 2.5 MG TABS tablet Take 1 tablet (2.5 mg total) by mouth 2 (two) times daily. Patient not taking: Reported on 05/10/2024 05/03/16   Sherial Bail, MD   Allergies  Allergen Reactions   Metoprolol  Swelling    In legs In legs    Amlodipine Other (See Comments)    edema   Hydrocodone-Acetaminophen  Nausea Only   Review of Systems  Unable to perform ROS: Age    Physical Exam Vitals and nursing note reviewed.  Constitutional:      General: She is not in acute distress.    Appearance: She is not ill-appearing.  Cardiovascular:     Rate and Rhythm: Normal rate.  Pulmonary:     Effort: Pulmonary effort is normal. No respiratory distress.  Musculoskeletal:        General: No swelling.  Skin:    General: Skin is warm and dry.  Neurological:     Mental Status: She is alert.  Psychiatric:        Mood and Affect: Mood normal.        Behavior: Behavior normal.     Vital Signs: BP (!) 150/70 (BP Location: Right Arm)    Pulse 93   Temp 98 F (  36.7 C)   Resp 18   Ht 4' 11 (1.499 m)   Wt 44.5 kg   SpO2 97%   BMI 19.79 kg/m  Pain Scale: 0-10 POSS *See Group Information*: 1-Acceptable,Awake and alert Pain Score: 0-No pain   SpO2: SpO2: 97 % O2 Device:SpO2: 97 % O2 Flow Rate: .   IO: Intake/output summary:  Intake/Output Summary (Last 24 hours) at 05/12/2024 1316 Last data filed at 05/12/2024 1028 Gross per 24 hour  Intake 2961.69 ml  Output 725 ml  Net 2236.69 ml    LBM: Last BM Date : 05/11/24 Baseline Weight: Weight: 49 kg Most recent weight: Weight: 44.5 kg     Palliative Assessment/Data:     Time In: 1000 Time Out: 1115 Time Total: 75 minutes Greater than 50%  of this time was spent counseling and coordinating care related to the above assessment and plan.  Signed by: Lorenza DELENA Birkenhead, NP   Please contact Palliative Medicine Team phone at [phone] for questions and concerns.  For individual provider: See Tracey

## 2024-05-12 NOTE — Progress Notes (Signed)
 Mobility Specialist Progress Note:    05/12/24 1201  Mobility  Activity Ambulated with assistance  Level of Assistance Minimal assist, patient does 75% or more  Assistive Device Front wheel walker  Distance Ambulated (ft) 160 ft  Range of Motion/Exercises Active;All extremities  Activity Response Tolerated well  Mobility visit 1 Mobility  Mobility Specialist Start Time (ACUTE ONLY) 1141  Mobility Specialist Stop Time (ACUTE ONLY) 1158  Mobility Specialist Time Calculation (min) (ACUTE ONLY) 17 min   Pt agreeable to mobility, required MinA to stand and ambulate with RW. Tolerated well, pt fatigued at EOS. Returned to chair, belongings in reach and visitor at bedside. All needs met.  Sherrilee Ditty Mobility Specialist Please contact via Special educational needs teacher or  Rehab office at [phone]

## 2024-05-12 NOTE — Progress Notes (Signed)
 [facility] Room 152 [facility] Liaison Note  Received a referral for palliative care follow up at a SNF when a bed has been found.    Palliative Care referral submitted today.  Please call with any palliative care questions or concerns.  Thank you for the opportunity to participate in this patient's care  [facility] Liaison [phone]

## 2024-05-12 NOTE — TOC Initial Note (Signed)
 Transition of Care [facility]) - Initial/Assessment Note    Patient Details  Name: Yvette [name] MRN: [id number] Date of Birth: May 15, 1924  Transition of Care [facility]) CM/SW Contact:    Alvaro Louder, LCSW Phone Number: 05/12/2024, 3:25 PM  Clinical Narrative:   Per Chart review patient from [facility] independent living. LCSWA reached out to [facility] to see if patient could admit back. They indicated that she would have to get some rehab before admitting back to the facility. LCSWA completed FL2 and faxed out information to SNF's in [place]. [facility] will follow at SNF when bed is found.      TOC to follow for discharge       Patient Goals and CMS Choice            Expected Discharge Plan and Services                                              Prior Living Arrangements/Services                       Activities of Daily Living      Permission Sought/Granted                  Emotional Assessment              Admission diagnosis:  Acute colitis [K52.9] Generalized weakness [R53.1] Patient Active Problem List   Diagnosis Date Noted   Hypertensive urgency 05/11/2024   Hypokalemia 05/11/2024   Acute colitis 05/10/2024   Atrial fibrillation, chronic (HCC) 11/03/2019   Grief 06/20/2016   Hyponatremia 04/29/2016   Lower urinary tract infectious disease 04/29/2016   Osteoporosis, post-menopausal 03/25/2015   Procidentia of uterus 03/25/2015   Cystocele 03/25/2015   Absolute anemia 12/14/2014   Arthritis 12/14/2014   HLD (hyperlipidemia) 12/14/2014   BP (high blood pressure) 12/14/2014   Back ache 04/20/2014   PCP:  Salina Clarity, NP Pharmacy:   [facility] [place], [place] - [address] [place] [address] Phone: [phone] Fax: [phone]     Social Drivers of Health (SDOH) Social History: SDOH Screenings   Food Insecurity: Patient Unable To Answer (05/11/2024)  Housing: Patient Unable To Answer  (05/11/2024)  Transportation Needs: Patient Unable To Answer (05/11/2024)  Utilities: Patient Unable To Answer (05/11/2024)  Social Connections: Patient Unable To Answer (05/11/2024)  Tobacco Use: Low Risk  (05/12/2024)   SDOH Interventions:     Readmission Risk Interventions     No data to display

## 2024-05-12 NOTE — NC FL2 (Cosign Needed)
 [place]  MEDICAID FL2 LEVEL OF CARE FORM     IDENTIFICATION  Patient Name: Yvette [name] Birthdate: 1923/12/19 Sex: female Admission Date (Current Location): 05/10/2024  [facility] and [place] Number:  Chiropodist and Address:  [facility], [address], [place], [place] [address]      Provider Number: [id number]  Attending Physician Name and Address:  Christobal Guadalajara, MD  Relative Name and Phone Number:       Current Level of Care: Hospital Recommended Level of Care: Skilled Nursing Facility Prior Approval Number:    Date Approved/Denied:   PASRR Number:    Discharge Plan: SNF    Current Diagnoses: Patient Active Problem List   Diagnosis Date Noted   Hypertensive urgency 05/11/2024   Hypokalemia 05/11/2024   Acute colitis 05/10/2024   Atrial fibrillation, chronic (HCC) 11/03/2019   Grief 06/20/2016   Hyponatremia 04/29/2016   Lower urinary tract infectious disease 04/29/2016   Osteoporosis, post-menopausal 03/25/2015   Procidentia of uterus 03/25/2015   Cystocele 03/25/2015   Absolute anemia 12/14/2014   Arthritis 12/14/2014   HLD (hyperlipidemia) 12/14/2014   BP (high blood pressure) 12/14/2014   Back ache 04/20/2014    Orientation RESPIRATION BLADDER Height & Weight     Self, Time, Situation, Place  Normal Continent Weight: 98 lb (44.5 kg) Height:  4' 11 (149.9 cm)  BEHAVIORAL SYMPTOMS/MOOD NEUROLOGICAL BOWEL NUTRITION STATUS      Incontinent Diet (Regular)  AMBULATORY STATUS COMMUNICATION OF NEEDS Skin   Limited Assist Verbally Normal                       Personal Care Assistance Level of Assistance  Bathing, Feeding, Dressing Bathing Assistance: Limited assistance Feeding assistance: Independent Dressing Assistance: Limited assistance     Functional Limitations Info  Hearing, Sight, Speech Sight Info: Adequate Hearing Info: Impaired Speech Info: Adequate    SPECIAL CARE FACTORS FREQUENCY  PT (By licensed  PT), OT (By licensed OT)     PT Frequency: 5x/week OT Frequency: 5x/week            Contractures      Additional Factors Info  Code Status, Allergies Code Status Info: DNR Limited Allergies Info: Acute colitis, Generalized weakness, Weakness           Current Medications (05/12/2024):  This is the current hospital active medication list Current Facility-Administered Medications  Medication Dose Route Frequency Provider Last Rate Last Admin   acetaminophen  (TYLENOL ) tablet 650 mg  650 mg Oral Q6H PRN Mansy, Jan A, MD       Or   acetaminophen  (TYLENOL ) suppository 650 mg  650 mg Rectal Q6H PRN Mansy, Jan A, MD       cefTRIAXone  (ROCEPHIN ) 2 g in sodium chloride  0.9 % 100 mL IVPB  2 g Intravenous Q24H Mansy, Jan A, MD   Stopped at 05/12/24 0040   cloNIDine  (CATAPRES ) tablet 0.1 mg  0.1 mg Oral BID Mansy, Jan A, MD   0.1 mg at 05/12/24 1015   dextrose  5 % and 0.9 % NaCl with KCl 20 mEq/L infusion   Intravenous Continuous Kc, Ramesh, MD 40 mL/hr at 05/12/24 1029 New Bag at 05/12/24 1029   enoxaparin  (LOVENOX ) injection 30 mg  30 mg Subcutaneous Q24H Mansy, Jan A, MD   30 mg at 05/12/24 1012   feeding supplement (BOOST / RESOURCE BREEZE) liquid 1 Container  1 Container Oral TID BM Christobal Guadalajara, MD  hydrALAZINE  (APRESOLINE ) injection 5 mg  5 mg Intravenous Q6H PRN Kc, Ramesh, MD       hydrALAZINE  (APRESOLINE ) tablet 10 mg  10 mg Oral BID Mansy, Jan A, MD   10 mg at 05/12/24 1014   losartan  (COZAAR ) tablet 100 mg  100 mg Oral Daily Kc, Ramesh, MD   100 mg at 05/12/24 1014   metroNIDAZOLE  (FLAGYL ) IVPB 500 mg  500 mg Intravenous Q12H Mansy, Jan A, MD 100 mL/hr at 05/12/24 1349 500 mg at 05/12/24 1349   mirtazapine  (REMERON ) tablet 30 mg  30 mg Oral QHS Mansy, Jan A, MD   30 mg at 05/11/24 2214   multivitamin with minerals tablet 1 tablet  1 tablet Oral Daily Kc, Ramesh, MD       ondansetron  (ZOFRAN ) tablet 4 mg  4 mg Oral Q6H PRN Mansy, Jan A, MD       Or   ondansetron  (ZOFRAN )  injection 4 mg  4 mg Intravenous Q6H PRN Mansy, Jan A, MD       traZODone  (DESYREL ) tablet 25 mg  25 mg Oral QHS PRN Mansy, Madison LABOR, MD         Discharge Medications: Please see discharge summary for a list of discharge medications.  Relevant Imaging Results:  Relevant Lab Results:   Additional Information SSN: [id number]  Alvaro Louder, LCSW

## 2024-05-13 DIAGNOSIS — R531 Weakness: Secondary | ICD-10-CM

## 2024-05-13 DIAGNOSIS — K529 Noninfective gastroenteritis and colitis, unspecified: Secondary | ICD-10-CM | POA: Diagnosis not present

## 2024-05-13 DIAGNOSIS — I160 Hypertensive urgency: Secondary | ICD-10-CM | POA: Diagnosis not present

## 2024-05-13 DIAGNOSIS — I482 Chronic atrial fibrillation, unspecified: Secondary | ICD-10-CM | POA: Diagnosis not present

## 2024-05-13 DIAGNOSIS — E43 Unspecified severe protein-calorie malnutrition: Secondary | ICD-10-CM | POA: Insufficient documentation

## 2024-05-13 LAB — GASTROINTESTINAL PANEL BY PCR, STOOL (REPLACES STOOL CULTURE)

## 2024-05-13 LAB — BASIC METABOLIC PANEL WITH GFR
Anion gap: 9 (ref 5–15)
BUN: 10 mg/dL (ref 8–23)
CO2: 24 mmol/L (ref 22–32)
Calcium: 7.6 mg/dL — ABNORMAL LOW (ref 8.9–10.3)
Chloride: 106 mmol/L (ref 98–111)
Creatinine, Ser: 0.81 mg/dL (ref 0.44–1.00)
GFR, Estimated: >60 mL/min (ref >60)
Glucose, Bld: 98 mg/dL (ref 70–99)
Potassium: 3 mmol/L — ABNORMAL LOW (ref 3.5–5.1)
Sodium: 139 mmol/L (ref 135–145)

## 2024-05-13 LAB — CBC
HCT: 29.6 % — ABNORMAL LOW (ref 36.0–46.0)
Hemoglobin: 10.2 g/dL — ABNORMAL LOW (ref 12.0–15.0)
MCH: 34.6 pg — ABNORMAL HIGH (ref 26.0–34.0)
MCHC: 34.5 g/dL (ref 30.0–36.0)
MCV: 100.3 fL — ABNORMAL HIGH (ref 80.0–100.0)
Platelets: 127 K/uL — ABNORMAL LOW (ref 150–400)
RBC: 2.95 MIL/uL — ABNORMAL LOW (ref 3.87–5.11)
RDW: 13.4 % (ref 11.5–15.5)
WBC: 4.2 K/uL (ref 4.0–10.5)
nRBC: 0 % (ref 0.0–0.2)

## 2024-05-13 LAB — C DIFFICILE QUICK SCREEN W PCR REFLEX
C Diff antigen: NEGATIVE
C Diff interpretation: NOT DETECTED
C Diff toxin: NEGATIVE

## 2024-05-13 LAB — MAGNESIUM: Magnesium: 1.3 mg/dL — ABNORMAL LOW (ref 1.7–2.4)

## 2024-05-13 MED ORDER — OYSTER SHELL CALCIUM/D3 500-5 MG-MCG PO TABS
1.0000 | ORAL_TABLET | Freq: Two times a day (BID) | ORAL | Status: DC
  Administered 2024-05-13 – 2024-05-15 (×5): 1 via ORAL
  Filled 2024-05-13 (×5): qty 1

## 2024-05-13 MED ORDER — POTASSIUM CHLORIDE 20 MEQ PO PACK
40.0000 meq | PACK | Freq: Two times a day (BID) | ORAL | Status: AC
  Administered 2024-05-13 (×2): 40 meq via ORAL
  Filled 2024-05-13 (×2): qty 2

## 2024-05-13 MED ORDER — MAGNESIUM SULFATE 4 GM/100ML IV SOLN
4.0000 g | Freq: Once | INTRAVENOUS | Status: AC
  Administered 2024-05-13: 4 g via INTRAVENOUS
  Filled 2024-05-13: qty 100

## 2024-05-13 MED ORDER — AZITHROMYCIN 500 MG PO TABS
1000.0000 mg | ORAL_TABLET | Freq: Once | ORAL | Status: AC
  Administered 2024-05-13: 1000 mg via ORAL
  Filled 2024-05-13: qty 2

## 2024-05-13 NOTE — Progress Notes (Signed)
 PROGRESS NOTE Yvette [name]  FMW:[id number] DOB: Nov 03, 1923 DOA: 05/10/2024 PCP: Salina Clarity, NP  Brief Narrative/Hospital Course: Yvette [name] is a 89 y.o. female with PMH of essential hypertension, osteoarthritis, and chronic atrial fibrillation not currently on Eliquis  per medical advice, who presented to the ED with the onset of diarrhea over the last couple days with associated generalized weakness and fever and chills w/o nausea or vomiting or abdominal pain. In ED: Low-grade temp 100.1 and RR 21>30s. heart rate of 86 and later 106 and pulse oximetry of 98% on room air. Labs revealed hypokalemia 3.3 and CO2 of 20 with calcium  8.4 and creatinine 1.12.  CBC showed mild anemia with hemoglobin 11.1 hematocrit 33.2 close to baseline and thrombocytopenia of 132.  Respiratory panel came back negative.  UA was unremarkable. EKG >atrial fibrillation with controlled ventricular response 76 with incomplete right bundle branch block.  It showed Q waves septally. CT Abdominal and pelvic W/: New cardiomegaly with small pericardial effusion.New small right pleural effusion. New heterogeneous appearance of the liver with nodular contour worrisome for cirrhosis. New hypodense lesions in the liver are indeterminate. Recommend further evaluation with MRI. Diffuse gallbladder wall edema. Findings may be related to hepatic dysfunction, but cholecystitis not excluded. Mild diffuse colonic wall thickening with air-fluid levels compatible with colitis. Small amount of free fluid in the abdomen and pelvis.Aortic atherosclerosis.  The patient was given 1 L bolus of IV lactated Ringer  AND admitted for further workup on ceftriaxone  and Flagyl .  9/17: Hemodynamically stable, improving diarrhea, GI pathogen panel with enteropathogenic E. Coli.  Discontinuing ceftriaxone  and Flagyl , given a dose of 1 g of Zithromax .  PT recommending SNF-pending insurance authorization.  Subjective: Patient was sitting comfortably in bed when  seen today.  Last BM was during the night and it was loose, no BM since morning.  No abdominal pain.  Assessment and plan:   Diarrhea times few days with weakness and fever Acute colitis per CT finding Sepsis POA due to colitis: No abdominal pain but presenting with diarrhea CT findings noted as above.Patient w/  tachycardic tachypneic meeting sepsis criteria on admission.  Overall clinically improving, tolerating advancement in diet.  C. difficile PCR negative, GI pathogen panel with enteropathogenic E. coli -Discontinue ceftriaxone  and Flagyl  - 1 dose of 1 g of Zithromax  - Continue supportive care  Loss of appetite for few months Weight loss about 15 lb in past year Hypoglycemia: RD consulted.Palliative medicine consulted.Confirmed DNR status with POA Patient at risk for decompensation high risk for readmission-daughter is agreeable for palliative care and understands that if she does not improve but she may be candidate for hospice Encourage hydration-  Abnormal CT finding Worrisome for cirrhosis and Liver lesion in CT: [name] nonemergent MRI likely before discharge if family wants to proceed - will benefit with outpatient GI follow-up. Family-daughter does not want to proceed with further or given her age.   Small pericardial effusion and small right pleural effusion: Asymptomatic patient will need close follow-up with chest x-ray and echocardiogram as outpatient.  No cardiac or respiratory complaints currently.  Daughter not interested to pursue further workup  Hypertensive urgency: Blood pressure now well-controlled continue oral meds including as needed. Cont home clonidine  hydralazine  and losartan    Mild AKI Resolved .    Hypokalemia. Hypomagnesemia. Likely secondary to GI losses, potassium of 3 and magnesium  of 1.3 today. - Replace magnesium  and potassium -Continue to monitor   Atrial fibrillation, chronic: Rate controlled.Her Eliquis  was discontinued by her provider  likely secondary  to fall risk as she was seen for a fall on 9/5. Will continue to monitor her rhythm  Malnutrition Severe: Body mass index is 19.79 kg/m.: Dietitian consult.  Macrocytic anemia: Anemia panel stable.  Mobility: PT OT consulted  DVT prophylaxis: enoxaparin  (LOVENOX ) injection 30 mg Start: 05/11/24 0800 Code Status:   Code Status: Limited: Do not attempt resuscitation (DNR) -DNR-LIMITED -Do Not Intubate/DNI  she is DNR as per daughter. Family Communication: Discussed with daughter at bedside  Patient status is: Remains hospitalized because of severity of illness. Level of care: Telemetry Medical   Dispo: The patient is from: ILF            Anticipated disposition: SNF  Objective: Vitals last 24 hrs: Vitals:   05/11/24 2016 05/12/24 0020 05/12/24 0511 05/12/24 0800  BP: (!) 174/98 (!) 165/89 (!) 148/92 (!) 150/70  Pulse: 79 87 68 93  Resp: 16 16 19 18   Temp: 98.1 F (36.7 C)  97.8 F (36.6 C) 98 F (36.7 C)  TempSrc: Oral     SpO2: 97% 96% 95% 97%  Weight:      Height:        Physical Examination: General.  Frail and malnourished elderly lady, in no acute distress. Pulmonary.  Lungs clear bilaterally, normal respiratory effort. CV.  Regular rate and rhythm, no JVD, rub or murmur. Abdomen.  Soft, nontender, nondistended, BS positive. CNS.  Alert and oriented .  No focal neurologic deficit. Extremities.  No edema, no cyanosis, pulses intact and symmetrical. Psychiatry.  Judgment and insight appears normal.    Medications reviewed:  Scheduled Meds:  cloNIDine   0.1 mg Oral BID   enoxaparin  (LOVENOX ) injection  30 mg Subcutaneous Q24H   hydrALAZINE   10 mg Oral BID   losartan   100 mg Oral Daily   mirtazapine   30 mg Oral QHS   Continuous Infusions:  cefTRIAXone  (ROCEPHIN )  IV Stopped (05/12/24 0040)   dextrose  5 % and 0.9 % NaCl with KCl 20 mEq/L 40 mL/hr at 05/12/24 1029   metronidazole  Stopped (05/12/24 0243)   Diet: Diet Order             DIET  SOFT Room service appropriate? Yes; Fluid consistency: Thin  Diet effective now                    Data Reviewed: I have personally reviewed following labs and imaging studies ( see epic result tab) CBC: Recent Labs  Lab 05/10/24 1556 05/11/24 0437 05/12/24 0540 05/13/24 0424  WBC 5.1 6.8 5.3 4.2  HGB 11.1* 10.8* 11.0* 10.2*  HCT 33.2* 32.9* 32.2* 29.6*  MCV 102.8* 102.2* 100.0 100.3*  PLT 132* 121* 123* 127*   CMP: Recent Labs  Lab 05/10/24 1556 05/11/24 0437 05/12/24 0540 05/13/24 0424  NA 137 139 137 139  K 3.3* 3.8 3.3* 3.0*  CL 106 103 105 106  CO2 20* 19* 23 24  GLUCOSE 97 83 68* 98  BUN 16 12 11 10   CREATININE 1.12* 0.72 0.57 0.81  CALCIUM  8.4* 8.1* 7.8* 7.6*  MG 1.7  --   --  1.3*   GFR: Estimated Creatinine Clearance: 26.4 mL/min (by C-G formula based on SCr of 0.81 mg/dL). Recent Labs  Lab 05/10/24 1556  AST 23  ALT 11  ALKPHOS 67  BILITOT 1.1  PROT 7.4  ALBUMIN 3.8   No results for input(s): LIPASE, AMYLASE in the last 168 hours. No results for input(s): AMMONIA in the last 168 hours. Coagulation Profile:  Recent Labs  Lab 05/11/24 0437  INR 1.4*   Unresulted Labs (From admission, onward)     Start     Ordered   05/12/24 0500  Basic metabolic panel with GFR  Daily,   R      05/11/24 0844   05/12/24 0500  CBC  Daily,   R      05/11/24 0844           Antimicrobials/Microbiology: Anti-infectives (From admission, onward)    Start     Dose/Rate Route Frequency Ordered Stop   05/13/24 1115  azithromycin  (ZITHROMAX ) tablet 1,000 mg        1,000 mg Oral  Once 05/13/24 1019 05/13/24 1250   05/11/24 0015  cefTRIAXone  (ROCEPHIN ) 2 g in sodium chloride  0.9 % 100 mL IVPB  Status:  Discontinued        2 g 200 mL/hr over 30 Minutes Intravenous Every 24 hours 05/11/24 0006 05/13/24 1019   05/11/24 0015  metroNIDAZOLE  (FLAGYL ) IVPB 500 mg  Status:  Discontinued        500 mg 100 mL/hr over 60 Minutes Intravenous Every 12 hours 05/11/24  0006 05/13/24 1019         Component Value Date/Time   SDES  11/23/2019 1615    URINE, RANDOM Performed at [facility], [address] [place] [place], [place] [address]    [facility]  11/23/2019 1615    NONE Performed at [facility], [address] [place], [place] [address]    CULT >=100,000 COLONIES/mL ESCHERICHIA COLI (A) 11/23/2019 1615   REPTSTATUS 11/25/2019 FINAL 11/23/2019 1615    Procedures:  This record has been created using Conservation officer, historic buildings. Errors have been sought and corrected,but may not always be located. Such creation errors do not reflect on the standard of care.   Amaryllis Dare, MD [facility] 05/13/2024, 3:27 PM

## 2024-05-13 NOTE — Care Management Important Message (Signed)
 Important Message  Patient Details  Name: Yvette [name] MRN: [id number] Date of Birth: 07/18/1924   Important Message Given:  Yes - Medicare IM     Savvy Peeters W, CMA 05/13/2024, 11:45 AM

## 2024-05-13 NOTE — TOC Progression Note (Signed)
 Transition of Care [facility]) - Progression Note    Patient Details  Name: Yvette [name] MRN: [id number] Date of Birth: December 06, 1923  [facility] CM/SW Contact  Alvaro Louder, [place] Phone Number: 05/13/2024, 4:14 PM  Clinical Narrative: Family selected [facility], LCSWA started insurance auth for patient to admit to [facility]. Insurance auth pending. Awaiting response. Pending [id number]   TOC to follow for discharge                      Expected Discharge Plan and Services                                               Social Drivers of Health (SDOH) Interventions SDOH Screenings   Food Insecurity: Patient Unable To Answer (05/11/2024)  Housing: Patient Unable To Answer (05/11/2024)  Transportation Needs: Patient Unable To Answer (05/11/2024)  Utilities: Patient Unable To Answer (05/11/2024)  Social Connections: Patient Unable To Answer (05/11/2024)  Tobacco Use: Low Risk  (05/12/2024)    Readmission Risk Interventions     No data to display

## 2024-05-13 NOTE — Progress Notes (Signed)
 Mobility Specialist Progress Note:    05/13/24 1107  Mobility  Activity Ambulated with assistance  Level of Assistance Contact guard assist, steadying assist  Assistive Device Front wheel walker  Distance Ambulated (ft) 200 ft  Range of Motion/Exercises Active;All extremities  Activity Response Tolerated well  Mobility visit 1 Mobility  Mobility Specialist Start Time (ACUTE ONLY) 1052  Mobility Specialist Stop Time (ACUTE ONLY) 1107  Mobility Specialist Time Calculation (min) (ACUTE ONLY) 15 min   Pt received in chair, agreeable to mobility. Required CGA to stand and ambulate with RW. Tolerated well, asx throughout. Returned to chair, alarm on and belongings in reach. All needs met.  Sherrilee Ditty Mobility Specialist Please contact via Special educational needs teacher or  Rehab office at [phone]

## 2024-05-13 NOTE — Progress Notes (Signed)
 Physical Therapy Treatment Patient Details Name: Yvette [name] MRN: [id number] DOB: 1923-10-21 Today's Date: 05/13/2024   History of Present Illness 89 y.o. female with medical history significant for essential hypertension, osteoarthritis, and chronic atrial fibrillation not currently on Eliquis  per medical advice, who presented to the emergency room with the onset of diarrhea over the last couple days with associated generalized weakness and tactile fever and chills.    PT Comments  Pt is making good progress towards goals and reports just finishing ambulation with mobility specialist. Pt slightly fatigued but willing to ambulate with therapist. [facility] as per baseline level. Pt unsteady and needs assist for balance with occasional LOB noted. Per daughter, recent fall with ED visit approx 3 weeks ago. Currently not at baseline level and needs more support for home transition. Currently recommend <3 hr of skilled PT post discharge. Will continue to progress. Recommend continued use of RW with staff and mobility at this time.    If plan is discharge home, recommend the following: A little help with walking and/or transfers;A little help with bathing/dressing/bathroom;Assistance with cooking/housework;Help with stairs or ramp for entrance   Can travel by private vehicle     Yes  Equipment Recommendations  BSC/3in1    Recommendations for Other Services       Precautions / Restrictions Precautions Precautions: Fall Recall of Precautions/Restrictions: Intact Restrictions Weight Bearing Restrictions Per Provider Order: No     Mobility  Bed Mobility               General bed mobility comments: not performed, received in recliner pre/post session    Transfers Overall transfer level: Needs assistance Equipment used: Straight cane Transfers: Sit to/from Stand Sit to Stand: Contact guard assist           General transfer comment: safe technique with upright posture     Ambulation/Gait Ambulation/Gait assistance: Min assist Gait Distance (Feet): 100 Feet Assistive device: Straight cane Gait Pattern/deviations: Decreased step length - right, Decreased step length - left, Decreased stride length       General Gait Details: ambulated in hallway with SPC. Unsteady with occasional LOB noted. Narrow BOS noted. Fatigue noted with exertion   Stairs             Wheelchair Mobility     Tilt Bed    Modified Rankin (Stroke Patients Only)       Balance Overall balance assessment: Needs assistance Sitting-balance support: Feet supported Sitting balance-Leahy Scale: Good     Standing balance support: Bilateral upper extremity supported, During functional activity, Reliant on assistive device for balance, No upper extremity supported Standing balance-Leahy Scale: Fair                              Hotel manager: Impaired Factors Affecting Communication: Hearing impaired  Cognition Arousal: Alert Behavior During Therapy: WFL for tasks assessed/performed   PT - Cognitive impairments: Difficult to assess Difficult to assess due to: Hard of hearing/deaf                     PT - Cognition Comments: pleasant and agreeable to session Following commands: Impaired, Intact      Cueing Cueing Techniques: Verbal cues, Visual cues, Tactile cues  Exercises      General Comments        Pertinent Vitals/Pain Pain Assessment Pain Assessment: No/denies pain    Home Living  Prior Function            PT Goals (current goals can now be found in the care plan section) Acute Rehab PT Goals Patient Stated Goal: none stated PT Goal Formulation: With patient Time For Goal Achievement: 05/25/24 Potential to Achieve Goals: Good Progress towards PT goals: Progressing toward goals    Frequency    Min 2X/week      PT Plan      Co-evaluation               AM-PAC PT 6 Clicks Mobility   Outcome Measure  Help needed turning from your back to your side while in a flat bed without using bedrails?: None Help needed moving from lying on your back to sitting on the side of a flat bed without using bedrails?: A Little Help needed moving to and from a bed to a chair (including a wheelchair)?: A Little Help needed standing up from a chair using your arms (e.g., wheelchair or bedside chair)?: A Little Help needed to walk in hospital room?: A Little Help needed climbing 3-5 steps with a railing? : A Little 6 Click Score: 19    End of Session Equipment Utilized During Treatment: Gait belt Activity Tolerance: Patient tolerated treatment well Patient left: in chair;with chair alarm set;with call bell/phone within reach Nurse Communication: Mobility status PT Visit Diagnosis: Other abnormalities of gait and mobility (R26.89);Muscle weakness (generalized) (M62.81)     Time: 8889-8877 PT Time Calculation (min) (ACUTE ONLY): 12 min  Charges:    $Gait Training: 8-22 mins PT General Charges $$ ACUTE PT VISIT: 1 Visit                     Corean Dade, PT, DPT, GCS [phone]    Sunshine Mackowski 05/13/2024, 2:22 PM

## 2024-05-13 NOTE — Progress Notes (Signed)
 Palliative: Chart review completed.  Overall Yvette [name] has improved.    Face-to-face discussion with bedside nursing staff related to patient condition, needs.  Plan: Continue to treat the treatable but no CPR or intubation.  Short-term rehab with ultimate goal of returning to [facility] independent living where she has been for approximately 3 years.  Outpatient palliative/hospice services with [facility]. DNR/goldenrod form completed and placed on chart.  No charge Lorenza Birkenhead, NP Palliative medicine team Team phone [phone]

## 2024-05-13 NOTE — Progress Notes (Signed)
 Mobility Specialist Progress Note:    05/13/24 0850  Mobility  Activity Stood at bedside;Pivoted/transferred from bed to chair  Level of Assistance Minimal assist, patient does 75% or more  Assistive Device  (HHA)  Distance Ambulated (ft) 3 ft  Range of Motion/Exercises Active;All extremities  Activity Response Tolerated well  Mobility visit 1 Mobility  Mobility Specialist Start Time (ACUTE ONLY) 0830  Mobility Specialist Stop Time (ACUTE ONLY) 0840  Mobility Specialist Time Calculation (min) (ACUTE ONLY) 10 min   Tolerated well, pt asked for the author to return later for ambulation. Alarm on, belongings in reach. All needs met.  Sherrilee Ditty Mobility Specialist Please contact via Special educational needs teacher or  Rehab office at [phone]

## 2024-05-13 NOTE — Plan of Care (Signed)

## 2024-05-14 DIAGNOSIS — R531 Weakness: Secondary | ICD-10-CM | POA: Diagnosis not present

## 2024-05-14 DIAGNOSIS — K529 Noninfective gastroenteritis and colitis, unspecified: Secondary | ICD-10-CM | POA: Diagnosis not present

## 2024-05-14 DIAGNOSIS — I482 Chronic atrial fibrillation, unspecified: Secondary | ICD-10-CM | POA: Diagnosis not present

## 2024-05-14 DIAGNOSIS — I160 Hypertensive urgency: Secondary | ICD-10-CM | POA: Diagnosis not present

## 2024-05-14 LAB — CBC
HCT: 29.5 % — ABNORMAL LOW (ref 36.0–46.0)
Hemoglobin: 9.8 g/dL — ABNORMAL LOW (ref 12.0–15.0)
MCH: 33.7 pg (ref 26.0–34.0)
MCHC: 33.2 g/dL (ref 30.0–36.0)
MCV: 101.4 fL — ABNORMAL HIGH (ref 80.0–100.0)
Platelets: 148 K/uL — ABNORMAL LOW (ref 150–400)
RBC: 2.91 MIL/uL — ABNORMAL LOW (ref 3.87–5.11)
RDW: 13.5 % (ref 11.5–15.5)
WBC: 3.8 K/uL — ABNORMAL LOW (ref 4.0–10.5)
nRBC: 0 % (ref 0.0–0.2)

## 2024-05-14 LAB — BASIC METABOLIC PANEL WITH GFR
Anion gap: 5 (ref 5–15)
BUN: 8 mg/dL (ref 8–23)
CO2: 22 mmol/L (ref 22–32)
Calcium: 8.1 mg/dL — ABNORMAL LOW (ref 8.9–10.3)
Chloride: 110 mmol/L (ref 98–111)
Creatinine, Ser: 0.54 mg/dL (ref 0.44–1.00)
GFR, Estimated: >60 mL/min (ref >60)
Glucose, Bld: 90 mg/dL (ref 70–99)
Potassium: 4.1 mmol/L (ref 3.5–5.1)
Sodium: 137 mmol/L (ref 135–145)

## 2024-05-14 LAB — MAGNESIUM: Magnesium: 2.5 mg/dL — ABNORMAL HIGH (ref 1.7–2.4)

## 2024-05-14 NOTE — Progress Notes (Signed)
 PROGRESS NOTE Yvette [name]  FMW:[id number] DOB: [birth year] DOA: 05/10/2024 PCP: Salina Clarity, NP  Brief Narrative/Hospital Course: Yvette [name] is a 89 y.o. female with PMH of essential hypertension, osteoarthritis, and chronic atrial fibrillation not currently on Eliquis  per medical advice, who presented to the ED with the onset of diarrhea over the last couple days with associated generalized weakness and fever and chills w/o nausea or vomiting or abdominal pain. In ED: Low-grade temp 100.1 and RR 21>30s. heart rate of 86 and later 106 and pulse oximetry of 98% on room air. Labs revealed hypokalemia 3.3 and CO2 of 20 with calcium  8.4 and creatinine 1.12.  CBC showed mild anemia with hemoglobin 11.1 hematocrit 33.2 close to baseline and thrombocytopenia of 132.  Respiratory panel came back negative.  UA was unremarkable. EKG >atrial fibrillation with controlled ventricular response 76 with incomplete right bundle branch block.  It showed Q waves septally. CT Abdominal and pelvic W/: New cardiomegaly with small pericardial effusion.New small right pleural effusion. New heterogeneous appearance of the liver with nodular contour worrisome for cirrhosis. New hypodense lesions in the liver are indeterminate. Recommend further evaluation with MRI. Diffuse gallbladder wall edema. Findings may be related to hepatic dysfunction, but cholecystitis not excluded. Mild diffuse colonic wall thickening with air-fluid levels compatible with colitis. Small amount of free fluid in the abdomen and pelvis.Aortic atherosclerosis.  The patient was given 1 L bolus of IV lactated Ringer  AND admitted for further workup on ceftriaxone  and Flagyl .  9/17: Hemodynamically stable, improving diarrhea, GI pathogen panel with enteropathogenic E. Coli.  Discontinuing ceftriaxone  and Flagyl , given a dose of 1 g of Zithromax .  PT recommending SNF-pending insurance authorization. 9/18: Hemodynamically stable, obtained insurance  authorization to go to rehab tomorrow  Subjective: Patient was sitting in chair comfortably when seen today.  No more diarrhea or abdominal pain.  Eating and drinking okay.  Assessment and plan:   Diarrhea times few days with weakness and fever Acute colitis per CT finding Sepsis POA due to colitis: No abdominal pain but presenting with diarrhea CT findings noted as above.Patient w/  tachycardic tachypneic meeting sepsis criteria on admission.  Overall clinically improving, tolerating advancement in diet.  C. difficile PCR negative, GI pathogen panel with enteropathogenic E. coli -Discontinue ceftriaxone  and Flagyl  - 1 dose of 1 g of Zithromax  was given on 9/17 - Continue supportive care  Loss of appetite for few months Weight loss about 15 lb in past year Hypoglycemia: RD consulted.Palliative medicine consulted.Confirmed DNR status with POA Patient at risk for decompensation high risk for readmission-daughter is agreeable for palliative care and understands that if she does not improve but she may be candidate for hospice Encourage hydration-  Abnormal CT finding Worrisome for cirrhosis and Liver lesion in CT: needs nonemergent MRI likely before discharge if family wants to proceed - will benefit with outpatient GI follow-up. Family-daughter does not want to proceed with further or given her age.   Small pericardial effusion and small right pleural effusion: Asymptomatic patient will need close follow-up with chest x-ray and echocardiogram as outpatient.  No cardiac or respiratory complaints currently.  Daughter not interested to pursue further workup  Hypertensive urgency: Blood pressure now well-controlled continue oral meds including as needed. Cont home clonidine  hydralazine  and losartan    Mild AKI Resolved .    Hypokalemia. Hypomagnesemia. Likely secondary to GI losses, potassium of 3 and magnesium  of 1.3 today. - Replace magnesium  and potassium -Continue to monitor    Atrial fibrillation, chronic: Rate controlled.Her  Eliquis  was discontinued by her provider likely secondary to fall risk as she was seen for a fall on 9/5. Will continue to monitor her rhythm  Malnutrition Severe: Body mass index is 19.79 kg/m.: Dietitian consult.  Macrocytic anemia: Anemia panel stable.  Mobility: PT OT consulted  DVT prophylaxis: enoxaparin  (LOVENOX ) injection 30 mg Start: 05/11/24 0800 Code Status:   Code Status: Limited: Do not attempt resuscitation (DNR) -DNR-LIMITED -Do Not Intubate/DNI  she is DNR as per daughter. Family Communication: Discussed with daughter at bedside  Patient status is: Remains hospitalized because of severity of illness. Level of care: Telemetry Medical   Dispo: The patient is from: ILF            Anticipated disposition: SNF  Objective: Vitals last 24 hrs: Vitals:   05/11/24 2016 05/12/24 0020 05/12/24 0511 05/12/24 0800  BP: (!) 174/98 (!) 165/89 (!) 148/92 (!) 150/70  Pulse: 79 87 68 93  Resp: 16 16 19 18   Temp: 98.1 F (36.7 C)  97.8 F (36.6 C) 98 F (36.7 C)  TempSrc: Oral     SpO2: 97% 96% 95% 97%  Weight:      Height:        Physical Examination: General.  Frail and malnourished elderly lady, in no acute distress. Pulmonary.  Lungs clear bilaterally, normal respiratory effort. CV.  Regular rate and rhythm, no JVD, rub or murmur. Abdomen.  Soft, nontender, nondistended, BS positive. CNS.  Alert and oriented .  No focal neurologic deficit. Extremities.  No edema, no cyanosis, pulses intact and symmetrical. Psychiatry.  Judgment and insight appears normal.    Medications reviewed:  Scheduled Meds:  cloNIDine   0.1 mg Oral BID   enoxaparin  (LOVENOX ) injection  30 mg Subcutaneous Q24H   hydrALAZINE   10 mg Oral BID   losartan   100 mg Oral Daily   mirtazapine   30 mg Oral QHS   Continuous Infusions:  cefTRIAXone  (ROCEPHIN )  IV Stopped (05/12/24 0040)   dextrose  5 % and 0.9 % NaCl with KCl 20 mEq/L 40 mL/hr at  05/12/24 1029   metronidazole  Stopped (05/12/24 0243)   Diet: Diet Order             DIET SOFT Room service appropriate? Yes; Fluid consistency: Thin  Diet effective now                    Data Reviewed: I have personally reviewed following labs and imaging studies ( see epic result tab) CBC: Recent Labs  Lab 05/10/24 1556 05/11/24 0437 05/12/24 0540 05/13/24 0424 05/14/24 0434  WBC 5.1 6.8 5.3 4.2 3.8*  HGB 11.1* 10.8* 11.0* 10.2* 9.8*  HCT 33.2* 32.9* 32.2* 29.6* 29.5*  MCV 102.8* 102.2* 100.0 100.3* 101.4*  PLT 132* 121* 123* 127* 148*   CMP: Recent Labs  Lab 05/10/24 1556 05/11/24 0437 05/12/24 0540 05/13/24 0424 05/14/24 0434  NA 137 139 137 139 137  K 3.3* 3.8 3.3* 3.0* 4.1  CL 106 103 105 106 110  CO2 20* 19* 23 24 22   GLUCOSE 97 83 68* 98 90  BUN 16 12 11 10 8   CREATININE 1.12* 0.72 0.57 0.81 0.54  CALCIUM  8.4* 8.1* 7.8* 7.6* 8.1*  MG 1.7  --   --  1.3* 2.5*   GFR: Estimated Creatinine Clearance: 26.8 mL/min (by C-G formula based on SCr of 0.54 mg/dL). Recent Labs  Lab 05/10/24 1556  AST 23  ALT 11  ALKPHOS 67  BILITOT 1.1  PROT 7.4  ALBUMIN  3.8   No results for input(s): LIPASE, AMYLASE in the last 168 hours. No results for input(s): AMMONIA in the last 168 hours. Coagulation Profile:  Recent Labs  Lab 05/11/24 0437  INR 1.4*   Unresulted Labs (From admission, onward)     Start     Ordered   05/12/24 0500  Basic metabolic panel with GFR  Daily,   R      05/11/24 0844   05/12/24 0500  CBC  Daily,   R      05/11/24 0844           Antimicrobials/Microbiology: Anti-infectives (From admission, onward)    Start     Dose/Rate Route Frequency Ordered Stop   05/13/24 1115  azithromycin  (ZITHROMAX ) tablet 1,000 mg        1,000 mg Oral  Once 05/13/24 1019 05/13/24 1250   05/11/24 0015  cefTRIAXone  (ROCEPHIN ) 2 g in sodium chloride  0.9 % 100 mL IVPB  Status:  Discontinued        2 g 200 mL/hr over 30 Minutes Intravenous Every 24  hours 05/11/24 0006 05/13/24 1019   05/11/24 0015  metroNIDAZOLE  (FLAGYL ) IVPB 500 mg  Status:  Discontinued        500 mg 100 mL/hr over 60 Minutes Intravenous Every 12 hours 05/11/24 0006 05/13/24 1019         Component Value Date/Time   SDES  11/23/2019 1615    URINE, RANDOM Performed at [facility], [address] [place] [place], [place] [address]    [facility]  11/23/2019 1615    NONE Performed at [facility], [address] [place], [place] [address]    CULT >=100,000 COLONIES/mL ESCHERICHIA COLI (A) 11/23/2019 1615   REPTSTATUS 11/25/2019 FINAL 11/23/2019 1615    Procedures:  This record has been created using Conservation officer, historic buildings. Errors have been sought and corrected,but may not always be located. Such creation errors do not reflect on the standard of care.   Amaryllis Dare, MD [facility] 05/14/2024, 4:46 PM

## 2024-05-14 NOTE — Plan of Care (Signed)

## 2024-05-14 NOTE — Progress Notes (Signed)
 Occupational Therapy Treatment Patient Details Name: Yvette [name] MRN: [id number] DOB: 02-Feb-1924 Today's Date: 05/14/2024   History of present illness 89 y.o. female with medical history significant for essential hypertension, osteoarthritis, and chronic atrial fibrillation not currently on Eliquis  per medical advice, who presented to the emergency room with the onset of diarrhea over the last couple days with associated generalized weakness and tactile fever and chills.   OT comments  Yvette [name] was seen for OT treatment on this date. Upon arrival to room pt seated in chair, agreeable to tx. Pt requires CGA + SPC for ADL t/f and standing grooming tasks, mild LOB noted, corrects with furniture. Unable to reach outside BOS to open doors. Pt making good progress toward goals, will continue to follow POC. Discharge recommendation remains appropriate.        If plan is discharge home, recommend the following:  A little help with walking and/or transfers;A little help with bathing/dressing/bathroom;Assist for transportation;Help with stairs or ramp for entrance   Equipment Recommendations  None recommended by OT    Recommendations for Other Services      Precautions / Restrictions Precautions Precautions: Fall Recall of Precautions/Restrictions: Intact Restrictions Weight Bearing Restrictions Per Provider Order: No       Mobility Bed Mobility               General bed mobility comments: not tested    Transfers Overall transfer level: Needs assistance Equipment used: Straight cane Transfers: Sit to/from Stand Sit to Stand: Contact guard assist                 Balance Overall balance assessment: Needs assistance Sitting-balance support: Feet supported Sitting balance-Leahy Scale: Good     Standing balance support: No upper extremity supported, During functional activity Standing balance-Leahy Scale: Fair                             ADL either  performed or assessed with clinical judgement   ADL Overall ADL's : Needs assistance/impaired                                       General ADL Comments: CGA + SPC for ADL t/f and standing grooming tasks, mild LOB noted, corrects with furniture     Communication Communication Communication: Impaired Factors Affecting Communication: Hearing impaired   Cognition Arousal: Alert Behavior During Therapy: WFL for tasks assessed/performed Cognition: No apparent impairments                               Following commands: Intact                      Pertinent Vitals/ Pain       Pain Assessment Pain Assessment: No/denies pain   Frequency  Min 2X/week        Progress Toward Goals  OT Goals(current goals can now be found in the care plan section)  Progress towards OT goals: Progressing toward goals  Acute Rehab OT Goals OT Goal Formulation: With patient/family Time For Goal Achievement: 05/25/24 Potential to Achieve Goals: Fair ADL Goals Pt Will Perform Grooming: with modified independence Pt Will Perform Lower Body Dressing: with modified independence Pt Will Transfer to Toilet: with modified independence Pt Will Perform Toileting - Clothing Manipulation and  hygiene: with modified independence  Plan      Co-evaluation                 AM-PAC OT 6 Clicks Daily Activity     Outcome Measure   Help from another person eating meals?: None Help from another person taking care of personal grooming?: None Help from another person toileting, which includes using toliet, bedpan, or urinal?: A Little Help from another person bathing (including washing, rinsing, drying)?: A Little Help from another person to put on and taking off regular upper body clothing?: A Little Help from another person to put on and taking off regular lower body clothing?: A Little 6 Click Score: 20    End of Session    OT Visit Diagnosis: Unsteadiness on  feet (R26.81);Muscle weakness (generalized) (M62.81)   Activity Tolerance Patient tolerated treatment well   Patient Left in chair;with call bell/phone within reach;with chair alarm set   Nurse Communication          Time: 8659-8651 OT Time Calculation (min): 8 min  Charges: OT General Charges $OT Visit: 1 Visit OT Treatments $Self Care/Home Management : 8-22 mins  Elston Slot, M.S. OTR/L  05/14/24, 1:56 PM  ascom [phone]

## 2024-05-14 NOTE — TOC Progression Note (Signed)
 Transition of Care [facility]) - Progression Note    Patient Details  Name: Yvette [name] MRN: [id number] Date of Birth: 07/09/1924  [facility]) CM/SW Contact  Alvaro Louder, [place] Phone Number: 05/14/2024, 3:07 PM  Clinical Narrative:   Insurance auth approved for patient to admit to [facility]. approved 9/17- 9/19 Plan Auth PI#[id number]   TOC to follow for discharge                       Expected Discharge Plan and Services                                               Social Drivers of Health (SDOH) Interventions SDOH Screenings   Food Insecurity: Patient Unable To Answer (05/11/2024)  Housing: Patient Unable To Answer (05/11/2024)  Transportation Needs: Patient Unable To Answer (05/11/2024)  Utilities: Patient Unable To Answer (05/11/2024)  Social Connections: Patient Unable To Answer (05/11/2024)  Tobacco Use: Low Risk  (05/12/2024)    Readmission Risk Interventions     No data to display

## 2024-05-14 NOTE — Progress Notes (Signed)
 alliative: Chart review completed.  Overall Yvette [name] has improved.  Bedside nursing staff  shares that Yvette [name] has been moving very well with no complaints.  She shares that at this point, Yvette [name] has been qualified for short-term rehab and is awaiting insurance approval.   Face-to-face discussion with bedside nursing staff and transition of care team related to patient condition, needs.   Plan: Continue to treat the treatable but no CPR or intubation.  Short-term rehab with ultimate goal of returning to [facility] independent living where she has been for approximately 3 years.  Outpatient palliative/hospice services with [facility]. DNR/goldenrod form completed and placed on chart.   No charge Lorenza Birkenhead, NP Palliative medicine team Team phone [phone]

## 2024-05-15 DIAGNOSIS — I160 Hypertensive urgency: Secondary | ICD-10-CM | POA: Diagnosis not present

## 2024-05-15 DIAGNOSIS — I482 Chronic atrial fibrillation, unspecified: Secondary | ICD-10-CM | POA: Diagnosis not present

## 2024-05-15 DIAGNOSIS — K529 Noninfective gastroenteritis and colitis, unspecified: Secondary | ICD-10-CM | POA: Diagnosis not present

## 2024-05-15 DIAGNOSIS — R531 Weakness: Secondary | ICD-10-CM | POA: Diagnosis not present

## 2024-05-15 LAB — CBC
HCT: 32.5 % — ABNORMAL LOW (ref 36.0–46.0)
Hemoglobin: 10.6 g/dL — ABNORMAL LOW (ref 12.0–15.0)
MCH: 33.3 pg (ref 26.0–34.0)
MCHC: 32.6 g/dL (ref 30.0–36.0)
MCV: 102.2 fL — ABNORMAL HIGH (ref 80.0–100.0)
Platelets: 167 K/uL (ref 150–400)
RBC: 3.18 MIL/uL — ABNORMAL LOW (ref 3.87–5.11)
RDW: 13.8 % (ref 11.5–15.5)
WBC: 3.9 K/uL — ABNORMAL LOW (ref 4.0–10.5)
nRBC: 0 % (ref 0.0–0.2)

## 2024-05-15 LAB — BASIC METABOLIC PANEL WITH GFR
Anion gap: 8 (ref 5–15)
BUN: 8 mg/dL (ref 8–23)
CO2: 25 mmol/L (ref 22–32)
Calcium: 8.8 mg/dL — ABNORMAL LOW (ref 8.9–10.3)
Chloride: 107 mmol/L (ref 98–111)
Creatinine, Ser: 0.63 mg/dL (ref 0.44–1.00)
GFR, Estimated: >60 mL/min (ref >60)
Glucose, Bld: 83 mg/dL (ref 70–99)
Potassium: 4.2 mmol/L (ref 3.5–5.1)
Sodium: 140 mmol/L (ref 135–145)

## 2024-05-15 MED ORDER — ADULT MULTIVITAMIN W/MINERALS CH: 1.0000 | ORAL_TABLET | Freq: Every day | ORAL | Status: AC

## 2024-05-15 MED ORDER — ACETAMINOPHEN 325 MG PO TABS: 650.0000 mg | ORAL_TABLET | Freq: Four times a day (QID) | ORAL | Status: AC | PRN

## 2024-05-15 NOTE — Plan of Care (Signed)
  Problem: Education: Goal: Knowledge of General Education information will improve Description: Including pain rating scale, medication(s)/side effects and non-pharmacologic comfort measures Outcome: Adequate for Discharge   Problem: Health Behavior/Discharge Planning: Goal: Ability to manage health-related needs will improve Outcome: Adequate for Discharge   Problem: Clinical Measurements: Goal: Ability to maintain clinical measurements within normal limits will improve Outcome: Adequate for Discharge Goal: Diagnostic test results will improve Outcome: Adequate for Discharge Goal: Respiratory complications will improve Outcome: Adequate for Discharge Goal: Cardiovascular complication will be avoided Outcome: Adequate for Discharge   Problem: Activity: Goal: Risk for activity intolerance will decrease Outcome: Adequate for Discharge   Problem: Nutrition: Goal: Adequate nutrition will be maintained Outcome: Adequate for Discharge   Problem: Safety: Goal: Ability to remain Yvette [name] from injury will improve Outcome: Adequate for Discharge   Problem: Skin Integrity: Goal: Risk for impaired skin integrity will decrease Outcome: Adequate for Discharge

## 2024-05-15 NOTE — Plan of Care (Signed)

## 2024-05-15 NOTE — Progress Notes (Signed)
 Physical Therapy Treatment Patient Details Name: Yvette [name] MRN: [id number] DOB: [birth year] Today's Date: 05/15/2024   History of Present Illness 89 y.o. female with medical history significant for essential hypertension, osteoarthritis, and chronic atrial fibrillation not currently on Eliquis  per medical advice, who presented to the emergency room with the onset of diarrhea over the last couple days with associated generalized weakness and tactile fever and chills.    PT Comments  Increased steadiness with RW, No LOB noted,  300' at supervision level. Increased activity tolerance with 2 hand support from RW. Pt reports feeling more steady with RW and is agreeable to use during mobility.  Continued PT will assist pt towards greater dynamic standing balance, LE strengthening, and activity tolerance to increase safety and independence and decrease burden of care with functional mobility.     If plan is discharge home, recommend the following: A little help with walking and/or transfers;A little help with bathing/dressing/bathroom;Assistance with cooking/housework;Help with stairs or ramp for entrance   Can travel by private vehicle     Yes  Equipment Recommendations  BSC/3in1    Recommendations for Other Services       Precautions / Restrictions Precautions Precautions: Fall Recall of Precautions/Restrictions: Intact Restrictions Weight Bearing Restrictions Per Provider Order: No     Mobility  Bed Mobility               General bed mobility comments: not tested    Transfers Overall transfer level: Needs assistance Equipment used: Rolling walker (2 wheels) Transfers: Sit to/from Stand, Bed to chair/wheelchair/BSC Sit to Stand: Supervision   Step pivot transfers: Supervision       General transfer comment: cues for safe handplacement with RW in front.    Ambulation/Gait Ambulation/Gait assistance: Supervision Gait Distance (Feet): 300 Feet Assistive device:  Rolling walker (2 wheels) Gait Pattern/deviations: Step-through pattern, Decreased stride length Gait velocity: decreased     General Gait Details: Increased steadiness with RW, No LOB noted.  Increased activity tolerance with 2 hand support from RW.   Stairs             Wheelchair Mobility     Tilt Bed    Modified Rankin (Stroke Patients Only)       Balance Overall balance assessment: Needs assistance Sitting-balance support: Feet supported Sitting balance-Leahy Scale: Good     Standing balance support: During functional activity, No upper extremity supported Standing balance-Leahy Scale: Fair                              Hotel manager: Impaired Factors Affecting Communication: Hearing impaired  Cognition Arousal: Alert Behavior During Therapy: WFL for tasks assessed/performed   PT - Cognitive impairments: Difficult to assess Difficult to assess due to: Hard of hearing/deaf                     PT - Cognition Comments: pleasant and agreeable to session Following commands: Intact Following commands impaired: Follows one step commands with increased time    Cueing Cueing Techniques: Verbal cues, Visual cues, Tactile cues  Exercises      General Comments        Pertinent Vitals/Pain Pain Assessment Pain Assessment: No/denies pain    Home Living                          Prior Function  PT Goals (current goals can now be found in the care plan section) Acute Rehab PT Goals Patient Stated Goal: none stated PT Goal Formulation: With patient Time For Goal Achievement: 05/25/24 Potential to Achieve Goals: Good Progress towards PT goals: Progressing toward goals    Frequency    Min 2X/week      PT Plan      Co-evaluation              AM-PAC PT 6 Clicks Mobility   Outcome Measure  Help needed turning from your back to your side while in a flat bed without using  bedrails?: None Help needed moving from lying on your back to sitting on the side of a flat bed without using bedrails?: A Little Help needed moving to and from a bed to a chair (including a wheelchair)?: A Little Help needed standing up from a chair using your arms (e.g., wheelchair or bedside chair)?: A Little Help needed to walk in hospital room?: A Little Help needed climbing 3-5 steps with a railing? : A Little 6 Click Score: 19    End of Session   Activity Tolerance: Patient tolerated treatment well Patient left: in chair;with chair alarm set;with call bell/phone within reach;with family/visitor present Nurse Communication: Mobility status PT Visit Diagnosis: Other abnormalities of gait and mobility (R26.89);Muscle weakness (generalized) (M62.81)     Time: 0950-1007 PT Time Calculation (min) (ACUTE ONLY): 17 min  Charges:    $Therapeutic Activity: 8-22 mins PT General Charges $$ ACUTE PT VISIT: 1 Visit                     Harland Irving, PTA  05/15/24, 10:19 AM

## 2024-05-15 NOTE — Progress Notes (Signed)
 All requirements for discharge met. Rosaline DELENA Range, RN

## 2024-05-15 NOTE — Discharge Summary (Signed)
 Physician Discharge Summary   Patient: Yvette [name] MRN: [id number] DOB: 04-30-1924  Admit date:     05/10/2024  Discharge date: 05/15/24  Discharge Physician: Amaryllis Dare   PCP: Salina Clarity, NP   Recommendations at discharge:  Please obtain CBC and BMP and follow-up Follow-up with primary care provider.  Discharge Diagnoses: Principal Problem:   Acute colitis Active Problems:   Hypertensive urgency   Atrial fibrillation, chronic (HCC)   Hypokalemia   Protein-calorie malnutrition, severe   Generalized weakness   Hospital Course: Yvette [name] is a 89 y.o. female with PMH of essential hypertension, osteoarthritis, and chronic atrial fibrillation not currently on Eliquis  per medical advice, who presented to the ED with the onset of diarrhea over the last couple days with associated generalized weakness and fever and chills w/o nausea or vomiting or abdominal pain. In ED: Low-grade temp 100.1 and RR 21>30s. heart rate of 86 and later 106 and pulse oximetry of 98% on room air. Labs revealed hypokalemia 3.3 and CO2 of 20 with calcium  8.4 and creatinine 1.12.  CBC showed mild anemia with hemoglobin 11.1 hematocrit 33.2 close to baseline and thrombocytopenia of 132.  Respiratory panel came back negative.  UA was unremarkable. EKG >atrial fibrillation with controlled ventricular response 76 with incomplete right bundle branch block.  It showed Q waves septally. CT Abdominal and pelvic W/: New cardiomegaly with small pericardial effusion.New small right pleural effusion. New heterogeneous appearance of the liver with nodular contour worrisome for cirrhosis. New hypodense lesions in the liver are indeterminate. Recommend further evaluation with MRI. Diffuse gallbladder wall edema. Findings may be related to hepatic dysfunction, but cholecystitis not excluded. Mild diffuse colonic wall thickening with air-fluid levels compatible with colitis. Small amount of free fluid in the abdomen and  pelvis.Aortic atherosclerosis.  The patient was given 1 L bolus of IV lactated Ringer  AND admitted for further workup on ceftriaxone  and Flagyl .  9/17: Hemodynamically stable, improving diarrhea, GI pathogen panel with enteropathogenic E. Coli.  Discontinuing ceftriaxone  and Flagyl , given a dose of 1 g of Zithromax .  PT recommending SNF-pending insurance authorization. 9/18: Hemodynamically stable, obtained insurance authorization to go to rehab tomorrow 9/19: Vitals and labs stable.  Patient will continue with her home medications and need to have a close follow-up with her PCP for further assistance.  She is being discharged to SNF for rehab.  Assessment and plan:   Diarrhea times few days with weakness and fever Acute colitis per CT finding Sepsis POA due to colitis: No abdominal pain but presenting with diarrhea CT findings noted as above.Patient w/  tachycardic tachypneic meeting sepsis criteria on admission.  Overall clinically improving, tolerating advancement in diet.  C. difficile PCR negative, GI pathogen panel with enteropathogenic E. coli -Discontinue ceftriaxone  and Flagyl  - 1 dose of 1 g of Zithromax  was given on 9/17 - Continue supportive care  Loss of appetite for few months Weight loss about 15 lb in past year Hypoglycemia: RD consulted.Palliative medicine consulted.Confirmed DNR status with POA Patient at risk for decompensation high risk for readmission-daughter is agreeable for palliative care and understands that if she does not improve but she may be candidate for hospice Encourage hydration-  Abnormal CT finding Worrisome for cirrhosis and Liver lesion in CT: needs nonemergent MRI likely before discharge if family wants to proceed - will benefit with outpatient GI follow-up. Family-daughter does not want to proceed with further or given her age.   Small pericardial effusion and small right pleural effusion: Asymptomatic patient will  need close follow-up with  chest x-ray and echocardiogram as outpatient.  No cardiac or respiratory complaints currently.  Daughter not interested to pursue further workup  Hypertensive urgency: Blood pressure now well-controlled continue oral meds including as needed. Cont home clonidine  hydralazine  and losartan    Mild AKI Resolved .    Hypokalemia. Hypomagnesemia. Likely secondary to GI losses, potassium of 3 and magnesium  of 1.3 today. - Replace magnesium  and potassium -Continue to monitor   Atrial fibrillation, chronic: Rate controlled.Her Eliquis  was discontinued by her provider likely secondary to fall risk as she was seen for a fall on 9/5. Will continue to monitor her rhythm  Malnutrition Severe: Body mass index is 19.79 kg/m.: Dietitian consult.  Macrocytic anemia: Anemia panel stable.      Consultants: Palliative care Procedures performed: None Disposition: Skilled nursing facility Diet recommendation:  Regular diet DISCHARGE MEDICATION: Allergies as of 05/15/2024       Reactions   Metoprolol  Swelling   In legs In legs   Amlodipine Other (See Comments)   edema   Hydrocodone-acetaminophen  Nausea Only        Medication List     STOP taking these medications    apixaban  2.5 MG Tabs tablet Commonly known as: ELIQUIS        TAKE these medications    acetaminophen  325 MG tablet Commonly known as: TYLENOL  Take 2 tablets (650 mg total) by mouth every 6 (six) hours as needed for mild pain (pain score 1-3) or fever (or Fever >/= 101).   calcium  citrate-vitamin D  315-200 MG-UNIT tablet Commonly known as: CITRACAL+D Take 1 tablet by mouth daily.   cloNIDine  0.1 MG tablet Commonly known as: CATAPRES  Take 1 tablet (0.1 mg total) by mouth 2 (two) times daily.   hydrALAZINE  10 MG tablet Commonly known as: APRESOLINE  Take 1 tablet (10 mg total) by mouth 3 (three) times daily. What changed: when to take this   losartan  100 MG tablet Commonly known as: COZAAR  Take 100 mg by  mouth daily.   mirtazapine  30 MG tablet Commonly known as: REMERON  Take 30 mg by mouth at bedtime.   multivitamin with minerals Tabs tablet Take 1 tablet by mouth daily. Start taking on: May 16, 2024        Contact information for follow-up providers     Salina Clarity, NP. Schedule an appointment as soon as possible for a visit in 1 week(s).   Specialty: Nurse Practitioner Contact information: [address] [place] [address] [phone]              Contact information for after-discharge care     Destination     [facility] [place], [place]. [place]   Service: Skilled Nursing Contact information: [address] [place]  [address] [phone]                    Discharge Exam: Fredricka Weights   05/10/24 1553 05/10/24 1848  Weight: 49 kg 44.5 kg   General.  Frail and malnourished elderly lady, in no acute distress. Pulmonary.  Lungs clear bilaterally, normal respiratory effort. CV.  Regular rate and rhythm, no JVD, rub or murmur. Abdomen.  Soft, nontender, nondistended, BS positive. CNS.  Alert and oriented .  No focal neurologic deficit. Extremities.  No edema, pulses intact and symmetrical.  Condition at discharge: stable  The results of significant diagnostics from this hospitalization (including imaging, microbiology, ancillary and laboratory) are listed below for reference.   Imaging Studies: US  ABDOMEN LIMITED RUQ (  LIVER/GB) Result Date: 05/10/2024 CLINICAL DATA:  Right upper quadrant pain EXAM: ULTRASOUND ABDOMEN LIMITED RIGHT UPPER QUADRANT COMPARISON:  CT abdomen and pelvis 05/10/2024 FINDINGS: Gallbladder: Gallbladder wall is upper limits of normal in thickness at 3 mm. There is no pericholecystic fluid. No gallstones are identified. Sonographic Beverley sign is negative. Common bile duct: Diameter: 4.6 mm. Liver: There is coarse echotexture diffusely with nodular liver contour. Echotexture is diffusely increased. There is a  1.4 x 1.2 x 1.8 cm cystic area in the left lobe of the liver corresponding to CT findings. Portal vein is patent on color Doppler imaging with normal direction of blood flow towards the liver. Other: None. IMPRESSION: 1. Cirrhotic liver morphology. 2. Gallbladder wall is upper limits of normal in thickness at 3 mm. No gallstones or pericholecystic fluid. Sonographic Beverley sign is negative. 3. 1.8 cm cystic area in the left lobe of the liver corresponding to CT findings. Electronically Signed   By: Greig Pique M.D.   On: 05/10/2024 23:04   CT ABDOMEN PELVIS W CONTRAST Result Date: 05/10/2024 CLINICAL DATA:  Acute diarrhea with weakness. EXAM: CT ABDOMEN AND PELVIS WITH CONTRAST TECHNIQUE: Multidetector CT imaging of the abdomen and pelvis was performed using the standard protocol following bolus administration of intravenous contrast. RADIATION DOSE REDUCTION: This exam was performed according to the departmental dose-optimization program which includes automated exposure control, adjustment of the mA and/or kV according to patient size and/or use of iterative reconstruction technique. CONTRAST:  80mL OMNIPAQUE  IOHEXOL  300 MG/ML  SOLN COMPARISON:  CT abdomen 12/15/2009. CT abdomen and pelvis 12/04/2006. FINDINGS: Lower chest: There is new cardiomegaly with small pericardial effusion. There is a small right pleural effusion. Hepatobiliary: There is markedly heterogeneous appearance of liver parenchyma. Liver has nodular contour. This is a new finding. There is a new hypodense lesion in the peripheral right lobe of the liver measuring 14 mm. There is a new hypodense area in the left lobe of the liver measuring 12 mm. There is diffuse gallbladder wall edema. There is no biliary ductal dilatation. Pancreas: Unremarkable. No pancreatic ductal dilatation or surrounding inflammatory changes. Spleen: Normal in size without focal abnormality. Adrenals/Urinary Tract: Adrenal glands are unremarkable. Kidneys are normal,  without renal calculi, focal lesion, or hydronephrosis. Bladder is unremarkable. Stomach/Bowel: Stomach is within normal limits. Appendix appears normal. There are air-fluid levels scattered throughout the colon. There is mild diffuse colonic wall thickening. Small bowel is within normal limits. Proximal stomach is decompressed. There is questionable large diverticula with air-fluid level versus dilated proximal small bowel loop in the right upper quadrant image 2/37. This appears unchanged from multiple prior examinations dating back to 1,008. No pneumatosis or free air. Vascular/Lymphatic: Aorta and IVC are normal in size. There are atherosclerotic calcifications of the aorta. No enlarged lymph nodes are identified. Reproductive: Uterus and bilateral adnexa are unremarkable. Other: There is a small amount of free fluid throughout the abdomen and pelvis. Musculoskeletal: Severe L1 compression fracture is again seen. There is retropulsion of fracture fragments. No acute osseous findings. IMPRESSION: 1. New cardiomegaly with small pericardial effusion. 2. New small right pleural effusion. 3. New heterogeneous appearance of the liver with nodular contour worrisome for cirrhosis. 4. New hypodense lesions in the liver are indeterminate. Recommend further evaluation with MRI. 5. Diffuse gallbladder wall edema. Findings may be related to hepatic dysfunction, but cholecystitis not excluded. 6. Mild diffuse colonic wall thickening with air-fluid levels compatible with colitis. 7. Small amount of free fluid in the abdomen and pelvis.  8. Aortic atherosclerosis. Aortic Atherosclerosis (ICD10-I70.0). Electronically Signed   By: Greig Pique M.D.   On: 05/10/2024 21:47   DG Chest Port 1 View Result Date: 05/01/2024 EXAM: 1 VIEW XRAY OF THE CHEST 05/01/2024 09:59:38 AM COMPARISON: 11/23/2019 CLINICAL HISTORY: Fall, tachypnea. Pt comes in via [facility] after a unwitnessed fall. Pt states that she was walking from her living room to  her bedroom when she fell and hit her head. Pt reports no LOC. Pt is not on blood thinners. Pt has no complaints of pain at this time. Pt is alert and oriented x4. FINDINGS: LUNGS AND PLEURA: No overt pulmonary edema. Trace right pleural effusion and mild right basilar atelectasis. HEART AND MEDIASTINUM: Cardiomegaly. Aortic atherosclerosis. BONES AND SOFT TISSUES: Degenerative change of left shoulder. Elevated right hemidiaphragm. IMPRESSION: 1. Cardiomegaly without overt edema. 2. Elevated right hemidiaphragm, trace right pleural effusion, and mild right basilar atelectasis. Electronically signed by: Selinda Blue MD 05/01/2024 10:12 AM EDT RP Workstation: [id number]   DG Pelvis 1-2 Views Result Date: 05/01/2024 EXAM: 1 or 2 VIEW(S) XRAY OF THE PELVIS 05/01/2024 09:59:38 AM COMPARISON: 04/18/2022 pelvic radiograph CLINICAL HISTORY: Fall injury. Pt comes in via [facility] after an unwitnessed fall. Pt states that she was walking from her living room to her bedroom when she fell and hit her head. Pt reports no LOC. Pt is not on blood thinners. Pt has no complaints of pain at this time. Pt is alert and oriented x4. FINDINGS: BONES AND JOINTS: No acute fracture. No focal osseous lesion. No joint dislocation. SOFT TISSUES: Overlying tubing noted. Vascular calcifications overlie the pelvis. IMPRESSION: 1. No acute findings. Electronically signed by: Selinda Blue MD 05/01/2024 10:10 AM EDT RP Workstation: [id number]   CT Head Wo Contrast Result Date: 05/01/2024 EXAM: CT HEAD AND CERVICAL SPINE 05/01/2024 10:02:34 AM TECHNIQUE: CT of the head and cervical spine was performed without the administration of intravenous contrast. Multiplanar reformatted images are provided for review. Automated exposure control, iterative reconstruction, and/or weight based adjustment of the mA/kV was utilized to reduce the radiation dose to as low as reasonably achievable. COMPARISON: 02/01/2024 CLINICAL HISTORY: Head trauma, minor (Age >= 65y).  unwitnessed fall. Pt states that she was walking from her living room to her bedroom when she fell and hit her head. Pt reports no LOC. Pt is not on blood thinners. FINDINGS: CT HEAD BRAIN AND VENTRICLES: No acute intracranial hemorrhage. No mass effect or midline shift. No abnormal extra-axial fluid collection. No evidence of acute infarct. No hydrocephalus. Stable background of moderate chronic small vessel disease. ORBITS: No acute abnormality. SINUSES AND MASTOIDS: No acute abnormality. SOFT TISSUES AND SKULL: No acute skull fracture. No acute soft tissue abnormality. CT CERVICAL SPINE BONES AND ALIGNMENT: No acute fracture or traumatic malalignment. DEGENERATIVE CHANGES: Multilevel severe disc height loss with 3 mm degenerative anterolisthesis of C3 on C4 and C4 on C5. No high-grade spinal canal stenosis. SOFT TISSUES: No prevertebral soft tissue swelling. VASCULATURE: Atherosclerotic calcifications of the carotid bulbs. IMPRESSION: 1. No acute intracranial abnormality. 2. Stable background of moderate chronic small vessel disease. 3. No acute fracture or traumatic malalignment of the cervical spine. 4. Multilevel severe disc height loss with 3 mm degenerative anterolisthesis of C3 on C4 and C4 on C5. No high-grade spinal canal stenosis. Electronically signed by: Ryan Chess MD 05/01/2024 10:07 AM EDT RP Workstation: [id number]   CT Cervical Spine Wo Contrast Result Date: 05/01/2024 EXAM: CT HEAD AND CERVICAL SPINE 05/01/2024 10:02:34 AM TECHNIQUE: CT of the head and  cervical spine was performed without the administration of intravenous contrast. Multiplanar reformatted images are provided for review. Automated exposure control, iterative reconstruction, and/or weight based adjustment of the mA/kV was utilized to reduce the radiation dose to as low as reasonably achievable. COMPARISON: 02/01/2024 CLINICAL HISTORY: Head trauma, minor (Age >= 65y). unwitnessed fall. Pt states that she was walking from her  living room to her bedroom when she fell and hit her head. Pt reports no LOC. Pt is not on blood thinners. FINDINGS: CT HEAD BRAIN AND VENTRICLES: No acute intracranial hemorrhage. No mass effect or midline shift. No abnormal extra-axial fluid collection. No evidence of acute infarct. No hydrocephalus. Stable background of moderate chronic small vessel disease. ORBITS: No acute abnormality. SINUSES AND MASTOIDS: No acute abnormality. SOFT TISSUES AND SKULL: No acute skull fracture. No acute soft tissue abnormality. CT CERVICAL SPINE BONES AND ALIGNMENT: No acute fracture or traumatic malalignment. DEGENERATIVE CHANGES: Multilevel severe disc height loss with 3 mm degenerative anterolisthesis of C3 on C4 and C4 on C5. No high-grade spinal canal stenosis. SOFT TISSUES: No prevertebral soft tissue swelling. VASCULATURE: Atherosclerotic calcifications of the carotid bulbs. IMPRESSION: 1. No acute intracranial abnormality. 2. Stable background of moderate chronic small vessel disease. 3. No acute fracture or traumatic malalignment of the cervical spine. 4. Multilevel severe disc height loss with 3 mm degenerative anterolisthesis of C3 on C4 and C4 on C5. No high-grade spinal canal stenosis. Electronically signed by: Ryan Chess MD 05/01/2024 10:07 AM EDT RP Workstation: [id number]    Microbiology: Results for orders placed or performed during the hospital encounter of 05/10/24  Resp panel by RT-PCR (RSV, Flu A&B, Covid) Anterior Nasal Swab     Status: None   Collection Time: 05/10/24  3:56 PM   Specimen: Anterior Nasal Swab  Result Value Ref Range Status   SARS Coronavirus 2 by RT PCR NEGATIVE NEGATIVE Final    Comment: (NOTE) SARS-CoV-2 target nucleic acids are NOT DETECTED.  The SARS-CoV-2 RNA is generally detectable in upper respiratory specimens during the acute phase of infection. The lowest concentration of SARS-CoV-2 viral copies this assay can detect is 138 copies/mL. A negative result does not  preclude SARS-Cov-2 infection and should not be used as the sole basis for treatment or other patient management decisions. A negative result may occur with  improper specimen collection/handling, submission of specimen other than nasopharyngeal swab, presence of viral mutation(s) within the areas targeted by this assay, and inadequate number of viral copies(<138 copies/mL). A negative result must be combined with clinical observations, patient history, and epidemiological information. The expected result is Negative.  Fact Sheet for Patients:  BloggerCourse.com  Fact Sheet for Healthcare Providers:  SeriousBroker.it  This test is no t yet approved or cleared by the [place]  FDA and  has been authorized for detection and/or diagnosis of SARS-CoV-2 by FDA under an Emergency Use Authorization (EUA). This EUA will remain  in effect (meaning this test can be used) for the duration of the COVID-19 declaration under Section 564(b)(1) of the Act, 21 U.S.C.section 360bbb-3(b)(1), unless the authorization is terminated  or revoked sooner.       Influenza A by PCR NEGATIVE NEGATIVE Final   Influenza B by PCR NEGATIVE NEGATIVE Final    Comment: (NOTE) The Xpert Xpress SARS-CoV-2/FLU/RSV plus assay is intended as an aid in the diagnosis of influenza from Nasopharyngeal swab specimens and should not be used as a sole basis for treatment. Nasal washings and aspirates are unacceptable for Xpert Xpress SARS-CoV-2/FLU/RSV  testing.  Fact Sheet for Patients: BloggerCourse.com  Fact Sheet for Healthcare Providers: SeriousBroker.it  This test is not yet approved or cleared by the [place]  FDA and has been authorized for detection and/or diagnosis of SARS-CoV-2 by FDA under an Emergency Use Authorization (EUA). This EUA will remain in effect (meaning this test can be used) for the  duration of the COVID-19 declaration under Section 564(b)(1) of the Act, 21 U.S.C. section 360bbb-3(b)(1), unless the authorization is terminated or revoked.     Resp Syncytial Virus by PCR NEGATIVE NEGATIVE Final    Comment: (NOTE) Fact Sheet for Patients: BloggerCourse.com  Fact Sheet for Healthcare Providers: SeriousBroker.it  This test is not yet approved or cleared by the [place]  FDA and has been authorized for detection and/or diagnosis of SARS-CoV-2 by FDA under an Emergency Use Authorization (EUA). This EUA will remain in effect (meaning this test can be used) for the duration of the COVID-19 declaration under Section 564(b)(1) of the Act, 21 U.S.C. section 360bbb-3(b)(1), unless the authorization is terminated or revoked.  Performed at [facility], [address] [place], [place] [address]   C Difficile Quick Screen w PCR reflex     Status: None   Collection Time: 05/13/24 12:45 AM   Specimen: STOOL  Result Value Ref Range Status   C Diff antigen NEGATIVE NEGATIVE Final   C Diff toxin NEGATIVE NEGATIVE Final   C Diff interpretation No C. difficile detected.  Final    Comment: Performed at [facility], [address] [place], [place] [address]  Gastrointestinal Panel by PCR , Stool     Status: Abnormal   Collection Time: 05/13/24 12:45 AM   Specimen: Stool  Result Value Ref Range Status   Campylobacter species NOT DETECTED NOT DETECTED Final   Plesimonas shigelloides NOT DETECTED NOT DETECTED Final   Salmonella species NOT DETECTED NOT DETECTED Final   Yersinia enterocolitica NOT DETECTED NOT DETECTED Final   Vibrio species NOT DETECTED NOT DETECTED Final   Vibrio cholerae NOT DETECTED NOT DETECTED Final   Enteroaggregative E coli (EAEC) NOT DETECTED NOT DETECTED Final   Enteropathogenic E coli (EPEC) DETECTED (A) NOT DETECTED Final    Comment: RESULT CALLED TO, READ BACK BY AND  VERIFIED WITH: DEMEISHA ROBERSON RN @0217  05/13/24 ASW    Enterotoxigenic E coli (ETEC) NOT DETECTED NOT DETECTED Final   Shiga like toxin producing E coli (STEC) NOT DETECTED NOT DETECTED Final   Shigella/Enteroinvasive E coli (EIEC) NOT DETECTED NOT DETECTED Final   Cryptosporidium NOT DETECTED NOT DETECTED Final   Cyclospora cayetanensis NOT DETECTED NOT DETECTED Final   Entamoeba histolytica NOT DETECTED NOT DETECTED Final   Giardia lamblia NOT DETECTED NOT DETECTED Final   Adenovirus F40/41 NOT DETECTED NOT DETECTED Final   Astrovirus NOT DETECTED NOT DETECTED Final   Norovirus GI/GII NOT DETECTED NOT DETECTED Final   Rotavirus A NOT DETECTED NOT DETECTED Final   Sapovirus (I, II, IV, and V) NOT DETECTED NOT DETECTED Final    Comment: Performed at [facility], [address] [place], [place] [address]    Labs: CBC: Recent Labs  Lab 05/11/24 0437 05/12/24 0540 05/13/24 0424 05/14/24 0434 05/15/24 0651  WBC 6.8 5.3 4.2 3.8* 3.9*  HGB 10.8* 11.0* 10.2* 9.8* 10.6*  HCT 32.9* 32.2* 29.6* 29.5* 32.5*  MCV 102.2* 100.0 100.3* 101.4* 102.2*  PLT 121* 123* 127* 148* 167   Basic Metabolic Panel: Recent Labs  Lab 05/10/24 1556 05/11/24 0437 05/12/24 0540 05/13/24  0424 05/14/24 0434 05/15/24 0651  NA 137 139 137 139 137 140  K 3.3* 3.8 3.3* 3.0* 4.1 4.2  CL 106 103 105 106 110 107  CO2 20* 19* 23 24 22 25   GLUCOSE 97 83 68* 98 90 83  BUN 16 12 11 10 8 8   CREATININE 1.12* 0.72 0.57 0.81 0.54 0.63  CALCIUM  8.4* 8.1* 7.8* 7.6* 8.1* 8.8*  MG 1.7  --   --  1.3* 2.5*  --    Liver Function Tests: Recent Labs  Lab 05/10/24 1556  AST 23  ALT 11  ALKPHOS 67  BILITOT 1.1  PROT 7.4  ALBUMIN 3.8   CBG: Recent Labs  Lab 05/12/24 0945  GLUCAP 100*    Discharge time spent: greater than 30 minutes.  This record has been created using Conservation officer, historic buildings. Errors have been sought and corrected,but may not always be located. Such creation errors  do not reflect on the standard of care.   Signed: Amaryllis Dare, MD [facility] 05/15/2024

## 2024-05-15 NOTE — TOC Transition Note (Signed)
 Transition of Care [facility]) - Discharge Note   Patient Details  Name: Yvette [name] MRN: [id number] Date of Birth: 1924-08-23  Transition of Care [facility]) CM/SW Contact:  Alvaro Louder, LCSW Phone Number: 05/15/2024, 12:19 PM   Clinical Narrative:     LCSWA received insurance approval for patient to admit to SNF [facility]. LCSWA confirmed with MD that patient is stable for discharge. LCSWA notified the patient and Randall and they are in agreement with discharge. LCSWA confirmed bed is available at SNF. Transport arranged with [facility] for next available.  Number to call report: [phone], [phone]   TOC signing off Final next level of care: Skilled Nursing Facility Barriers to Discharge: No Barriers Identified   Patient Goals and CMS Choice            Discharge Placement              Patient chooses bed at: [facility]  Patient to be transferred to facility by: [facility] Name of family member notified: Randall Patient and family notified of of transfer: 05/15/24  Discharge Plan and Services Additional resources added to the After Visit Summary for                                       Social Drivers of Health (SDOH) Interventions SDOH Screenings   Food Insecurity: Patient Unable To Answer (05/11/2024)  Housing: Patient Unable To Answer (05/11/2024)  Transportation Needs: Patient Unable To Answer (05/11/2024)  Utilities: Patient Unable To Answer (05/11/2024)  Social Connections: Patient Unable To Answer (05/11/2024)  Tobacco Use: Low Risk  (05/12/2024)     Readmission Risk Interventions     No data to display
